# Patient Record
Sex: Male | Born: 1950 | Race: White | Hispanic: No | Marital: Single | Smoking: Former smoker
Health system: Southern US, Community
[De-identification: ages and names within clinical notes are randomized; demographics above are authoritative.]

## PROBLEM LIST (undated history)

## (undated) DIAGNOSIS — I739 Peripheral vascular disease, unspecified: Secondary | ICD-10-CM

## (undated) DIAGNOSIS — E1140 Type 2 diabetes mellitus with diabetic neuropathy, unspecified: Secondary | ICD-10-CM

## (undated) DIAGNOSIS — I10 Essential (primary) hypertension: Secondary | ICD-10-CM

---

## 2013-06-08 ENCOUNTER — Ambulatory Visit: Payer: Self-pay | Admitting: Internal Medicine

## 2013-06-11 ENCOUNTER — Emergency Department: Payer: Self-pay | Admitting: Emergency Medicine

## 2013-06-21 ENCOUNTER — Emergency Department: Payer: Self-pay | Admitting: Emergency Medicine

## 2013-06-26 ENCOUNTER — Encounter: Payer: Self-pay | Admitting: Surgery

## 2013-07-16 ENCOUNTER — Ambulatory Visit: Payer: Self-pay

## 2013-07-18 ENCOUNTER — Encounter: Payer: Self-pay | Admitting: Surgery

## 2013-08-02 ENCOUNTER — Ambulatory Visit: Payer: Self-pay | Admitting: Podiatry

## 2013-08-02 DIAGNOSIS — I10 Essential (primary) hypertension: Secondary | ICD-10-CM

## 2013-08-02 DIAGNOSIS — I2510 Atherosclerotic heart disease of native coronary artery without angina pectoris: Secondary | ICD-10-CM

## 2013-08-03 ENCOUNTER — Ambulatory Visit: Payer: Self-pay | Admitting: Podiatry

## 2013-12-25 ENCOUNTER — Ambulatory Visit: Payer: Self-pay | Admitting: Podiatry

## 2013-12-25 LAB — BASIC METABOLIC PANEL
Anion Gap: 3 — ABNORMAL LOW (ref 7–16)
BUN: 21 mg/dL — ABNORMAL HIGH (ref 7–18)
Calcium, Total: 8.4 mg/dL — ABNORMAL LOW (ref 8.5–10.1)
Chloride: 107 mmol/L (ref 98–107)
Co2: 30 mmol/L (ref 21–32)
Creatinine: 0.71 mg/dL (ref 0.60–1.30)
EGFR (African American): >60
EGFR (Non-African Amer.): >60
Glucose: 269 mg/dL — ABNORMAL HIGH (ref 65–99)
Osmolality: 292 (ref 275–301)
Potassium: 3.9 mmol/L (ref 3.5–5.1)
Sodium: 140 mmol/L (ref 136–145)

## 2013-12-25 LAB — CBC WITH DIFFERENTIAL/PLATELET
Basophil #: 0 10*3/uL (ref 0.0–0.1)
Basophil %: 0.4 %
Eosinophil #: 0.2 10*3/uL (ref 0.0–0.7)
Eosinophil %: 1.8 %
HCT: 43.7 % (ref 40.0–52.0)
HGB: 14.2 g/dL (ref 13.0–18.0)
Lymphocyte #: 1.3 10*3/uL (ref 1.0–3.6)
Lymphocyte %: 15.1 %
MCH: 31.7 pg (ref 26.0–34.0)
MCHC: 32.6 g/dL (ref 32.0–36.0)
MCV: 97 fL (ref 80–100)
Monocyte #: 0.7 x10 3/mm (ref 0.2–1.0)
Monocyte %: 8.3 %
Neutrophil #: 6.5 10*3/uL (ref 1.4–6.5)
Neutrophil %: 74.4 %
Platelet: 210 10*3/uL (ref 150–440)
RBC: 4.5 10*6/uL (ref 4.40–5.90)
RDW: 14.2 % (ref 11.5–14.5)
WBC: 8.8 10*3/uL (ref 3.8–10.6)

## 2013-12-28 ENCOUNTER — Ambulatory Visit: Payer: Self-pay | Admitting: Podiatry

## 2014-05-11 NOTE — Op Note (Signed)
PATIENT NAME:  Johnny Lane, Chrishaun MR#:  [id number] DATE OF BIRTH:  [birth year]  DATE OF PROCEDURE:  08/03/2013  PREOPERATIVE DIAGNOSIS: Right heel ulcer to bone.   POSTOPERATIVE DIAGNOSIS: Right heel ulcer to bone.   PROCEDURES: 1.  Excisional debridement, right heel ulcer to bone.  2.  Placement of wound VAC, right heel.   SURGEON: Deantae Shackleton A. Ether GriffinsFowler, DPM  ANESTHESIA: IV sedation with local.   HEMOSTASIS: Epinephrine 1:100,000 infiltrated along the wound site.   COMPLICATIONS: None.   SPECIMEN: None.   ESTIMATED BLOOD LOSS: Minimal.   OPERATIVE INDICATIONS: This is a 64 year old gentleman who has developed a right posterior lateral heel ulceration. He has undergone conservative treatment and presents today for surgical debridement. All risks, benefits, alternatives and complications associated with the surgery were discussed with the patient and full informed consent has been given.   OPERATIVE PROCEDURE: The patient was brought into the OR and placed on the operating table in the prone position. IV sedation was administered by the anesthesia team. The right lower extremity was then prepped and draped in the usual sterile fashion. Attention was directed to the posterior aspect of the right lateral heel plantarly where initially the skin was debrided with a 15 blade to remove all the hyperkeratotic tissue. Next, a VersaJet tool was used to full-thickness debride down to the heel. This was taken down to good healthy bleeding tissue. A slight amount of bone was palpable in the most central distal aspect of the debridement site. The wound measured 4 cm x 1.75 cm x 1.2 cm. Minimal bleeding was noted. Afterwards the wound bed was covered with a wound VAC at 125 mmHg. He will be discharged home with the wound VAC. Home health is set up to perform wound VAC changes. He understands to be partial weight-bearing to his toes only. I will see him back in the outpatient clinic in approximately 2 weeks.     ____________________________ Argentina DonovanJustin A. Ether GriffinsFowler, DPM jaf:sb D: 08/03/2013 08:13:47 ET T: 08/03/2013 09:01:45 ET JOB#: [id number]  cc: Jill AlexandersJustin A. Ether GriffinsFowler, DPM, <Dictator> Naeema Patlan DPM ELECTRONICALLY SIGNED 08/17/2013 7:43

## 2014-05-11 NOTE — Op Note (Signed)
PATIENT NAME:  Johnny Lane, Frantz MR#:  [id number] DATE OF BIRTH:  1950/08/04  DATE OF PROCEDURE:  12/28/2013  PREOPERATIVE DIAGNOSIS: Nonhealing diabetic foot ulcer, right lateral heel, to muscle and subcutaneous tissue.   POSTOPERATIVE DIAGNOSIS: Nonhealing diabetic foot ulcer, right lateral heel, to muscle and subcutaneous tissue.   PROCEDURE PERFORMED: 1.  Debridement of right heel to subcutaneous tissue and muscle with VersaJet.  2.  Placement of soft tissue expander for assistance with wound closure.   SURGEON: Kota Ciancio A. Ether GriffinsFowler, DPM  ANESTHESIA: IV sedation.   HEMOSTASIS: Epinephrine 1:100,000 infiltrated along the incisions and ulcer site.   COMPLICATIONS: None.   SPECIMEN: None.   ESTIMATED BLOOD LOSS: Minimal.   OPERATIVE INDICATIONS: This is a 64 year old gentleman who has had a chronic nonhealing right lateral ulcer to his heel for over 6 months now. He has undergone extensive conservative treatment and presents today for surgical debridement and placement of soft tissue expander for wound closure. The risks, benefits, alternatives, and complications associated with the procedure were discussed with the patient and full consent has been given.   OPERATIVE PROCEDURE: The patient was brought into the OR and placed on the operating table in the supine position. IV sedation was administered and the patient was placed in a lateral decubitus position. All areas were padded up appropriately. The right lower extremity was then prepped and draped in the usual sterile fashion. Attention was directed to the right posterior lateral heel where a full-thickness ulcer was noted. The ulcer measured 1.8 x 1.4 cm. It was full thickness, approximately 1.5 cm in depth, as well. At this time, the wound was sharply debrided down to muscle with the use of a VersaJet. This was taken down to good bleeding tissue. No signs of infection were noted. There was no exposure of bone at this point. After debridement  the soft tissue expander was then applied. This was a DermaClose RC soft tissue expander. Three prongs were used on both sides of the wound and the wound closure device was applied in the normal weaving pattern. This was tensioned appropriately to manufacture standards. The DermaClose device was then placed to the anterolateral aspect of the right heel. A padded dressing was applied around all areas. The DermaClose device was sutured to the lateral skin. A well compressive sterile bulky dressing was applied to the right lateral foot. He will be discharged with strict orders of nonweightbearing. We will see him back in approximately 1 week to remove the tissue expander and perform primary closure in the office.  ____________________________ Argentina DonovanJustin A. Ether GriffinsFowler, DPM jaf:sb D: 12/28/2013 08:22:41 ET T: 12/28/2013 10:39:22 ET JOB#: [id number]  cc: Jill AlexandersJustin A. Ether GriffinsFowler, DPM, <Dictator> Renate Danh DPM ELECTRONICALLY SIGNED 01/03/2014 13:07

## 2014-09-04 ENCOUNTER — Ambulatory Visit: Payer: Medicare Other | Admitting: Anesthesiology

## 2015-01-08 ENCOUNTER — Inpatient Hospital Stay: Payer: Medicare Other

## 2015-01-08 ENCOUNTER — Emergency Department: Payer: Medicare Other

## 2015-01-08 ENCOUNTER — Inpatient Hospital Stay
Admission: EM | Admit: 2015-01-08 | Discharge: 2015-01-14 | DRG: 253 | Disposition: A | Discharge status: Skilled Nursing Facility | Payer: Medicare Other | Attending: Internal Medicine | Admitting: Internal Medicine

## 2015-01-08 ENCOUNTER — Encounter: Payer: Self-pay | Admitting: *Deleted

## 2015-01-08 DIAGNOSIS — E11649 Type 2 diabetes mellitus with hypoglycemia without coma: Secondary | ICD-10-CM | POA: Diagnosis present

## 2015-01-08 DIAGNOSIS — E1140 Type 2 diabetes mellitus with diabetic neuropathy, unspecified: Secondary | ICD-10-CM | POA: Diagnosis not present

## 2015-01-08 DIAGNOSIS — E11621 Type 2 diabetes mellitus with foot ulcer: Secondary | ICD-10-CM | POA: Diagnosis not present

## 2015-01-08 DIAGNOSIS — I2510 Atherosclerotic heart disease of native coronary artery without angina pectoris: Secondary | ICD-10-CM | POA: Diagnosis not present

## 2015-01-08 DIAGNOSIS — R739 Hyperglycemia, unspecified: Secondary | ICD-10-CM | POA: Diagnosis present

## 2015-01-08 DIAGNOSIS — L8990 Pressure ulcer of unspecified site, unspecified stage: Secondary | ICD-10-CM | POA: Insufficient documentation

## 2015-01-08 DIAGNOSIS — L03116 Cellulitis of left lower limb: Secondary | ICD-10-CM | POA: Diagnosis not present

## 2015-01-08 DIAGNOSIS — M170 Bilateral primary osteoarthritis of knee: Secondary | ICD-10-CM | POA: Diagnosis present

## 2015-01-08 DIAGNOSIS — I701 Atherosclerosis of renal artery: Secondary | ICD-10-CM | POA: Diagnosis present

## 2015-01-08 DIAGNOSIS — Z23 Encounter for immunization: Secondary | ICD-10-CM | POA: Diagnosis not present

## 2015-01-08 DIAGNOSIS — N179 Acute kidney failure, unspecified: Secondary | ICD-10-CM | POA: Diagnosis not present

## 2015-01-08 DIAGNOSIS — M19041 Primary osteoarthritis, right hand: Secondary | ICD-10-CM | POA: Diagnosis present

## 2015-01-08 DIAGNOSIS — E1165 Type 2 diabetes mellitus with hyperglycemia: Secondary | ICD-10-CM | POA: Diagnosis not present

## 2015-01-08 DIAGNOSIS — Z791 Long term (current) use of non-steroidal anti-inflammatories (NSAID): Secondary | ICD-10-CM

## 2015-01-08 DIAGNOSIS — E875 Hyperkalemia: Secondary | ICD-10-CM | POA: Diagnosis not present

## 2015-01-08 DIAGNOSIS — R0602 Shortness of breath: Secondary | ICD-10-CM

## 2015-01-08 DIAGNOSIS — L97419 Non-pressure chronic ulcer of right heel and midfoot with unspecified severity: Secondary | ICD-10-CM | POA: Diagnosis not present

## 2015-01-08 DIAGNOSIS — E1122 Type 2 diabetes mellitus with diabetic chronic kidney disease: Secondary | ICD-10-CM | POA: Diagnosis present

## 2015-01-08 DIAGNOSIS — I255 Ischemic cardiomyopathy: Secondary | ICD-10-CM | POA: Diagnosis present

## 2015-01-08 DIAGNOSIS — I129 Hypertensive chronic kidney disease with stage 1 through stage 4 chronic kidney disease, or unspecified chronic kidney disease: Secondary | ICD-10-CM | POA: Diagnosis present

## 2015-01-08 DIAGNOSIS — N189 Chronic kidney disease, unspecified: Secondary | ICD-10-CM | POA: Diagnosis not present

## 2015-01-08 DIAGNOSIS — J189 Pneumonia, unspecified organism: Secondary | ICD-10-CM | POA: Diagnosis present

## 2015-01-08 DIAGNOSIS — E876 Hypokalemia: Secondary | ICD-10-CM | POA: Diagnosis not present

## 2015-01-08 DIAGNOSIS — E871 Hypo-osmolality and hyponatremia: Secondary | ICD-10-CM | POA: Diagnosis present

## 2015-01-08 DIAGNOSIS — E559 Vitamin D deficiency, unspecified: Secondary | ICD-10-CM | POA: Diagnosis not present

## 2015-01-08 DIAGNOSIS — L03115 Cellulitis of right lower limb: Secondary | ICD-10-CM | POA: Diagnosis not present

## 2015-01-08 DIAGNOSIS — R809 Proteinuria, unspecified: Secondary | ICD-10-CM | POA: Diagnosis present

## 2015-01-08 DIAGNOSIS — Z7982 Long term (current) use of aspirin: Secondary | ICD-10-CM | POA: Diagnosis not present

## 2015-01-08 DIAGNOSIS — M19042 Primary osteoarthritis, left hand: Secondary | ICD-10-CM | POA: Diagnosis not present

## 2015-01-08 DIAGNOSIS — Z7902 Long term (current) use of antithrombotics/antiplatelets: Secondary | ICD-10-CM | POA: Diagnosis not present

## 2015-01-08 DIAGNOSIS — R0902 Hypoxemia: Secondary | ICD-10-CM

## 2015-01-08 DIAGNOSIS — Z8249 Family history of ischemic heart disease and other diseases of the circulatory system: Secondary | ICD-10-CM

## 2015-01-08 DIAGNOSIS — E1152 Type 2 diabetes mellitus with diabetic peripheral angiopathy with gangrene: Principal | ICD-10-CM | POA: Diagnosis present

## 2015-01-08 DIAGNOSIS — Z794 Long term (current) use of insulin: Secondary | ICD-10-CM | POA: Diagnosis not present

## 2015-01-08 DIAGNOSIS — L0390 Cellulitis, unspecified: Secondary | ICD-10-CM | POA: Diagnosis present

## 2015-01-08 DIAGNOSIS — F17210 Nicotine dependence, cigarettes, uncomplicated: Secondary | ICD-10-CM | POA: Diagnosis not present

## 2015-01-08 HISTORY — DX: Essential (primary) hypertension: I10

## 2015-01-08 HISTORY — DX: Peripheral vascular disease, unspecified: I73.9

## 2015-01-08 HISTORY — DX: Type 2 diabetes mellitus with diabetic neuropathy, unspecified: E11.40

## 2015-01-08 LAB — GLUCOSE, CAPILLARY
GLUCOSE-CAPILLARY: 325 mg/dL — AB (ref 65–99)
GLUCOSE-CAPILLARY: 156 mg/dL — AB (ref 65–99)
GLUCOSE-CAPILLARY: 157 mg/dL — AB (ref 65–99)
GLUCOSE-CAPILLARY: 148 mg/dL — AB (ref 65–99)
Glucose-Capillary: 595 mg/dL (ref 65–99)
Glucose-Capillary: 527 mg/dL — ABNORMAL HIGH (ref 65–99)
Glucose-Capillary: 388 mg/dL — ABNORMAL HIGH (ref 65–99)
Glucose-Capillary: 233 mg/dL — ABNORMAL HIGH (ref 65–99)
Glucose-Capillary: 169 mg/dL — ABNORMAL HIGH (ref 65–99)
Glucose-Capillary: 205 mg/dL — ABNORMAL HIGH (ref 65–99)
Glucose-Capillary: 200 mg/dL — ABNORMAL HIGH (ref 65–99)

## 2015-01-08 LAB — CBC WITH DIFFERENTIAL/PLATELET
BASOS PCT: 0 %
Basophils Absolute: 0 10*3/uL (ref 0–0.1)
Eosinophils Absolute: 0 10*3/uL (ref 0–0.7)
Eosinophils Relative: 0 %
HEMATOCRIT: 42.9 % (ref 40.0–52.0)
HEMOGLOBIN: 13.5 g/dL (ref 13.0–18.0)
LYMPHS ABS: 0.4 10*3/uL — AB (ref 1.0–3.6)
LYMPHS PCT: 2 %
MCH: 29.9 pg (ref 26.0–34.0)
MCHC: 31.5 g/dL — AB (ref 32.0–36.0)
MCV: 95.1 fL (ref 80.0–100.0)
MONO ABS: 1.3 10*3/uL — AB (ref 0.2–1.0)
MONOS PCT: 6 %
NEUTROS ABS: 20.7 10*3/uL — AB (ref 1.4–6.5)
NEUTROS PCT: 92 %
Platelets: 296 10*3/uL (ref 150–440)
RBC: 4.51 MIL/uL (ref 4.40–5.90)
RDW: 14.3 % (ref 11.5–14.5)
WBC: 22.5 10*3/uL — ABNORMAL HIGH (ref 3.8–10.6)

## 2015-01-08 LAB — COMPREHENSIVE METABOLIC PANEL
ALK PHOS: 127 U/L — AB (ref 38–126)
ALT: 14 U/L — ABNORMAL LOW (ref 17–63)
ANION GAP: 11 (ref 5–15)
AST: 14 U/L — ABNORMAL LOW (ref 15–41)
Albumin: 2.2 g/dL — ABNORMAL LOW (ref 3.5–5.0)
BILIRUBIN TOTAL: 2.1 mg/dL — AB (ref 0.3–1.2)
BUN: 19 mg/dL (ref 6–20)
CO2: 30 mmol/L (ref 22–32)
Calcium: 7.2 mg/dL — ABNORMAL LOW (ref 8.9–10.3)
Chloride: 87 mmol/L — ABNORMAL LOW (ref 101–111)
Creatinine, Ser: 0.91 mg/dL (ref 0.61–1.24)
GFR calc Af Amer: >60 mL/min (ref >60)
GLUCOSE: 833 mg/dL — AB (ref 65–99)
Potassium: 5 mmol/L (ref 3.5–5.1)
Sodium: 128 mmol/L — ABNORMAL LOW (ref 135–145)
Total Protein: 6 g/dL — ABNORMAL LOW (ref 6.5–8.1)

## 2015-01-08 LAB — ETHANOL: Alcohol, Ethyl (B): <5 mg/dL (ref <5)

## 2015-01-08 LAB — MRSA PCR SCREENING: MRSA by PCR: NEGATIVE

## 2015-01-08 LAB — HEMOGLOBIN A1C: Hgb A1c MFr Bld: 13.8 % — ABNORMAL HIGH (ref 4.0–6.0)

## 2015-01-08 MED ORDER — SODIUM CHLORIDE 0.9 % IV SOLN
Freq: Once | INTRAVENOUS | Status: AC
  Administered 2015-01-08: 12:00:00 via INTRAVENOUS

## 2015-01-08 MED ORDER — INSULIN ASPART 100 UNIT/ML ~~LOC~~ SOLN: 0.0000 [IU] | Freq: Every day | SUBCUTANEOUS | Status: DC

## 2015-01-08 MED ORDER — SENNOSIDES-DOCUSATE SODIUM 8.6-50 MG PO TABS
1.0000 | ORAL_TABLET | Freq: Every day | ORAL | Status: DC
  Administered 2015-01-08 – 2015-01-14 (×7): 1 via ORAL
  Filled 2015-01-08 (×7): qty 1

## 2015-01-08 MED ORDER — ENOXAPARIN SODIUM 40 MG/0.4ML ~~LOC~~ SOLN
40.0000 mg | SUBCUTANEOUS | Status: DC
  Administered 2015-01-08: 40 mg via SUBCUTANEOUS
  Filled 2015-01-08: qty 0.4

## 2015-01-08 MED ORDER — INFLUENZA VAC SPLIT QUAD 0.5 ML IM SUSY
0.5000 mL | PREFILLED_SYRINGE | INTRAMUSCULAR | Status: AC
  Administered 2015-01-09: 0.5 mL via INTRAMUSCULAR
  Filled 2015-01-08: qty 0.5

## 2015-01-08 MED ORDER — ADULT MULTIVITAMIN W/MINERALS CH
1.0000 | ORAL_TABLET | Freq: Every day | ORAL | Status: DC
  Administered 2015-01-08 – 2015-01-14 (×7): 1 via ORAL
  Filled 2015-01-08 (×7): qty 1

## 2015-01-08 MED ORDER — VITAMIN B-1 100 MG PO TABS
100.0000 mg | ORAL_TABLET | Freq: Every day | ORAL | Status: DC
  Administered 2015-01-08 – 2015-01-14 (×7): 100 mg via ORAL
  Filled 2015-01-08 (×7): qty 1

## 2015-01-08 MED ORDER — ONDANSETRON HCL 4 MG/2ML IJ SOLN
4.0000 mg | Freq: Four times a day (QID) | INTRAMUSCULAR | Status: DC | PRN
Start: 2015-01-08 — End: 2015-01-14

## 2015-01-08 MED ORDER — PIPERACILLIN-TAZOBACTAM 3.375 G IVPB
3.3750 g | Freq: Once | INTRAVENOUS | Status: AC
  Administered 2015-01-08: 3.375 g via INTRAVENOUS
  Filled 2015-01-08: qty 50

## 2015-01-08 MED ORDER — SODIUM CHLORIDE 0.9 % IV BOLUS (SEPSIS): 500.0000 mL | Freq: Once | INTRAVENOUS | Status: DC

## 2015-01-08 MED ORDER — ASPIRIN EC 81 MG PO TBEC
81.0000 mg | DELAYED_RELEASE_TABLET | Freq: Every day | ORAL | Status: DC
  Administered 2015-01-08 – 2015-01-09 (×2): 81 mg via ORAL
  Filled 2015-01-08 (×2): qty 1

## 2015-01-08 MED ORDER — ACETAMINOPHEN 650 MG RE SUPP: 650.0000 mg | Freq: Four times a day (QID) | RECTAL | Status: DC | PRN

## 2015-01-08 MED ORDER — CETYLPYRIDINIUM CHLORIDE 0.05 % MT LIQD
7.0000 mL | Freq: Two times a day (BID) | OROMUCOSAL | Status: DC
  Administered 2015-01-08 – 2015-01-14 (×12): 7 mL via OROMUCOSAL

## 2015-01-08 MED ORDER — SODIUM CHLORIDE 0.9 % IV SOLN
INTRAVENOUS | Status: DC
  Administered 2015-01-08: 5.4 [IU]/h via INTRAVENOUS
  Filled 2015-01-08: qty 2.5

## 2015-01-08 MED ORDER — VANCOMYCIN HCL IN DEXTROSE 1-5 GM/200ML-% IV SOLN
1000.0000 mg | Freq: Once | INTRAVENOUS | Status: AC
  Administered 2015-01-08: 1000 mg via INTRAVENOUS
  Filled 2015-01-08: qty 200

## 2015-01-08 MED ORDER — FOLIC ACID 1 MG PO TABS
1.0000 mg | ORAL_TABLET | Freq: Every day | ORAL | Status: DC
  Administered 2015-01-08 – 2015-01-14 (×7): 1 mg via ORAL
  Filled 2015-01-08 (×7): qty 1

## 2015-01-08 MED ORDER — VANCOMYCIN HCL IN DEXTROSE 1-5 GM/200ML-% IV SOLN
1000.0000 mg | Freq: Two times a day (BID) | INTRAVENOUS | Status: DC
  Administered 2015-01-08 – 2015-01-09 (×2): 1000 mg via INTRAVENOUS
  Filled 2015-01-08 (×4): qty 200

## 2015-01-08 MED ORDER — ALUM & MAG HYDROXIDE-SIMETH 200-200-20 MG/5ML PO SUSP: 30.0000 mL | Freq: Four times a day (QID) | ORAL | Status: DC | PRN

## 2015-01-08 MED ORDER — THIAMINE HCL 100 MG/ML IJ SOLN: 100.0000 mg | Freq: Every day | INTRAMUSCULAR | Status: DC

## 2015-01-08 MED ORDER — SODIUM CHLORIDE 0.9 % IV BOLUS (SEPSIS)
1000.0000 mL | Freq: Once | INTRAVENOUS | Status: AC
  Administered 2015-01-08: 1000 mL via INTRAVENOUS

## 2015-01-08 MED ORDER — LORAZEPAM 1 MG PO TABS: 1.0000 mg | ORAL_TABLET | Freq: Four times a day (QID) | ORAL | Status: AC | PRN

## 2015-01-08 MED ORDER — LORAZEPAM 2 MG PO TABS: 0.0000 mg | ORAL_TABLET | Freq: Two times a day (BID) | ORAL | Status: DC

## 2015-01-08 MED ORDER — INSULIN ASPART 100 UNIT/ML ~~LOC~~ SOLN
10.0000 [IU] | Freq: Once | SUBCUTANEOUS | Status: AC
  Administered 2015-01-08: 10 [IU] via INTRAVENOUS
  Filled 2015-01-08: qty 10

## 2015-01-08 MED ORDER — SODIUM CHLORIDE 0.9 % IV SOLN
INTRAVENOUS | Status: DC
  Administered 2015-01-08 – 2015-01-12 (×7): via INTRAVENOUS

## 2015-01-08 MED ORDER — GABAPENTIN 300 MG PO CAPS
300.0000 mg | ORAL_CAPSULE | Freq: Three times a day (TID) | ORAL | Status: DC
  Administered 2015-01-08 – 2015-01-12 (×12): 300 mg via ORAL
  Filled 2015-01-08 (×13): qty 1

## 2015-01-08 MED ORDER — MENTHOL 3 MG MT LOZG
1.0000 | LOZENGE | OROMUCOSAL | Status: DC | PRN
  Filled 2015-01-08: qty 9

## 2015-01-08 MED ORDER — INSULIN ASPART 100 UNIT/ML ~~LOC~~ SOLN: 0.0000 [IU] | Freq: Three times a day (TID) | SUBCUTANEOUS | Status: DC

## 2015-01-08 MED ORDER — LORAZEPAM 2 MG PO TABS: 0.0000 mg | ORAL_TABLET | Freq: Four times a day (QID) | ORAL | Status: DC

## 2015-01-08 MED ORDER — MORPHINE SULFATE (PF) 2 MG/ML IV SOLN: 1.0000 mg | INTRAVENOUS | Status: DC | PRN

## 2015-01-08 MED ORDER — METFORMIN HCL 500 MG PO TABS
1000.0000 mg | ORAL_TABLET | Freq: Every day | ORAL | Status: DC
  Administered 2015-01-09: 1000 mg via ORAL
  Filled 2015-01-08: qty 2

## 2015-01-08 MED ORDER — GLIPIZIDE 5 MG PO TABS
10.0000 mg | ORAL_TABLET | Freq: Two times a day (BID) | ORAL | Status: DC
  Administered 2015-01-08 – 2015-01-09 (×2): 10 mg via ORAL
  Filled 2015-01-08 (×2): qty 2

## 2015-01-08 MED ORDER — INSULIN GLARGINE 100 UNIT/ML ~~LOC~~ SOLN
60.0000 [IU] | Freq: Every day | SUBCUTANEOUS | Status: DC
  Administered 2015-01-08 – 2015-01-11 (×4): 60 [IU] via SUBCUTANEOUS
  Filled 2015-01-08 (×7): qty 0.6

## 2015-01-08 MED ORDER — LORAZEPAM 2 MG/ML IJ SOLN: 1.0000 mg | Freq: Four times a day (QID) | INTRAMUSCULAR | Status: AC | PRN

## 2015-01-08 MED ORDER — LISINOPRIL 10 MG PO TABS
10.0000 mg | ORAL_TABLET | Freq: Every day | ORAL | Status: DC
  Administered 2015-01-08 – 2015-01-09 (×2): 10 mg via ORAL
  Filled 2015-01-08 (×2): qty 1

## 2015-01-08 MED ORDER — ATORVASTATIN CALCIUM 20 MG PO TABS
80.0000 mg | ORAL_TABLET | Freq: Every day | ORAL | Status: DC
Start: 2015-01-08 — End: 2015-01-14
  Administered 2015-01-08 – 2015-01-13 (×6): 80 mg via ORAL
  Filled 2015-01-08 (×6): qty 4

## 2015-01-08 MED ORDER — BISACODYL 5 MG PO TBEC: 5.0000 mg | DELAYED_RELEASE_TABLET | Freq: Every day | ORAL | Status: DC | PRN

## 2015-01-08 MED ORDER — ONDANSETRON HCL 4 MG PO TABS: 4.0000 mg | ORAL_TABLET | Freq: Four times a day (QID) | ORAL | Status: DC | PRN

## 2015-01-08 MED ORDER — SENNOSIDES-DOCUSATE SODIUM 8.6-50 MG PO TABS: 1.0000 | ORAL_TABLET | Freq: Every evening | ORAL | Status: DC | PRN

## 2015-01-08 MED ORDER — HYDROCODONE-ACETAMINOPHEN 5-325 MG PO TABS: 1.0000 | ORAL_TABLET | ORAL | Status: DC | PRN

## 2015-01-08 MED ORDER — PIPERACILLIN-TAZOBACTAM 3.375 G IVPB
3.3750 g | Freq: Three times a day (TID) | INTRAVENOUS | Status: DC
  Administered 2015-01-08 – 2015-01-12 (×13): 3.375 g via INTRAVENOUS
  Filled 2015-01-08 (×14): qty 50

## 2015-01-08 MED ORDER — CLOPIDOGREL BISULFATE 75 MG PO TABS
75.0000 mg | ORAL_TABLET | Freq: Every day | ORAL | Status: DC
  Administered 2015-01-08 – 2015-01-09 (×2): 75 mg via ORAL
  Filled 2015-01-08 (×3): qty 1

## 2015-01-08 MED ORDER — ACETAMINOPHEN 325 MG PO TABS
650.0000 mg | ORAL_TABLET | Freq: Four times a day (QID) | ORAL | Status: DC | PRN
Start: 2015-01-08 — End: 2015-01-14
  Administered 2015-01-08 – 2015-01-09 (×2): 650 mg via ORAL
  Filled 2015-01-08 (×2): qty 2

## 2015-01-08 NOTE — ED Notes (Signed)
Pt o2 sat 83-88% on RA, pt placed on 2L [place], MD ntoified

## 2015-01-08 NOTE — ED Notes (Signed)
Dr. Inocencio HomesGayle notified of critical glucose of 833

## 2015-01-08 NOTE — Clinical Social Work Note (Signed)
LCSW printed off and will provide patient with transportation information brochure for medicaid transport and for [employer] transportation resource and fees for non medical appointments in the  [place] area.  Hanna Ra LCSW

## 2015-01-08 NOTE — Consult Note (Addendum)
ORTHOPAEDIC CONSULTATION  REQUESTING PHYSICIAN: Adrian SaranSital Mody, MD  Chief Complaint: Left leg cellulits and weakness.  HPI: Johnny RastDavid Lane is a 64 y.o. male who complains of  Left leg cellulits and pain. Worsening pain and swelling over last week.  Home health nurse evaluating and asking pt to go to ER.  Very weak and transported to ED via ambulance.  Admitted to ICU.  Found to have areas of necrosis on left foot great toe and lesser toes.  Pt with known hx of PVD s/p revascularization, DM, Neuropathy and heavy smoker.  Past Medical History  Diagnosis Date  . Diabetes mellitus with neuropathy (HCC)   . Benign essential HTN   . PVD (peripheral vascular disease) (HCC)    History reviewed. No pertinent past surgical history. Social History   Social History  . Marital Status: Single    Spouse Name: N/A  . Number of Children: N/A  . Years of Education: N/A   Social History Main Topics  . Smoking status: Former Games developermoker  . Smokeless tobacco: None  . Alcohol Use: None  . Drug Use: None  . Sexual Activity: Not Asked   Other Topics Concern  . None   Social History Narrative  . None   History reviewed. No pertinent family history. No Known Allergies Prior to Admission medications   Medication Sig Start Date End Date Taking? Authorizing Provider  albuterol (ACCUNEB) 0.63 MG/3ML nebulizer solution Inhale 3 mLs into the lungs every 6 (six) hours as needed for wheezing or shortness of breath.    Yes Historical Provider, MD  aspirin EC 81 MG tablet Take 81 mg by mouth daily.   Yes Historical Provider, MD  atorvastatin (LIPITOR) 80 MG tablet Take 80 mg by mouth at bedtime.   Yes Historical Provider, MD  clopidogrel (PLAVIX) 75 MG tablet Take 75 mg by mouth daily.   Yes Historical Provider, MD  gabapentin (NEURONTIN) 300 MG capsule Take 300 mg by mouth 3 (three) times daily.   Yes Historical Provider, MD  glipiZIDE (GLUCOTROL) 10 MG tablet Take 10 mg by mouth 2 (two) times daily before a meal.    Yes Historical Provider, MD  insulin glargine (LANTUS) 100 UNIT/ML injection Inject 60 Units into the skin at bedtime.   Yes Historical Provider, MD  insulin lispro (HUMALOG) 100 UNIT/ML injection Inject 22 Units into the skin 3 (three) times daily before meals.   Yes Historical Provider, MD  lisinopril (PRINIVIL,ZESTRIL) 10 MG tablet Take 10 mg by mouth daily.   Yes Historical Provider, MD  metFORMIN (GLUCOPHAGE) 500 MG tablet Take 1,000 mg by mouth daily with breakfast.   Yes Historical Provider, MD  naproxen (NAPROSYN) 500 MG tablet Take 500 mg by mouth 2 (two) times daily with a meal.   Yes Historical Provider, MD  senna-docusate (SENOKOT-S) 8.6-50 MG tablet Take 1 tablet by mouth daily.   Yes Historical Provider, MD   Dg Chest 1 View  01/08/2015  CLINICAL DATA:  Weakness for the last few weeks. Hypertension, diabetes and smoking history. EXAM: CHEST 1 VIEW COMPARISON:  None. FINDINGS: The heart is at the upper limits of normal in size. The left lung is clear. On the right, there is a pleural effusion with abnormal density in the right lower lung that could be atelectasis and/or pneumonia. No significant bone finding. IMPRESSION: Right effusion. Abnormal density in the right lower lobe that could be atelectasis and/or pneumonia. Electronically Signed   By: Paulina FusiMark  Shogry M.D.   On: 01/08/2015 11:23  Dg Foot Complete Left  01/08/2015  CLINICAL DATA:  Spider bite 10 days ago on the fourth toe. Regional inflammation and drainage. EXAM: LEFT FOOT - COMPLETE 3+ VIEW COMPARISON:  None. FINDINGS: The bones are diffusely osteopenic. No evidence of fracture. No identifiable focal lesion. There is regional arterial calcification but no sign of radiopaque foreign object. IMPRESSION: Diffuse osteopenia.  No focal finding. Electronically Signed   By: Paulina Fusi M.D.   On: 01/08/2015 09:25    Positive ROS: All other systems have been reviewed and were otherwise negative with the exception of those  mentioned in the HPI and as above.  12 point ROS was performed.  Physical Exam: General: Alert and oriented.  No apparent distress.  Vascular:  Left foot:Dorsalis Pedis:  absent Posterior Tibial:  absent  Right foot: Dorsalis Pedis:  absent Posterior Tibial:  absent  Neuro:absent protective sensation   Derm:  Right heel with healing ulcer to lateral heel.  Right foot well perfused and stable.  Left entire lower extremity with cellulitis.  Areas of necrosis to left 3rd and 4th toes.  Necrotic tissue to medial left 1st ray.  Bullae medial and plantar arch.  Mild purulence and serrous fluid.  Heel with blister and pre-necrotic pressure ulceration as well.  Ortho/MS: Diffuse edema left lower leg.  Diffuse pain  Lab Results  Component Value Date   WBC 22.5* 01/08/2015   HGB 13.5 01/08/2015   HCT 42.9 01/08/2015   MCV 95.1 01/08/2015   PLT 296 01/08/2015      Assessment: Severe cellulitis left leg with PVD and necrosis to distal left foot.  Plan: Culture performed to bullous lesion medial. Will most likely need revascularization prior to any further podiatry intervention.  Vascular has been consulted. IV abx per medicine at this time.   Consider MRI if not improving with abx. Heel pressure reducing pads applied.  To be changed daily.  Dressing orders modified to daily changes. Will follow.    Irean Hong, DPM Cell [phone]   01/08/2015 4:17 PM

## 2015-01-08 NOTE — ED Notes (Signed)
Pt arrives via EMS from home with complaints of a spider bite 10 days ago on left 4th toe, pt being treated for wound on both legs, arrives with both feet wrapped, left leg red up shin, hot, drainage, MD at bedside

## 2015-01-08 NOTE — Consult Note (Signed)
WOC wound consult note Reason for Consult: Vascular/neuropathic mixed etiology ulcers to right heel (resolving), necrosis to left second and third metatarsal, blistering to left plantar and medial aspect of foot.  Great toe has intact blood filled blistering along the medial aspect.  [facility] who encouraged [facility] to report to hospital.  Cellulitis present to left and right lower extremities.  Wound type:Mixed neuropathic and vascular ulcers with resulting cellulitis Pressure Ulcer POA: Yes Measurement:Right heel 2 cm x 2 cm intact calloused lesion Left great toe 3.2 cm x 1.2 cm intact blood filled blister Blistering, peeling epithelium to left plantar and medial foot Third metatarsal 0.5 cm eschar to dorsal aspect Wound ZOX:WRUEAVWbed:Peeling epithelium Drainage (amount, consistency, odor) Minimal serosanguinous Periwound:Erythema and edema Dressing procedure/placement/frequency:Cleanse ulcer to right heel and left foot ulcerations with soap and water and pat gently dry.  Apply Mepitel silicone contact layer to wound bed.  Cover with 4x4 gauze and kerlix/tape.  Change Monday/Wednesday/Friday. Will not follow at this time.  Please re-consult if needed.  Maple HudsonKaren Jala Dundon RN BSN CWON Pager [phone]

## 2015-01-08 NOTE — ED Notes (Signed)
Alwyn RenLarry Stinson, [facility] to [facility], [phone] ext [phone]

## 2015-01-08 NOTE — Progress Notes (Signed)
ANTIBIOTIC CONSULT NOTE - INITIAL  Pharmacy Consult for Vancomycin and Zosyn Indication: Celluitis  No Known Allergies  Patient Measurements: Height: 5\' 8"  (172.7 cm) Weight: 180 lb (81.647 kg) IBW/kg (Calculated) : 68.4  Vital Signs: Temp: 98 F (36.7 C) (12/21 1150) Temp Source: Oral (12/21 1150) BP: 118/58 mmHg (12/21 1150) Pulse Rate: 89 (12/21 1150) Intake/Output from previous day:   Intake/Output from this shift:    Labs:  Recent Labs  01/08/15 0846  WBC 22.5*  HGB 13.5  PLT 296  CREATININE 0.91   Estimated Creatinine Clearance: 77.3 mL/min (by C-G formula based on Cr of 0.91). No results for input(s): VANCOTROUGH, VANCOPEAK, VANCORANDOM, GENTTROUGH, GENTPEAK, GENTRANDOM, TOBRATROUGH, TOBRAPEAK, TOBRARND, AMIKACINPEAK, AMIKACINTROU, AMIKACIN in the last 72 hours.   Microbiology: Recent Results (from the past 720 hour(s))  Blood culture (routine x 2)     Status: None (Preliminary result)   Collection Time: 01/08/15  8:46 AM  Result Value Ref Range Status   Specimen Description BLOOD LEFT ASSIST CONTROL  Final   Special Requests   Final    BOTTLES DRAWN AEROBIC AND ANAEROBIC  5CC AERO 2CC ANAERO   Culture NO GROWTH < 12 HOURS  Final   Report Status PENDING  Incomplete  Blood culture (routine x 2)     Status: None (Preliminary result)   Collection Time: 01/08/15  9:29 AM  Result Value Ref Range Status   Specimen Description BLOOD RIGHT ARM  Final   Special Requests BOTTLES DRAWN AEROBIC AND ANAEROBIC  1CC  Final   Culture NO GROWTH < 12 HOURS  Final   Report Status PENDING  Incomplete    Medical History: Past Medical History  Diagnosis Date  . Diabetes mellitus with neuropathy (HCC)   . Benign essential HTN   . PVD (peripheral vascular disease) (HCC)     Medications:  Scheduled:  . aspirin EC  81 mg Oral Daily  . atorvastatin  80 mg Oral QHS  . clopidogrel  75 mg Oral Daily  . enoxaparin (LOVENOX) injection  40 mg Subcutaneous Q24H  . folic acid   1 mg Oral Daily  . gabapentin  300 mg Oral TID  . glipiZIDE  10 mg Oral BID AC  . insulin glargine  60 Units Subcutaneous QHS  . lisinopril  10 mg Oral Daily  . [START ON 01/09/2015] metFORMIN  1,000 mg Oral Q breakfast  . multivitamin with minerals  1 tablet Oral Daily  . piperacillin-tazobactam (ZOSYN)  IV  3.375 g Intravenous Once  . piperacillin-tazobactam (ZOSYN)  IV  3.375 g Intravenous 3 times per day  . senna-docusate  1 tablet Oral Daily  . thiamine  100 mg Oral Daily   Or  . thiamine  100 mg Intravenous Daily  . vancomycin  1,000 mg Intravenous Q12H   Infusions:  . sodium chloride    . insulin (NOVOLIN-R) infusion 5.4 Units/hr (01/08/15 1144)   Assessment: 64 y/o M with LLE cellulitis without evidence of osteomyelitis ordered empiric abx.   Goal of Therapy:  Vancomycin trough level 10-15 mcg/ml  Plan:  Zosyn 3.375 g iv once then 3.375 g EI q 8 hours beginning 5 hours after initial dose.   Vancomycin 1000 mg iv q 12 hours with stacked dosing and a trough with the 5th total dose.   Will continue to follow renal function and culture results.   Luisa HartChristy, Raina Sole D 01/08/2015,1:01 PM

## 2015-01-08 NOTE — H&P (Signed)
[facility] [place] [facility]   PATIENT NAME: Johnny Lane Smigiel    MR#:  [id number]  DATE OF BIRTH:  01/13/1951  DATE OF ADMISSION:  01/08/2015  PRIMARY CARE PHYSICIAN: Leotis ShamesSingh,[name], MD   REQUESTING/REFERRING PHYSICIAN: [id number]  CHIEF COMPLAINT:  Infection of left foot  HISTORY OF PRESENT ILLNESS:  Johnny Lane Sherry  is a 64 y.o. male with a known history of peripheral vascular disease, diabetes with neuropathy and essential hypertension who presents with above complaint. Patient reports he has been seen by wound care. The wound care nurse apparently sent him over here for concerns of his left foot. The left foot appears that he has necrotic toes and a large area of cellulitis. Patient reports that the nurse thought that the patient may have had a spider bite.  PAST MEDICAL HISTORY:   Past Medical History  Diagnosis Date  . Diabetes mellitus with neuropathy (HCC)   . 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  EYES: No blurred or double vision.  EARS, NOSE, AND THROAT: No tinnitus or ear pain.  RESPIRATORY: No cough, shortness of breath, wheezing or hemoptysis.  CARDIOVASCULAR: No chest pain, orthopnea, edema.  GASTROINTESTINAL: No nausea, vomiting, diarrhea or abdominal pain.  GENITOURINARY: No dysuria, hematuria.  ENDOCRINE: No polyuria, nocturia,  HEMATOLOGY: No  anemia, easy bruising or bleeding SKIN: Positive bilateral erythema lower extremity with black toes left foot MUSCULOSKELETAL: No joint pain or arthritis.   NEUROLOGIC: No tingling, numbness, weakness.  PSYCHIATRY: No anxiety or depression.   MEDICATIONS AT HOME:   Prior to Admission medications   Medication Sig Start Date End Date Taking? Authorizing Provider  albuterol (ACCUNEB) 0.63 MG/3ML nebulizer solution Inhale 3 mLs into the lungs every 6 (six) hours as needed for wheezing or shortness of breath.    Yes Historical Provider, MD  aspirin EC 81 MG tablet Take 81 mg by mouth daily.   Yes Historical Provider, MD  atorvastatin (LIPITOR) 80 MG tablet Take 80 mg by mouth at bedtime.   Yes Historical Provider, MD  clopidogrel (PLAVIX) 75 MG tablet Take 75 mg by mouth daily.   Yes Historical Provider, MD  gabapentin (NEURONTIN) 300 MG capsule Take 300 mg by mouth 3 (three) times daily.   Yes Historical Provider, MD  glipiZIDE (GLUCOTROL) 10 MG tablet Take 10 mg by mouth 2 (two) times daily before a meal.   Yes Historical Provider, MD  insulin glargine (LANTUS) 100 UNIT/ML injection Inject 60 Units into the skin at bedtime.   Yes Historical Provider, MD  insulin lispro (HUMALOG) 100 UNIT/ML injection Inject 22 Units into the skin 3 (three) times daily before meals.   Yes Historical Provider, MD  lisinopril (PRINIVIL,ZESTRIL) 10 MG tablet Take 10 mg by mouth daily.   Yes Historical Provider, MD  metFORMIN (GLUCOPHAGE) 500 MG tablet Take 1,000 mg by mouth  daily with breakfast.   Yes Historical Provider, MD  naproxen (NAPROSYN) 500 MG tablet Take 500 mg by mouth 2 (two) times daily with a meal.   Yes Historical Provider, MD  senna-docusate (SENOKOT-S) 8.6-50 MG tablet Take 1 tablet by mouth daily.   Yes Historical Provider, MD      VITAL SIGNS:  Blood pressure 134/62, pulse 82, temperature 98.8 F (37.1 C), temperature source Oral, resp. rate 21, height  (1.727 m), weight 81.647 kg (180  lb), SpO2 96 %.  PHYSICAL EXAMINATION:  GENERAL:  64 y.o.-year-old patient lying in the bed with no acute distress.  EYES: Pupils equal, round, reactive to light and accommodation. No scleral icterus. Extraocular muscles intact.  HEENT: Head atraumatic, normocephalic. Oropharynx and nasopharynx clear.  NECK:  Supple, no jugular venous distention. No thyroid enlargement, no tenderness.  LUNGS: Normal breath sounds bilaterally, decreased  breath sounds bilaterally without  wheezing, rales,rhonchi or crepitation. No use of accessory muscles of respiration.  CARDIOVASCULAR: S1, S2 normal. No murmurs, rubs, or gallops.  ABDOMEN: Soft, nontender, nondistended. Bowel sounds present. No organomegaly or mass.  EXTREMITIES: No pedal edema, cyanosis, or clubbing.  NEUROLOGIC: Cranial nerves II through XII are grossly intact. No focal deficits. PSYCHIATRIC: The patient is alert and oriented x 3.  SKIN: Both lower extremities with erythema left greater than right. Both legs are very tender to touch especially the left leg. Left leg with blisters. Left foot with 2 necrotic toes. Blister and scaling of left foot. Right heel with chronic changes no purulent discharge   LABORATORY PANEL:   CBC  Recent Labs Lab 01/08/15 0846  WBC 22.5*  HGB 13.5  HCT 42.9  PLT 296   ------------------------------------------------------------------------------------------------------------------  Chemistries   Recent Labs Lab 01/08/15 0846  NA 128*  K 5.0  CL 87*  CO2 30  GLUCOSE 833*  BUN 19  CREATININE 0.91  CALCIUM 7.2*  AST 14*  ALT 14*  ALKPHOS 127*  BILITOT 2.1*   ------------------------------------------------------------------------------------------------------------------  Cardiac Enzymes No results for input(s): TROPONINI in the last 168 hours. ------------------------------------------------------------------------------------------------------------------  RADIOLOGY:  Dg Foot  Complete Left  01/08/2015  CLINICAL DATA:  Spider bite 10 days ago on the fourth toe. Regional inflammation and drainage. EXAM: LEFT FOOT - COMPLETE 3+ VIEW COMPARISON:  None. FINDINGS: The bones are diffusely osteopenic. No evidence of fracture. No identifiable focal lesion. There is regional arterial calcification but no sign of radiopaque foreign object. IMPRESSION: Diffuse osteopenia.  No focal finding. Electronically Signed   By: Paulina Fusi M.D.   On: 01/08/2015 09:25    EKG:   Sinus rhythm no ST elevation or depression   IMPRESSION AND PLAN:    64 year old male with a history of peripheral vascular disease, diabetes with neuropathy and essential hypertension who presents with bilateral erythema of lower extremities and necrotic toes of left foot.    1. Cellulitis with necrosis of toes on left foot: Patient was started on Zosyn and vancomycin in the emergency room which I will continue. Blood cultures have already been ordered. X-ray does not show evidence of osteomyelitis. Patient will need podiatry and vascular consultation. Patient may need to undergo MRI. I will also obtain wound care consultation.   2. Uncontrolled diabetes: Blood sugars are very elevated in part due to the cellulitis. Patient will need an insulin drip for better control of blood sugars. I will closely monitor blood sugars and restart his outpatient medications. Continue IV fluids. Diabetes coordinator has been consulted. Patient will be  placed on high-dose sliding scale insulin. Continue glipizide and metformin. 3. Essential hypertension: Continue lisinopril. He reports he does not take metoprolol as prescribed to him due to side effects. Monitor blood pressure and if elevated then I would discuss with him the need to restart metoprolol and Imdur 4. CAD: Continue aspirin, atorvastatin and Plavix. He does not take metoprolol and imdur due to side effects.   5. Ischemic cardiomyopathy EF 35%: Patient states he does  not take Lasix due to the side effect of urinating frequently. Watch his fluid status. I will restart Lasix during this hospitalization as he is on fluids for his hyperglycemia. Continue lisinopril. Encouraged patient to take metoprolol.  6. Tobacco dependence: Patient states he is trying to quit smoking. Quit back in 2015 he sometimes slips up. Patient is encouraged to continue to stop smoking. Patient counseled 3 minutes.  7. EtOH abuse: Patient reports he drinks 1-2 beers a day. He reports he has not had withdrawal. Monitor for signs of withdrawal. CIWA protocol has been ordered.   All the records are reviewed and case discussed with ED provider. Management plans discussed with the patient and he is in agreement.  CODE STATUS: DNR  TOTAL TIME TAKING CARE OF THIS PATIENT: 50 minutes.    Antjuan Rothe M.D on 01/08/2015 at 10:33 AM  Between 7am to 6pm - Pager - [phone] After 6pm go to [email or web address] - password [email or web address]  [facility]  Office  [phone]  CC: Primary care physician; Leotis Shames, MD

## 2015-01-08 NOTE — ED Provider Notes (Signed)
[facility] Emergency Department Provider Note  ____________________________________________  Time seen: Approximately 8:35 AM  I have reviewed the triage vital signs and the nursing notes.   HISTORY  Chief Complaint Insect Bite    HPI Johnny Lane is a 64 y.o. male with  diabetes, peripheral artery disease with chronic foot wounds of bilateral lower extremities followed by podiatry presents for evaluation of worsening left foot redness and swelling, gradual onset, ongoing for at least 10 days, constant since onset, currently severe. The patient believes that it "spider bit me" on the left fourth toe and that that has been the cause of his leg swelling. He is followed by home health nurse. They evaluated him yesterday, noted that he was having fevers and chills, high blood sugars, black discoloration of the third and fourth toe and advised him to come to the emergency department but he refused. He has chronically poor sensation in both feet due to diabetic neuropathy. He has had no vomiting or diarrhea, no chest pain or difficulty breathing.   Past Medical History  Diagnosis Date  . Diabetes mellitus without complication (HCC)     There are no active problems to display for this patient.   History reviewed. No pertinent past surgical history.  Current Outpatient Rx  Name  Route  Sig  Dispense  Refill  . albuterol (ACCUNEB) 0.63 MG/3ML nebulizer solution   Inhalation   Inhale 3 mLs into the lungs every 6 (six) hours as needed for wheezing or shortness of breath.          Marland Kitchen aspirin EC 81 MG tablet   Oral   Take 81 mg by mouth daily.         Marland Kitchen atorvastatin (LIPITOR) 80 MG tablet   Oral   Take 80 mg by mouth at bedtime.         . clopidogrel (PLAVIX) 75 MG tablet   Oral   Take 75 mg by mouth daily.         Marland Kitchen gabapentin (NEURONTIN) 300 MG capsule   Oral   Take 300 mg by mouth 3 (three) times daily.         Marland Kitchen glipiZIDE (GLUCOTROL) 10 MG  tablet   Oral   Take 10 mg by mouth 2 (two) times daily before a meal.         . insulin glargine (LANTUS) 100 UNIT/ML injection   Subcutaneous   Inject 60 Units into the skin at bedtime.         . insulin lispro (HUMALOG) 100 UNIT/ML injection   Subcutaneous   Inject 22 Units into the skin 3 (three) times daily before meals.         Marland Kitchen lisinopril (PRINIVIL,ZESTRIL) 10 MG tablet   Oral   Take 10 mg by mouth daily.         . metFORMIN (GLUCOPHAGE) 500 MG tablet   Oral   Take 1,000 mg by mouth daily with breakfast.         . naproxen (NAPROSYN) 500 MG tablet   Oral   Take 500 mg by mouth 2 (two) times daily with a meal.         . senna-docusate (SENOKOT-S) 8.6-50 MG tablet   Oral   Take 1 tablet by mouth daily.           Allergies Review of patient's allergies indicates no known allergies.  History reviewed. No pertinent family history.  Social History Social History  Substance Use  Topics  . Smoking status: Former Games developermoker  . Smokeless tobacco: None  . Alcohol Use: None    Review of Systems Constitutional: + subjective fever/chills Eyes: No visual changes. ENT: No sore throat. Cardiovascular: Denies chest pain. Respiratory: Denies shortness of breath. Gastrointestinal: No abdominal pain.  No nausea, no vomiting.  No diarrhea.  No constipation. Genitourinary: Negative for dysuria. Musculoskeletal: Negative for back pain. Skin: Negative for rash. Neurological: Negative for headaches, focal weakness or numbness.  10-point ROS otherwise negative.  ____________________________________________   PHYSICAL EXAM:  Filed Vitals:   01/08/15 0843 01/08/15 0919 01/08/15 0930 01/08/15 1000  BP: 131/69  153/94 134/62  Pulse: 84  92 82  Temp: 98.8 F (37.1 C)     TempSrc: Oral     Resp: 18  21 21   Height: 5\' 8"  (1.727 m)     Weight: 180 lb (81.647 kg)     SpO2:  96% 92% 96%     Constitutional: Alert and oriented. In no acute distress, boistrous and  loud.. Eyes: Conjunctivae are normal. PERRL. EOMI. Head: Atraumatic. Nose: No congestion/rhinnorhea. Mouth/Throat: Mucous membranes are dry.  Oropharynx non-erythematous. Neck: No stridor.   Cardiovascular: Normal rate, regular rhythm. Grossly normal heart sounds.  Good peripheral circulation. Respiratory: Normal respiratory effort.  No retractions. Lungs CTAB. Gastrointestinal: Soft and nontender. No distention. No CVA tenderness. Genitourinary: deferred Musculoskeletal: There is marked induration and erythema associated with the left lower leg up to the middle of the shin, the left foot is swollen and erythematous with no processes of the third and fourth toes, the patient has no movement of the toes of the left foot, no sensation to light touch.  There are oozing blisters on the medial aspect of the left foot. There is a chronic ulceration of the right heel. There are dopplered DP pulses bilaterally.  Neurologic:  Normal speech and language. No gross focal neurologic deficits are appreciated except as noted in the extremity exam. Skin:  Skin is warm, dry and intact. No rash noted. Psychiatric: Mood and affect are normal. Speech and behavior are normal.  ____________________________________________   LABS (all labs ordered are listed, but only abnormal results are displayed)  Labs Reviewed  CBC WITH DIFFERENTIAL/PLATELET - Abnormal; Notable for the following:    WBC 22.5 (*)    MCHC 31.5 (*)    Neutro Abs 20.7 (*)    Lymphs Abs 0.4 (*)    Monocytes Absolute 1.3 (*)    All other components within normal limits  COMPREHENSIVE METABOLIC PANEL - Abnormal; Notable for the following:    Sodium 128 (*)    Chloride 87 (*)    Glucose, Bld 833 (*)    Calcium 7.2 (*)    Total Protein 6.0 (*)    Albumin 2.2 (*)    AST 14 (*)    ALT 14 (*)    Alkaline Phosphatase 127 (*)    Total Bilirubin 2.1 (*)    All other components within normal limits  BLOOD GAS, VENOUS - Abnormal; Notable for the  following:    Bicarbonate 39.2 (*)    Acid-Base Excess 12.3 (*)    All other components within normal limits  CULTURE, BLOOD (ROUTINE X 2)  CULTURE, BLOOD (ROUTINE X 2)  ETHANOL   ____________________________________________  EKG  ED ECG REPORT I, Gayla DossGayle, Quatavious Rossa A, the attending physician, personally viewed and interpreted this ECG.   Date: 01/08/2015  EKG Time: 08:59  Rate: 81  Rhythm: normal sinus rhythm  Axis: normal  Intervals:none  ST&T Change: LVH. No STEMI.  ____________________________________________  RADIOLOGY  Xray left foot  IMPRESSION: Diffuse osteopenia. No focal finding.  ____________________________________________   PROCEDURES  Procedure(s) performed: None  Critical Care performed: Yes, see critical care note(s). Critical care time spent 30 minutes.  ____________________________________________   INITIAL IMPRESSION / ASSESSMENT AND PLAN / ED COURSE  Pertinent labs & imaging results that were available during my care of the patient were reviewed by me and considered in my medical decision making (see chart for details).  Johnny Lane is a 64 y.o. Male with diabetes, peripheral artery disease with chronic foot wounds of bilateral lower extremities followed by podiatry presents for evaluation of worsening left foot redness and swelling, chronic/ongoing for many days. Additionally, he is not having subjective fevers and chills and uncontrolled blood sugars. On exam he is nontoxic appearing and in no acute distress. Vital signs are stable, he is afebrile. Left foot with cellulitis as well as necrosis of the third and fourth toe however he does have dopplered DP pulse. Will give vancomycin and Zosyn, obtain basic labs, x-ray to evaluate for evidence of osteo. Anticipate admission.   ----------------------------------------- 10:25 AM on 01/08/2015 -----------------------------------------  labs reviewed. White blood cell count elevated at 22,000. CMP  notable for pseudohyponatremia likely secondary to severe hyperglycemia, glucose is elevated at 833. Potassium 5.0. We'll give IV insulin, continue liberal IV fluids. Plain films do not show evidence of osteo. Case discussed with Dr. Juliene Pina for admission at this time. ____________________________________________   FINAL CLINICAL IMPRESSION(S) / ED DIAGNOSES  Final diagnoses:  Cellulitis of left lower extremity  Hyperglycemia      Gayla Doss, MD 01/08/15 [phone]

## 2015-01-08 NOTE — Care Management Note (Signed)
Case Management Note  Patient Details  Name: Johnny Lane MRN: [id number] Date of Birth: 05/11/1950  Subjective/Objective:      Patient  [place] MD aware as well as RN for the Patient. We will obtain the waiver paperwork and fax it.              Action/Plan:   Expected Discharge Date:                  Expected Discharge Plan:     In-House Referral:     Discharge planning Services     Post Acute Care Choice:    Choice offered to:     DME Arranged:    DME Agency:     HH Arranged:    HH Agency:     Status of Service:     Medicare Important Message Given:    Date Medicare IM Given:    Medicare IM give by:    Date Additional Medicare IM Given:    Additional Medicare Important Message give by:     If discussed at Long Length of Stay Meetings, dates discussed:    Additional Comments:  Berna BueCheryl Solon Alban, RN 01/08/2015, 9:58 AM

## 2015-01-09 ENCOUNTER — Encounter
Admission: EM | Disposition: A | Discharge status: Skilled Nursing Facility | Payer: Self-pay | Source: Home / Self Care | Attending: Internal Medicine

## 2015-01-09 DIAGNOSIS — E1152 Type 2 diabetes mellitus with diabetic peripheral angiopathy with gangrene: Secondary | ICD-10-CM | POA: Diagnosis not present

## 2015-01-09 HISTORY — PX: PERIPHERAL VASCULAR CATHETERIZATION: SHX172C

## 2015-01-09 LAB — CBC
HEMATOCRIT: 36.9 % — AB (ref 40.0–52.0)
HEMOGLOBIN: 12.1 g/dL — AB (ref 13.0–18.0)
MCH: 30.5 pg (ref 26.0–34.0)
MCHC: 32.7 g/dL (ref 32.0–36.0)
MCV: 93.4 fL (ref 80.0–100.0)
Platelets: 265 10*3/uL (ref 150–440)
RBC: 3.96 MIL/uL — AB (ref 4.40–5.90)
RDW: 13.7 % (ref 11.5–14.5)
WBC: 21.9 10*3/uL — ABNORMAL HIGH (ref 3.8–10.6)

## 2015-01-09 LAB — BASIC METABOLIC PANEL
ANION GAP: 4 — AB (ref 5–15)
BUN: 20 mg/dL (ref 6–20)
CALCIUM: 6.5 mg/dL — AB (ref 8.9–10.3)
CO2: 32 mmol/L (ref 22–32)
Chloride: 93 mmol/L — ABNORMAL LOW (ref 101–111)
Creatinine, Ser: 1.62 mg/dL — ABNORMAL HIGH (ref 0.61–1.24)
GFR calc non Af Amer: 43 mL/min — ABNORMAL LOW (ref >60)
GFR, EST AFRICAN AMERICAN: 49 mL/min — AB (ref >60)
Glucose, Bld: 173 mg/dL — ABNORMAL HIGH (ref 65–99)
POTASSIUM: 3.2 mmol/L — AB (ref 3.5–5.1)
Sodium: 129 mmol/L — ABNORMAL LOW (ref 135–145)

## 2015-01-09 LAB — GLUCOSE, CAPILLARY
GLUCOSE-CAPILLARY: 105 mg/dL — AB (ref 65–99)
GLUCOSE-CAPILLARY: 188 mg/dL — AB (ref 65–99)
GLUCOSE-CAPILLARY: 217 mg/dL — AB (ref 65–99)
GLUCOSE-CAPILLARY: 223 mg/dL — AB (ref 65–99)
GLUCOSE-CAPILLARY: 303 mg/dL — AB (ref 65–99)
GLUCOSE-CAPILLARY: 90 mg/dL (ref 65–99)

## 2015-01-09 LAB — MAGNESIUM: Magnesium: 2.8 mg/dL — ABNORMAL HIGH (ref 1.7–2.4)

## 2015-01-09 SURGERY — LOWER EXTREMITY INTERVENTION
Wound class: Clean

## 2015-01-09 MED ORDER — FENTANYL CITRATE (PF) 100 MCG/2ML IJ SOLN
INTRAMUSCULAR | Status: DC | PRN
  Administered 2015-01-09: 50 ug via INTRAVENOUS

## 2015-01-09 MED ORDER — HEPARIN (PORCINE) IN NACL 2-0.9 UNIT/ML-% IJ SOLN
INTRAMUSCULAR | Status: AC
  Filled 2015-01-09: qty 1000

## 2015-01-09 MED ORDER — HEPARIN SODIUM (PORCINE) 1000 UNIT/ML IJ SOLN
INTRAMUSCULAR | Status: AC
  Filled 2015-01-09: qty 1

## 2015-01-09 MED ORDER — FENTANYL CITRATE (PF) 100 MCG/2ML IJ SOLN
INTRAMUSCULAR | Status: AC
  Filled 2015-01-09: qty 2

## 2015-01-09 MED ORDER — VANCOMYCIN HCL IN DEXTROSE 1-5 GM/200ML-% IV SOLN
1000.0000 mg | INTRAVENOUS | Status: DC
  Filled 2015-01-09 (×2): qty 200

## 2015-01-09 MED ORDER — POTASSIUM CHLORIDE CRYS ER 20 MEQ PO TBCR
40.0000 meq | EXTENDED_RELEASE_TABLET | Freq: Once | ORAL | Status: AC
  Administered 2015-01-09: 40 meq via ORAL
  Filled 2015-01-09: qty 2

## 2015-01-09 MED ORDER — IOHEXOL 300 MG/ML  SOLN
INTRAMUSCULAR | Status: DC | PRN
  Administered 2015-01-09: 65 mL via INTRA_ARTERIAL

## 2015-01-09 MED ORDER — LIDOCAINE-EPINEPHRINE (PF) 1 %-1:200000 IJ SOLN
INTRAMUSCULAR | Status: AC
  Filled 2015-01-09: qty 30

## 2015-01-09 MED ORDER — OXYCODONE-ACETAMINOPHEN 5-325 MG PO TABS
1.0000 | ORAL_TABLET | ORAL | Status: DC | PRN
  Administered 2015-01-10 – 2015-01-11 (×2): 1 via ORAL
  Administered 2015-01-11: 2 via ORAL
  Administered 2015-01-11: 1 via ORAL
  Administered 2015-01-12: 2 via ORAL
  Administered 2015-01-13: 1 via ORAL
  Filled 2015-01-09 (×2): qty 1
  Filled 2015-01-09: qty 2
  Filled 2015-01-09 (×2): qty 1
  Filled 2015-01-09 (×2): qty 2

## 2015-01-09 MED ORDER — CHLORHEXIDINE GLUCONATE CLOTH 2 % EX PADS
6.0000 | MEDICATED_PAD | Freq: Once | CUTANEOUS | Status: AC
  Administered 2015-01-09: 6 via TOPICAL

## 2015-01-09 MED ORDER — SODIUM BICARBONATE BOLUS VIA INFUSION
INTRAVENOUS | Status: AC
  Administered 2015-01-09: 15:00:00 via INTRAVENOUS
  Filled 2015-01-09: qty 1

## 2015-01-09 MED ORDER — MIDAZOLAM HCL 5 MG/5ML IJ SOLN
INTRAMUSCULAR | Status: AC
  Filled 2015-01-09: qty 5

## 2015-01-09 MED ORDER — MIDAZOLAM HCL 2 MG/2ML IJ SOLN
INTRAMUSCULAR | Status: DC | PRN
  Administered 2015-01-09: 1 mg via INTRAVENOUS

## 2015-01-09 MED ORDER — SODIUM CHLORIDE 0.9 % IV SOLN: INTRAVENOUS | Status: DC

## 2015-01-09 MED ORDER — INSULIN ASPART 100 UNIT/ML ~~LOC~~ SOLN
0.0000 [IU] | Freq: Three times a day (TID) | SUBCUTANEOUS | Status: DC
  Administered 2015-01-09: 5 [IU] via SUBCUTANEOUS
  Administered 2015-01-11 (×2): 2 [IU] via SUBCUTANEOUS
  Filled 2015-01-09: qty 5
  Filled 2015-01-09 (×2): qty 2

## 2015-01-09 MED ORDER — INSULIN ASPART 100 UNIT/ML ~~LOC~~ SOLN
0.0000 [IU] | Freq: Every day | SUBCUTANEOUS | Status: DC
  Administered 2015-01-09: 4 [IU] via SUBCUTANEOUS
  Filled 2015-01-09: qty 4
  Filled 2015-01-09: qty 2

## 2015-01-09 MED ORDER — INSULIN ASPART 100 UNIT/ML ~~LOC~~ SOLN: 0.0000 [IU] | Freq: Every day | SUBCUTANEOUS | Status: DC

## 2015-01-09 MED ORDER — SODIUM BICARBONATE 8.4 % IV SOLN
INTRAVENOUS | Status: AC
  Administered 2015-01-09: 15:00:00 via INTRAVENOUS
  Filled 2015-01-09: qty 500

## 2015-01-09 MED ORDER — INSULIN ASPART 100 UNIT/ML ~~LOC~~ SOLN
0.0000 [IU] | Freq: Three times a day (TID) | SUBCUTANEOUS | Status: DC
  Administered 2015-01-09: 4 [IU] via SUBCUTANEOUS
  Filled 2015-01-09: qty 4

## 2015-01-09 MED ORDER — MORPHINE SULFATE (PF) 4 MG/ML IV SOLN
4.0000 mg | INTRAVENOUS | Status: DC | PRN
  Administered 2015-01-09 – 2015-01-12 (×2): 4 mg via INTRAVENOUS
  Filled 2015-01-09 (×2): qty 1

## 2015-01-09 SURGICAL SUPPLY — 22 items
BALLN LUTONIX 5X150X130 (BALLOONS) ×10
BALLN LUTONIX DCB 4X80X130 (BALLOONS) ×5
BALLN LUTONIX DCB 6X80X130 (BALLOONS) ×5
BALLN ULTRVRSE 3X150X130 (BALLOONS) ×5
BALLOON LUTONIX 5X150X130 (BALLOONS) ×6 IMPLANT
BALLOON LUTONIX DCB 4X80X130 (BALLOONS) ×3 IMPLANT
BALLOON LUTONIX DCB 6X80X130 (BALLOONS) ×3 IMPLANT
BALLOON ULTRVRSE 3X150X130 (BALLOONS) ×3 IMPLANT
CATH PIG 70CM (CATHETERS) ×5 IMPLANT
CATH VERT 100CM (CATHETERS) ×5 IMPLANT
DEVICE PRESTO INFLATION (MISCELLANEOUS) ×5 IMPLANT
DEVICE STARCLOSE SE CLOSURE (Vascular Products) ×5 IMPLANT
GLIDEWIRE ADV .035X260CM (WIRE) ×5 IMPLANT
GUIDEWIRE SUPER STIFF .035X180 (WIRE) ×5 IMPLANT
LIFESTENT 6X150X130 (Permanent Stent) ×5 IMPLANT
PACK ANGIOGRAPHY (CUSTOM PROCEDURE TRAY) ×5 IMPLANT
SHEATH ANL2 6FRX45 HC (SHEATH) ×5 IMPLANT
SHEATH BRITE TIP 4FRX11 (SHEATH) ×5 IMPLANT
SHEATH BRITE TIP 5FRX11 (SHEATH) ×5 IMPLANT
SYR MEDRAD MARK V 150ML (SYRINGE) ×5 IMPLANT
TUBING CONTRAST HIGH PRESS 72 (TUBING) ×5 IMPLANT
WIRE J 3MM .035X145CM (WIRE) ×5 IMPLANT

## 2015-01-09 NOTE — Progress Notes (Signed)
[facility] [place] [facility]   PATIENT NAME: Johnny RastDavid Crocker    MR#:  [id number]  DATE OF BIRTH:  03/22/1950  SUBJECTIVE:  CHIEF COMPLAINT:   Chief Complaint  Patient presents with  . Insect Bite   left foot pain  REVIEW OF SYSTEMS:  CONSTITUTIONAL: No fever, fatigue or weakness.  EYES: No blurred or double vision.  EARS, NOSE, AND THROAT: No tinnitus or ear pain.  RESPIRATORY: No cough, shortness of breath, wheezing or hemoptysis.  CARDIOVASCULAR: No chest pain, orthopnea, edema.  GASTROINTESTINAL: No nausea, vomiting, diarrhea or abdominal pain.  GENITOURINARY: No dysuria, hematuria.  ENDOCRINE: No polyuria, nocturia,  HEMATOLOGY: No anemia, easy bruising or bleeding SKIN: No rash or lesion. MUSCULOSKELETAL:  left foot pain NEUROLOGIC: No tingling, numbness, weakness.  PSYCHIATRY: No anxiety or depression.   DRUG ALLERGIES:  No Known Allergies  VITALS:  Blood pressure 119/57, pulse 86, temperature 98.7 F (37.1 C), temperature source Oral, resp. rate 18, height 5\' 8"  (1.727 m), weight 81.647 kg (180 lb), SpO2 98 %.  PHYSICAL EXAMINATION:  GENERAL:  64 y.o.-year-old patient lying in the bed with no acute distress.  EYES: Pupils equal, round, reactive to light and accommodation. No scleral icterus. Extraocular muscles intact.  HEENT: Head atraumatic, normocephalic. Oropharynx and nasopharynx clear.  NECK:  Supple, no jugular venous distention. No thyroid enlargement, no tenderness.  LUNGS: Normal breath sounds bilaterally, no wheezing, rales,rhonchi or crepitation. No use of accessory muscles of respiration.  CARDIOVASCULAR: S1, S2 normal. No murmurs, rubs, or gallops.  ABDOMEN: Soft, nontender, nondistended. Bowel sounds present. No organomegaly or mass.  EXTREMITIES: No pedal edema, cyanosis, or clubbing.  left foot and leg tenderness and erythema under knee, foot in dressing. NEUROLOGIC: Cranial nerves II through XII are intact. Muscle  strength 4/5 in all extremities. Sensation intact. Gait not checked.  PSYCHIATRIC: The patient is alert and oriented x 3.  SKIN: No obvious rash, lesion, or ulcer.    LABORATORY PANEL:   CBC  Recent Labs Lab 01/09/15 0444  WBC 21.9*  HGB 12.1*  HCT 36.9*  PLT 265   ------------------------------------------------------------------------------------------------------------------  Chemistries   Recent Labs Lab 01/08/15 0846 01/09/15 0444  NA 128* 129*  K 5.0 3.2*  CL 87* 93*  CO2 30 32  GLUCOSE 833* 173*  BUN 19 20  CREATININE 0.91 1.62*  CALCIUM 7.2* 6.5*  MG  --  2.8*  AST 14*  --   ALT 14*  --   ALKPHOS 127*  --   BILITOT 2.1*  --    ------------------------------------------------------------------------------------------------------------------  Cardiac Enzymes No results for input(s): TROPONINI in the last 168 hours. ------------------------------------------------------------------------------------------------------------------  RADIOLOGY:  Dg Chest 1 View  01/08/2015  CLINICAL DATA:  Weakness for the last few weeks. Hypertension, diabetes and smoking history. EXAM: CHEST 1 VIEW COMPARISON:  None. FINDINGS: The heart is at the upper limits of normal in size. The left lung is clear. On the right, there is a pleural effusion with abnormal density in the right lower lung that could be atelectasis and/or pneumonia. No significant bone finding. IMPRESSION: Right effusion. Abnormal density in the right lower lobe that could be atelectasis and/or pneumonia. Electronically Signed   By: Paulina FusiMark  Shogry M.D.   On: 01/08/2015 11:23   Dg Foot Complete Left  01/08/2015  CLINICAL DATA:  Spider bite 10 days ago on the fourth toe. Regional inflammation and drainage. EXAM: LEFT FOOT - COMPLETE 3+ VIEW COMPARISON:  None. FINDINGS: The bones are diffusely osteopenic.  No evidence of fracture. No identifiable focal lesion. There is regional arterial calcification but no sign of  radiopaque foreign object. IMPRESSION: Diffuse osteopenia.  No focal finding. Electronically Signed   By: Paulina Fusi M.D.   On: 01/08/2015 09:25    EKG:   Orders placed or performed during the hospital encounter of 01/08/15  . EKG 12-Lead  . EKG 12-Lead    ASSESSMENT AND PLAN:   1. Cellulitis and Gangrene left foot with known PAD:  continue Zosyn and vancomycin, follow up Blood cultures. X-ray does not show evidence of osteomyelitis.  Pain control.  Follow up Dr. Ether Griffins for debridement and wound care consultation.  Follow-up with Dr. Wyn Quaker for angiograph today.  * Leukocytosis. Follow-up CBC.  2. Uncontrolled diabetes:  Off insulin drip for better control of blood sugars. On lantus and moderate-dose sliding scale insulin. discontinue glipizide and metformin.  3. Essential hypertension: controlled, Continue lisinopril. He reports he does not take metoprolol as prescribed to him due to side effects. Monitor blood pressure and if elevated then I would discuss with him the need to restart metoprolol and Imdur  4. CAD: disontinue aspirin and Plavix for possible surgery. Continue atorvastatin.  5. Ischemic cardiomyopathy EF 35%: Patient states he does not take Lasix due to the side effect of urinating frequently. Watch his fluid status. Continue lisinopril.  6. Tobacco dependence: Patient states he is trying to quit smoking. Quit back in 2015 he sometimes slips up. Patient is encouraged to continue to stop smoking. Patient counseled 3 minutes.  7. EtOH abuse: Patient reports he drinks 1-2 beers a day. He reports he has not had withdrawal. Monitor for signs of withdrawal. On CIWA protocol.   * ARF. Hold lasix. Continue NS iv, follow up BMP. * Hyponatremia. Hold lasix. Continue NS iv, follow up BMP. * Hypokalemia. K supplement and follow up BMP.   All the records are reviewed and case discussed with Care Management/Social Workerr. Management plans discussed with the patient, family  and they are in agreement.  CODE STATUS: DO NOT RESUSCITATE  TOTAL TIME TAKING CARE OF THIS PATIENT: 43 minutes.  Greater than 50% time was spent on coordination of care and face-to-face counseling.  POSSIBLE D/C IN 4-5 DAYS, DEPENDING ON CLINICAL CONDITION.   Shaune Pollack M.D on 01/09/2015 at 2:08 PM  Between 7am to 6pm - Pager - [phone]  After 6pm go to [email or web address] - password [email or web address] [facility]  [facility]  Office  [phone]  CC: Primary care physician; Leotis Shames, MD

## 2015-01-09 NOTE — Progress Notes (Signed)
Pt transported to vascular for procedure pt verbalizes understanding

## 2015-01-09 NOTE — Consult Note (Signed)
[facility] Vascular Consult Note  MRN : [id number]  Johnny Lane is a 64 y.o. (Mar 20, 1950) male who presents with chief complaint of  Chief Complaint  Patient presents with  . Insect Bite  .  History of Present Illness: We are asked to see the patient by Dr. Ether Griffins and podiatry as well as the primary internal medicine service for a vascular evaluation of the lower extremities. Patient presents with cyanosis and cellulitis of the left foot as well as ulceration. He also has a small ulcer of the right heel which has been very slow to heal. He reports having had an angiogram with intervention about 6 months ago at the [facility] in the room. This was for nonhealing ulcerations and pain of the left foot. He does have significant pain in this left foot. He sees Dr. Ether Griffins and podiatry who has followed him regularly. He saw him in the hospital and the patient has nonpalpable pedal pulses so we were asked to see the patient to assess his vascular status and try to improve his perfusion for limb salvage. The patient was adamant he will not have an amputation and he will die before he has an amputation. He has a long history of tobacco dependence and diabetes previously. He reports fever and elevated white blood cell count at admission.  Current Facility-Administered Medications  Medication Dose Route Frequency Provider Last Rate Last Dose  . 0.9 %  sodium chloride infusion   Intravenous Continuous Shaune Pollack, MD 100 mL/hr at 01/09/15 0913    . 0.9 %  sodium chloride infusion   Intravenous Continuous Annice Needy, MD      . acetaminophen (TYLENOL) tablet 650 mg  650 mg Oral Q6H PRN Adrian Saran, MD   650 mg at 01/09/15 0538   Or  . acetaminophen (TYLENOL) suppository 650 mg  650 mg Rectal Q6H PRN Adrian Saran, MD      . alum & mag hydroxide-simeth (MAALOX/MYLANTA) 200-200-20 MG/5ML suspension 30 mL  30 mL Oral Q6H PRN Adrian Saran, MD      . antiseptic oral rinse (CPC / CETYLPYRIDINIUM  CHLORIDE 0.05%) solution 7 mL  7 mL Mouth Rinse BID Adrian Saran, MD   7 mL at 01/09/15 0912  . aspirin EC tablet 81 mg  81 mg Oral Daily Adrian Saran, MD   81 mg at 01/09/15 0907  . atorvastatin (LIPITOR) tablet 80 mg  80 mg Oral QHS Adrian Saran, MD   80 mg at 01/08/15 2246  . bisacodyl (DULCOLAX) EC tablet 5 mg  5 mg Oral Daily PRN Adrian Saran, MD      . Chlorhexidine Gluconate Cloth 2 % PADS 6 each  6 each Topical Once Annice Needy, MD      . clopidogrel (PLAVIX) tablet 75 mg  75 mg Oral Daily Adrian Saran, MD   75 mg at 01/09/15 0907  . enoxaparin (LOVENOX) injection 40 mg  40 mg Subcutaneous Q24H Adrian Saran, MD   40 mg at 01/08/15 1935  . folic acid (FOLVITE) tablet 1 mg  1 mg Oral Daily Adrian Saran, MD   1 mg at 01/09/15 0907  . gabapentin (NEURONTIN) capsule 300 mg  300 mg Oral TID Adrian Saran, MD   300 mg at 01/09/15 0907  . glipiZIDE (GLUCOTROL) tablet 10 mg  10 mg Oral BID AC Adrian Saran, MD   10 mg at 01/09/15 0912  . HYDROcodone-acetaminophen (NORCO/VICODIN) 5-325 MG per tablet 1-2 tablet  1-2 tablet  Oral Q4H PRN Adrian SaranSital Mody, MD      . insulin aspart (novoLOG) injection 0-20 Units  0-20 Units Subcutaneous TID WC Shaune PollackQing Chen, MD   4 Units at 01/09/15 0908  . insulin aspart (novoLOG) injection 0-5 Units  0-5 Units Subcutaneous QHS Shaune PollackQing Chen, MD      . insulin glargine (LANTUS) injection 60 Units  60 Units Subcutaneous QHS Adrian SaranSital Mody, MD   60 Units at 01/08/15 2251  . lisinopril (PRINIVIL,ZESTRIL) tablet 10 mg  10 mg Oral Daily Adrian SaranSital Mody, MD   10 mg at 01/09/15 0907  . LORazepam (ATIVAN) tablet 1 mg  1 mg Oral Q6H PRN Adrian SaranSital Mody, MD       Or  . LORazepam (ATIVAN) injection 1 mg  1 mg Intravenous Q6H PRN Adrian SaranSital Mody, MD      . menthol-cetylpyridinium (CEPACOL) lozenge 3 mg  1 lozenge Oral PRN Oralia Manisavid Willis, MD      . metFORMIN (GLUCOPHAGE) tablet 1,000 mg  1,000 mg Oral Q breakfast Adrian SaranSital Mody, MD   1,000 mg at 01/09/15 0907  . morphine 2 MG/ML injection 1 mg  1 mg Intravenous Q4H PRN Adrian SaranSital Mody, MD       . multivitamin with minerals tablet 1 tablet  1 tablet Oral Daily Adrian SaranSital Mody, MD   1 tablet at 01/09/15 0907  . ondansetron (ZOFRAN) tablet 4 mg  4 mg Oral Q6H PRN Adrian SaranSital Mody, MD       Or  . ondansetron (ZOFRAN) injection 4 mg  4 mg Intravenous Q6H PRN Sital Mody, MD      . piperacillin-tazobactam (ZOSYN) IVPB 3.375 g  3.375 g Intravenous 3 times per day Bertram SavinMichael L [name], RPH   3.375 g at 01/09/15 0534  . senna-docusate (Senokot-S) tablet 1 tablet  1 tablet Oral Daily Adrian SaranSital Mody, MD   1 tablet at 01/09/15 0908  . senna-docusate (Senokot-S) tablet 1 tablet  1 tablet Oral QHS PRN Adrian SaranSital Mody, MD      . thiamine (VITAMIN B-1) tablet 100 mg  100 mg Oral Daily Sital Mody, MD   100 mg at 01/09/15 [id number]   Or  . thiamine (B-1) injection 100 mg  100 mg Intravenous Daily Sital Mody, MD      . vancomycin (VANCOCIN) IVPB 1000 mg/200 mL premix  1,000 mg Intravenous Q12H Bertram SavinMichael L [name], RPH   1,000 mg at 01/09/15 [id number]    Past Medical History  Diagnosis Date  . Diabetes mellitus with neuropathy (HCC)   . Benign essential HTN   . PVD (peripheral vascular disease) (HCC)     History reviewed. No pertinent past surgical history.  Social History Social History  Substance Use Topics  . Smoking status: Former Games developermoker  . Smokeless tobacco: None  . Alcohol Use: None  No IVDU  Family History No bleeding disorders, clotting disorders, autoimmune diseases, or aneurysms  No Known Allergies   REVIEW OF SYSTEMS (Negative unless checked)  Constitutional: [] Weight loss  [] Fever  [] Chills Cardiac: [] Chest pain   [] Chest pressure   [] Palpitations   [] Shortness of breath when laying flat   [] Shortness of breath at rest   [] Shortness of breath with exertion. Vascular:  [] Pain in legs with walking   [] Pain in legs at rest   [] Pain in legs when laying flat   [] Claudication   [] Pain in feet when walking  [x] Pain in feet at rest  [] Pain in feet when laying flat   [] History of DVT   [] Phlebitis   [] Swelling in  legs   [] Varicose veins   [x] Non-healing ulcers Pulmonary:   [] Uses home oxygen   [] Productive cough   [] Hemoptysis   [] Wheeze  [] COPD   [] Asthma Neurologic:  [] Dizziness  [] Blackouts   [] Seizures   [] History of stroke   [] History of TIA  [] Aphasia   [] Temporary blindness   [] Dysphagia   [] Weakness or numbness in arms   [x] Weakness or numbness in legs Musculoskeletal:  [] Arthritis   [] Joint swelling   [] Joint pain   [] Low back pain Hematologic:  [] Easy bruising  [] Easy bleeding   [] Hypercoagulable state   [] Anemic  [] Hepatitis Gastrointestinal:  [] Blood in stool   [] Vomiting blood  [] Gastroesophageal reflux/heartburn   [] Difficulty swallowing. Genitourinary:  [x] Chronic kidney disease   [] Difficult urination  [] Frequent urination  [] Burning with urination   [] Blood in urine Skin:  [] Rashes   [x] Ulcers   [x] Wounds Psychological:  [] History of anxiety   []  History of major depression.  Physical Examination  Filed Vitals:   01/09/15 0700 01/09/15 0800 01/09/15 0811 01/09/15 0900  BP: 101/47 116/61  107/57  Pulse: 76 78  80  Temp:   98.9 F (37.2 C)   TempSrc:   Oral   Resp: 18 23  21   Height:      Weight:      SpO2: 93% 96%  94%   Body mass index is 27.38 kg/(m^2). Gen:  WD/WN, NAD Head: [place]/AT, No temporalis wasting. Prominent temp pulse not noted. Ear/Nose/Throat: Hearing grossly intact, nares w/o erythema or drainage, oropharynx w/o Erythema/Exudate Eyes: PERRLA, EOMI.  Neck: Supple, no nuchal rigidity.  No JVD.  Pulmonary:  Good air movement, equal bilaterally Cardiac: RRR, normal S1, S2, no Murmurs, rubs or gallops. Vascular:  Vessel Right Left  Radial Palpable Palpable  Ulnar Palpable Palpable  Brachial Palpable Palpable  Carotid Palpable, without bruit Palpable, without bruit  Aorta Not palpable N/A  Femoral Palpable Palpable  Popliteal Not Palpable Not Palpable  PT Weakly Palpable Not Palpable  DP Not Palpable Not Palpable   Gastrointestinal: soft,  non-tender/non-distended. No guarding/reflex. No masses, surgical incisions, or scars. Musculoskeletal: M/S 5/5 throughout.  Ulceration and gangrenous changes as below. 1+ right lower extremity edema and 2+ left lower extremity edema. Neurologic: CN 2-12 intact. Pain and light touch intact in extremities.  Symmetrical.  Speech is fluent. Motor exam as listed above. Psychiatric: Judgment intact, Mood & affect appropriate for pt's clinical situation. Dermatologic: Small superficial right heel ulcer is present. Toes on the left foot are cyanotic. Ulceration present as well as cellulitis and swelling into the mid foot. Lymph : No Cervical, Axillary, or Inguinal lymphadenopathy.      CBC Lab Results  Component Value Date   WBC 21.9* 01/09/2015   HGB 12.1* 01/09/2015   HCT 36.9* 01/09/2015   MCV 93.4 01/09/2015   PLT 265 01/09/2015    BMET    Component Value Date/Time   NA 129* 01/09/2015 0444   NA 140 12/25/2013 1136   K 3.2* 01/09/2015 0444   K 3.9 12/25/2013 1136   CL 93* 01/09/2015 0444   CL 107 12/25/2013 1136   CO2 32 01/09/2015 0444   CO2 30 12/25/2013 1136   GLUCOSE 173* 01/09/2015 0444   GLUCOSE 269* 12/25/2013 1136   BUN 20 01/09/2015 0444   BUN 21* 12/25/2013 1136   CREATININE 1.62* 01/09/2015 0444   CREATININE 0.71 12/25/2013 1136   CALCIUM 6.5* 01/09/2015 0444   CALCIUM 8.4* 12/25/2013 1136   GFRNONAA 43* 01/09/2015 0444   GFRNONAA >  60 12/25/2013 1136   GFRAA 49* 01/09/2015 0444   GFRAA >60 12/25/2013 1136   Estimated Creatinine Clearance: 43.4 mL/min (by C-G formula based on Cr of 1.62).  COAG No results found for: INR, PROTIME  Radiology Dg Chest 1 View  01/08/2015  CLINICAL DATA:  Weakness for the last few weeks. Hypertension, diabetes and smoking history. EXAM: CHEST 1 VIEW COMPARISON:  None. FINDINGS: The heart is at the upper limits of normal in size. The left lung is clear. On the right, there is a pleural effusion with abnormal density in the right  lower lung that could be atelectasis and/or pneumonia. No significant bone finding. IMPRESSION: Right effusion. Abnormal density in the right lower lobe that could be atelectasis and/or pneumonia. Electronically Signed   By: Paulina Fusi M.D.   On: 01/08/2015 11:23   Dg Foot Complete Left  01/08/2015  CLINICAL DATA:  Spider bite 10 days ago on the fourth toe. Regional inflammation and drainage. EXAM: LEFT FOOT - COMPLETE 3+ VIEW COMPARISON:  None. FINDINGS: The bones are diffusely osteopenic. No evidence of fracture. No identifiable focal lesion. There is regional arterial calcification but no sign of radiopaque foreign object. IMPRESSION: Diffuse osteopenia.  No focal finding. Electronically Signed   By: Paulina Fusi M.D.   On: 01/08/2015 09:25      Assessment/Plan 1. Gangrene left foot with known PAD.  This is clearly a limb threatening problem. The patient has infection of the foot as well as tissue loss and will likely require surgical debridement and at least partial amputation of the foot. This is unlikely to heal without that her perfusion. His perfusion status is not currently known, but I suspect that it is markedly reduced with nonpalpable pedal pulses and the tissue loss. I would recommend proceeding with an angiogram as soon as possible. I have hoped to do this today, but he was not nothing by mouth this morning so we will try to get this done this afternoon. I have discussed the risks and benefits of the procedure. The patient voices his understanding and is agreeable to proceed. 2. Chronic kidney disease. Hydration to avoid nephrotoxic effects of dye. His renal function certainly will make [place] limit the amount of contrast were used, but the clearly limb threatening situation will necessitate angiogram as soon as possible. 3. Diabetes with neuropathy and peripheral vascular disease. Glucose control important in wound healing for limb salvage. This also represents a strong atherosclerotic risk  factor. 4. Hypertension. Stable.   DEW,JASON, MD  01/09/2015 9:44 AM

## 2015-01-09 NOTE — Progress Notes (Signed)
Pt returned from procedure pt awake alert oriented med orders not verified by pharmacy

## 2015-01-09 NOTE — Progress Notes (Signed)
Oriented to room 138 no needs at present

## 2015-01-09 NOTE — Progress Notes (Signed)
ANTIBIOTIC CONSULT NOTE - INITIAL  Pharmacy Consult for Vancomycin and Zosyn Indication: Celluitis  No Known Allergies  Patient Measurements: Height:  (172.7 cm) Weight: 180 lb (81.647 kg) IBW/kg (Calculated) : 68.4  Vital Signs: Temp: 98.9 F (37.2 C) (12/22 0811) Temp Source: Oral (12/22 0811) BP: 83/40 mmHg (12/22 1000) Pulse Rate: 75 (12/22 1000) Intake/Output from previous day: 12/21 0701 - 12/22 0700 In: 2122.5 [P.O.:236; I.V.:1336.5; IV Piggyback:550] Out: 850 [Urine:850] Intake/Output from this shift: Total I/O In: 336 [P.O.:236; I.V.:100] Out: -   Labs:  Recent Labs  01/08/15 0846 01/09/15 0444  WBC 22.5* 21.9*  HGB 13.5 12.1*  PLT 296 265  CREATININE 0.91 1.62*   Estimated Creatinine Clearance: 43.4 mL/min (by C-G formula based on Cr of 1.62). No results for input(s): VANCOTROUGH, VANCOPEAK, VANCORANDOM, GENTTROUGH, GENTPEAK, GENTRANDOM, TOBRATROUGH, TOBRAPEAK, TOBRARND, AMIKACINPEAK, AMIKACINTROU, AMIKACIN in the last 72 hours.   Microbiology: Recent Results (from the past 720 hour(s))  Blood culture (routine x 2)     Status: None (Preliminary result)   Collection Time: 01/08/15  8:46 AM  Result Value Ref Range Status   Specimen Description BLOOD LEFT ASSIST CONTROL  Final   Special Requests   Final    BOTTLES DRAWN AEROBIC AND ANAEROBIC  5CC AERO 2CC ANAERO   Culture NO GROWTH < 12 HOURS  Final   Report Status PENDING  Incomplete  Blood culture (routine x 2)     Status: None (Preliminary result)   Collection Time: 01/08/15  9:29 AM  Result Value Ref Range Status   Specimen Description BLOOD RIGHT ARM  Final   Special Requests BOTTLES DRAWN AEROBIC AND ANAEROBIC  1CC  Final   Culture NO GROWTH < 12 HOURS  Final   Report Status PENDING  Incomplete  MRSA PCR Screening     Status: None   Collection Time: 01/08/15 11:53 AM  Result Value Ref Range Status   MRSA by PCR NEGATIVE NEGATIVE Final    Comment:        The GeneXpert MRSA Assay  (FDA approved for NASAL specimens only), is one component of a comprehensive MRSA colonization surveillance program. It is not intended to diagnose MRSA infection nor to guide or monitor treatment for MRSA infections.   Wound culture     Status: None (Preliminary result)   Collection Time: 01/08/15  4:18 PM  Result Value Ref Range Status   Specimen Description WOUND  Final   Special Requests Immunocompromised  Final   Gram Stain   Final    FEW WBC SEEN MODERATE GRAM POSITIVE COCCI IN PAIRS IN CHAINS    Culture   Final    MODERATE GROWTH BETA HEMOLYTIC ORGANISM BEING IDENTIFIED There is no known Penicillin Resistant Beta Streptococcus in the [place] For patients that are Penicillin-allergic, Erythromycin is 85-94% susceptible, and Clindamycin is 80% susceptible.  Contact Microbiology within 7 days if sensitivity testing is  required.      Report Status PENDING  Incomplete    Medical History: Past Medical History  Diagnosis Date  . Diabetes mellitus with neuropathy (HCC)   . Benign essential HTN   . PVD (peripheral vascular disease) (HCC)     Medications:  Scheduled:  . antiseptic oral rinse  7 mL Mouth Rinse BID  . atorvastatin  80 mg Oral QHS  . folic acid  1 mg Oral Daily  . gabapentin  300 mg Oral TID  . insulin aspart  0-15 Units Subcutaneous TID WC  . insulin aspart  0-5 Units Subcutaneous QHS  . insulin glargine  60 Units Subcutaneous QHS  . multivitamin with minerals  1 tablet Oral Daily  . piperacillin-tazobactam (ZOSYN)  IV  3.375 g Intravenous 3 times per day  . senna-docusate  1 tablet Oral Daily  . thiamine  100 mg Oral Daily   Or  . thiamine  100 mg Intravenous Daily  . vancomycin  1,000 mg Intravenous Q12H   Infusions:  . sodium chloride 100 mL/hr at 01/09/15 0913  . sodium chloride     Assessment: 64 y/o M with LLE cellulitis without evidence of osteomyelitis ordered empiric abx.   Ke: 0.043 h-1  Vd: 57.1 L  Goal of Therapy:  Vancomycin trough  level 10-15 mcg/ml  Plan:  Will continue Zosyn 3.375 g EI q 8 hours.   SCr has increased so will change vancomycin dosing to 1000 mg iv q 24 hours and check a trough with the 4th dose of the new regimen.   Will continue to follow renal function and culture results.   Luisa Harthristy, Ibrahem Volkman D 01/09/2015,11:10 AM

## 2015-01-09 NOTE — Progress Notes (Signed)
Inpatient Diabetes Program Recommendations  AACE/ADA: New Consensus Statement on Inpatient Glycemic Control (2015)  Target Ranges:  Prepandial:   less than 140 mg/dL      Peak postprandial:   less than 180 mg/dL (1-2 hours)      Critically ill patients:  140 - 180 mg/dL   Review of Glycemic Control  Results for Johnny Lane, Johnny Lane (MRN [id number]) as of 01/09/2015 07:57  Ref. Range 01/08/2015 22:24 01/08/2015 23:14 01/09/2015 00:06 01/09/2015 01:54 01/09/2015 07:22  Glucose-Capillary Latest Ref Range: 65-99 mg/dL 782157 (H) 956148 (H) 213105 (H) 90 188 (H)    Diabetes history: Type 2 Outpatient Diabetes medications: Glipizide 10 mg qday, Lantus 60 units qhs, Humalog 22 units tid, Glucophage 1000mg  qday Current orders for Inpatient glycemic control: Glipizide 10 mg qday, Lantus 60 units qhs, Glucophage 1000mg  qday  Inpatient Diabetes Program Recommendations: Consider stopping Glipizide and start Novolog moderate correction scale (0-15 untis tid)- he has been taking Humalog 22 units tid with meals at home- may need to add meal time insulin too- will follow.  Susette RacerJulie Sylena Lotter, RN, BA, MHA, CDE Diabetes Coordinator Inpatient Diabetes Program  [phone] (Team Pager) [phone] [facility] Office) 01/09/2015 8:06 AM

## 2015-01-09 NOTE — Op Note (Signed)
[facility] Percutaneous Study/Intervention Procedural Note   Date of Surgery: 01/09/2015  Surgeon(s):Jerime Arif   Assistants:none  Pre-operative Diagnosis: PAD with ulceration BLE  Post-operative diagnosis: Same  Procedure(s) Performed: 1. Ultrasound guidance for vascular access right femoral artery 2. Catheter placement into left posterior tibial artery from right femoral approach 3. Aortogram and selective left lower extremity angiogram 4. Percutaneous transluminal angioplasty of left posterior tibial artery and tibioperoneal trunk with 3 mm diameter by 15 cm length angioplasty balloon 5. Percutaneous transluminal angioplasty of left SFA and popliteal artery with 4 mm diameter by 8 cm length Lutonix angioplasty balloon distally, and 3 inflations with two 5 mm diameter by 15 cm length Lutonix drug-coated angioplasty balloons throughout the entire SFA and into the above-knee popliteal artery  6.  Self-expanding stent placement to the distal SFA and proximal popliteal artery for greater than 50% residual stenosis and dissection after angioplasty using a 6 mm diameter by 15 cm length stent  7.  Percutaneous transluminal angioplasty of the right external iliac artery with 6 mm diameter by 8 cm length Lutonix drug-coated angioplasty balloon 8. StarClose closure device right femoral artery  EBL: 25 cc  Indications: Patient is a 64 year old male with extensive peripheral vascular disease status post procedures bilaterally with nonhealing ulcerations of both lower extremities worse on the left with gangrenous changes as well on the left. The patient has no palpable pedal pulses. The patient is brought in for angiography for further evaluation and potential treatment. Risks and benefits are discussed and informed consent is obtained  Procedure: The patient was identified and  appropriate procedural time out was performed. The patient was then placed supine on the table and prepped and draped in the usual sterile fashion. Ultrasound was used to evaluate the right common femoral artery. It was diseased but patent . A digital ultrasound image was acquired. A Seldinger needle was used to access the right common femoral artery under direct ultrasound guidance and a permanent image was performed. A 0.035 J wire would not advance so I had to exchange for a micropuncture needle and wire. With a micropuncture needle and wire I was able to advance without resistance and eventually upsized to a 5Fr sheath. Pigtail catheter was placed into the aorta and an AP aortogram was performed. This demonstrated normal renal artery on the left with what appeared to be an occluded right renal artery, and normal aorta and iliac segment on the left with patent stents in place and without significant stenosis. The right external iliac artery just above the access site and just below the previously placed stent had a moderate stenosis in the 50-60% range. I then crossed the aortic bifurcation and advanced to the left femoral head. Selective left lower extremity angiogram was then performed. This demonstrated an occlusion of the SFA about 5-6 cm beyond its origin with reconstitution distally in the above-knee popliteal artery. The popliteal artery had a greater than 50% stenosis in multiple segments. The tibioperoneal trunk had about a 70% stenosis in the proximal posterior tibial artery also had about a 60-70% stenosis. The posterior tibial artery was the largest vessel to the foot. The peroneal artery also contributed flow distally. The patient was systemically heparinized and a 6 [place] Ansell sheath was then placed over the [employer] wire. I then used a Kumpe catheter and the advantage wire to navigate through the SFA and popliteal disease and confirm intraluminal flow in the tibioperoneal trunk. I  then advanced through the tibioperoneal trunk  and posterior tibial artery stenoses with the advantage wire. At this point I elected to proceed with treatment. The posterior tibial artery proximally and tibioperoneal trunk were treated with a 3 mm diameter by 15 cm length angioplasty balloon inflated to 14 atm for 1 minute. Completion angiogram following interventions showed a mild residual stenosis of about 30%. The popliteal artery at and just below the knee was treated with a 4 mm diameter by 8 cm length Lutonix drug-coated angioplasty balloon inflated to 10 atm for 1 minute for high-grade stenosis with excellent angiographic completion result and no significant residual stenosis. The long SFA occlusion down to the above-knee popliteal artery was treated with 2 Lutonix drug-coated angioplasty balloons. 5 mm diameter by 15 cm length balloons were selected and inflated proximally and distally with the proximal balloon also used to treat the mid SFA. Each inflation was from 10-14 atm for 1 minute. The proximal and mid SFA looked good with mild stenosis of less than 30% residual and slight dissection proximally which was not flow limiting. The distal SFA and above-knee popliteal artery at Hunter's canal had a greater than 70% residual stenosis and dissection at the reentry point. I elected to treat this with a self-expanding stent. A 6 mm diameter by 15 cm length self-expanding stent was deployed in the distal SFA and above-knee popliteal artery and postdilated with a 5 mm balloon with excellent angiographic completion result and less than 20% residual stenosis. I then pulled the sheath back on the right to evaluate the distal right external iliac artery stenosis. I treated this lesion with a 6 mm diameter by 8 cm length Lutonix drug-coated angioplasty balloon. This was inflated to 10 atm for 1 minute. Completion angiogram showed about a 50% residual stenosis into the common femoral artery but not in the location we  could treat from this right sided access. There also appeared to be a proximal SFA occlusion on the right and he will likely require an intervention to the right leg with his ulceration as well. I elected to terminate the procedure. The sheath was removed and StarClose closure device was deployed in the left femoral artery with excellent hemostatic result. The patient was taken to the recovery room in stable condition having tolerated the procedure well.  Findings:  Aortogram: distal right EIA stenosis in the 50-60% range, left iliac patent, aorta patent, left renal artery large and patent, right renal artery appeared occluded Left Lower Extremity: long SFA occlusion, popliteal stenosis, stenosis in TP trunk and proximal PT which was the best runoff vessel. Peroneal also patent   Disposition: Patient was taken to the recovery room in stable condition having tolerated the procedure well.  Complications: None  Teniya Filter 01/09/2015 4:54 PM

## 2015-01-10 LAB — CBC
HEMATOCRIT: 36.8 % — AB (ref 40.0–52.0)
Hemoglobin: 12.2 g/dL — ABNORMAL LOW (ref 13.0–18.0)
MCH: 30.6 pg (ref 26.0–34.0)
MCHC: 33.1 g/dL (ref 32.0–36.0)
MCV: 92.4 fL (ref 80.0–100.0)
Platelets: 255 10*3/uL (ref 150–440)
RBC: 3.99 MIL/uL — ABNORMAL LOW (ref 4.40–5.90)
RDW: 13.4 % (ref 11.5–14.5)
WBC: 23.6 10*3/uL — AB (ref 3.8–10.6)

## 2015-01-10 LAB — BASIC METABOLIC PANEL
Anion gap: 5 (ref 5–15)
BUN: 30 mg/dL — AB (ref 6–20)
CO2: 31 mmol/L (ref 22–32)
Calcium: 6.5 mg/dL — ABNORMAL LOW (ref 8.9–10.3)
Chloride: 93 mmol/L — ABNORMAL LOW (ref 101–111)
Creatinine, Ser: 2.37 mg/dL — ABNORMAL HIGH (ref 0.61–1.24)
GFR calc Af Amer: 31 mL/min — ABNORMAL LOW (ref >60)
GFR calc non Af Amer: 27 mL/min — ABNORMAL LOW (ref >60)
GLUCOSE: 238 mg/dL — AB (ref 65–99)
Potassium: 4 mmol/L (ref 3.5–5.1)
Sodium: 129 mmol/L — ABNORMAL LOW (ref 135–145)

## 2015-01-10 LAB — GLUCOSE, CAPILLARY
GLUCOSE-CAPILLARY: 191 mg/dL — AB (ref 65–99)
Glucose-Capillary: 111 mg/dL — ABNORMAL HIGH (ref 65–99)
Glucose-Capillary: 92 mg/dL (ref 65–99)
Glucose-Capillary: 86 mg/dL (ref 65–99)

## 2015-01-10 LAB — MAGNESIUM: Magnesium: 2.8 mg/dL — ABNORMAL HIGH (ref 1.7–2.4)

## 2015-01-10 LAB — VANCOMYCIN, RANDOM: VANCOMYCIN RM: 17 ug/mL

## 2015-01-10 LAB — VANCOMYCIN, TROUGH: VANCOMYCIN TR: 20 ug/mL (ref 10–20)

## 2015-01-10 MED ORDER — VANCOMYCIN HCL IN DEXTROSE 1-5 GM/200ML-% IV SOLN
1000.0000 mg | INTRAVENOUS | Status: DC
  Filled 2015-01-10: qty 200

## 2015-01-10 MED ORDER — VANCOMYCIN HCL IN DEXTROSE 1-5 GM/200ML-% IV SOLN
1000.0000 mg | INTRAVENOUS | Status: DC
  Administered 2015-01-10: 1000 mg via INTRAVENOUS
  Filled 2015-01-10: qty 200

## 2015-01-10 MED ORDER — COLLAGENASE 250 UNIT/GM EX OINT
TOPICAL_OINTMENT | Freq: Every day | CUTANEOUS | Status: DC
  Administered 2015-01-11 – 2015-01-14 (×4): via TOPICAL
  Filled 2015-01-10: qty 30

## 2015-01-10 MED ORDER — CLOPIDOGREL BISULFATE 75 MG PO TABS
75.0000 mg | ORAL_TABLET | Freq: Every day | ORAL | Status: DC
  Administered 2015-01-10 – 2015-01-14 (×5): 75 mg via ORAL
  Filled 2015-01-10 (×5): qty 1

## 2015-01-10 MED ORDER — ASPIRIN 81 MG PO CHEW
81.0000 mg | CHEWABLE_TABLET | Freq: Every day | ORAL | Status: DC
  Administered 2015-01-10 – 2015-01-14 (×5): 81 mg via ORAL
  Filled 2015-01-10 (×5): qty 1

## 2015-01-10 NOTE — Progress Notes (Signed)
CSW presented SNF bed offers to patient.  He has selected [facility].  CSW will continue to follow patient and assist with ongoing and discharge needs when patient is medically stable .  Sammuel Hineseborah Moore. Theresia MajorsLCSWA, MSW Clinical Social Work Department [phone] 5:47 PM

## 2015-01-10 NOTE — Progress Notes (Signed)
Inpatient Diabetes Program Recommendations  AACE/ADA: New Consensus Statement on Inpatient Glycemic Control (2015)  Target Ranges:  Prepandial:   less than 140 mg/dL      Peak postprandial:   less than 180 mg/dL (1-2 hours)      Critically ill patients:  140 - 180 mg/dL   Review of Glycemic Control  Results for Johnny Lane, Johnny Lane (MRN [id number]) as of 01/10/2015 08:58  Ref. Range 01/09/2015 07:22 01/09/2015 11:24 01/09/2015 18:18 01/09/2015 20:59 01/10/2015 08:07  Glucose-Capillary Latest Ref Range: 65-99 mg/dL 409188 (H) 811223 (H) 914217 (H) 303 (H) 111 (H)    Diabetes history: Type 2 Outpatient Diabetes medications: Glipizide 10 mg qday, Lantus 60 units qhs, Humalog 22 units tid, Glucophage 1000mg  qday Current orders for Inpatient glycemic control: Lantus 60 units qday, Novolog 0-15 units tid, Novolog 0-5 units qhs  Inpatient Diabetes Program Recommendations: Fasting blood sugar ideal.  No recommendations at this time. Missed some doses of Novolog insulin yesterday because of procedure- anticipate improvement today.  Watch post prandial blood sugars- takes Novolog 22 units tid with meals at home - as intake  Improves,  may need mealtime insulin in addition to the correction currently ordered.   Susette RacerJulie Arihant Pennings, RN, BA, MHA, CDE Diabetes Coordinator Inpatient Diabetes Program  [phone] (Team Pager) [phone] [facility] Office) 01/10/2015 9:04 AM

## 2015-01-10 NOTE — NC FL2 (Signed)
[place] MEDICAID FL2 LEVEL OF CARE SCREENING TOOL     IDENTIFICATION  Patient Name: Johnny Lane Birthdate: 01-Mar-1950 Sex: male Admission Date (Current Location): 01/08/2015  [place] and [place] Number:  Chiropodist and Address:  [facility], [address] [place], [place] [address]      Provider Number: [id number]  Attending Physician Name and Address:  Shaune Pollack, MD  Relative Name and Phone Number:       Current Level of Care: Hospital Recommended Level of Care: Skilled Nursing Facility Prior Approval Number:    Date Approved/Denied:   PASRR Number: [id number]  Discharge Plan: SNF    Current Diagnoses: Patient Active Problem List   Diagnosis Date Noted  . Cellulitis 01/08/2015    Orientation RESPIRATION BLADDER Height & Weight    Self, Time, Situation, Place  O2 (2 liters) Incontinent   138 lbs.  BEHAVIORAL SYMPTOMS/MOOD NEUROLOGICAL BOWEL NUTRITION STATUS   (no issues)  (none) Incontinent Diet  AMBULATORY STATUS COMMUNICATION OF NEEDS Skin   Limited Assist Verbally PU Stage and Appropriate Care                       Personal Care Assistance Level of Assistance              Functional Limitations Info             SPECIAL CARE FACTORS FREQUENCY  PT (By licensed PT)                    Contractures Contractures Info: Not present    Additional Factors Info  Code Status, Allergies Code Status Info: DNR Allergies Info: NKA           Current Medications (01/10/2015):  This is the current hospital active medication list Current Facility-Administered Medications  Medication Dose Route Frequency Provider Last Rate Last Dose  . 0.9 %  sodium chloride infusion   Intravenous Continuous Shaune Pollack, MD 100 mL/hr at 01/10/15 1456    . acetaminophen (TYLENOL) tablet 650 mg  650 mg Oral Q6H PRN Adrian Saran, MD   650 mg at 01/09/15 0538   Or  . acetaminophen (TYLENOL) suppository 650 mg  650 mg Rectal  Q6H PRN Adrian Saran, MD      . alum & mag hydroxide-simeth (MAALOX/MYLANTA) 200-200-20 MG/5ML suspension 30 mL  30 mL Oral Q6H PRN Adrian Saran, MD      . antiseptic oral rinse (CPC / CETYLPYRIDINIUM CHLORIDE 0.05%) solution 7 mL  7 mL Mouth Rinse BID Sital Mody, MD   7 mL at 01/10/15 1000  . aspirin chewable tablet 81 mg  81 mg Oral Daily Shaune Pollack, MD   81 mg at 01/10/15 1457  . atorvastatin (LIPITOR) tablet 80 mg  80 mg Oral QHS Adrian Saran, MD   80 mg at 01/09/15 2154  . bisacodyl (DULCOLAX) EC tablet 5 mg  5 mg Oral Daily PRN Adrian Saran, MD      . clopidogrel (PLAVIX) tablet 75 mg  75 mg Oral Daily Shaune Pollack, MD   75 mg at 01/10/15 1457  . collagenase (SANTYL) ointment   Topical Daily Shaune Pollack, MD      . folic acid (FOLVITE) tablet 1 mg  1 mg Oral Daily Adrian Saran, MD   1 mg at 01/10/15 0826  . gabapentin (NEURONTIN) capsule 300 mg  300 mg Oral TID Adrian Saran, MD   300 mg at 01/10/15 1457  .  insulin aspart (novoLOG) injection 0-15 Units  0-15 Units Subcutaneous TID WC Shaune PollackQing Tyran Huser, MD   5 Units at 01/09/15 1140  . insulin aspart (novoLOG) injection 0-5 Units  0-5 Units Subcutaneous QHS Shaune PollackQing Addeline Calarco, MD   4 Units at 01/09/15 2155  . insulin glargine (LANTUS) injection 60 Units  60 Units Subcutaneous QHS Adrian SaranSital Mody, MD   60 Units at 01/09/15 2155  . LORazepam (ATIVAN) tablet 1 mg  1 mg Oral Q6H PRN Adrian SaranSital Mody, MD       Or  . LORazepam (ATIVAN) injection 1 mg  1 mg Intravenous Q6H PRN Adrian SaranSital Mody, MD      . menthol-cetylpyridinium (CEPACOL) lozenge 3 mg  1 lozenge Oral PRN Oralia Manisavid Willis, MD      . morphine 4 MG/ML injection 4 mg  4 mg Intravenous Q4H PRN Shaune PollackQing Carrina Schoenberger, MD   4 mg at 01/09/15 1140  . multivitamin with minerals tablet 1 tablet  1 tablet Oral Daily Adrian SaranSital Mody, MD   1 tablet at 01/10/15 0826  . ondansetron (ZOFRAN) tablet 4 mg  4 mg Oral Q6H PRN Adrian SaranSital Mody, MD       Or  . ondansetron (ZOFRAN) injection 4 mg  4 mg Intravenous Q6H PRN Adrian SaranSital Mody, MD      . oxyCODONE-acetaminophen  (PERCOCET/ROXICET) 5-325 MG per tablet 1-2 tablet  1-2 tablet Oral Q4H PRN Shaune PollackQing Heydy Montilla, MD   1 tablet at 01/10/15 1353  . piperacillin-tazobactam (ZOSYN) IVPB 3.375 g  3.375 g Intravenous 3 times per day Bertram SavinMichael L [name], RPH   3.375 g at 01/10/15 1248  . senna-docusate (Senokot-S) tablet 1 tablet  1 tablet Oral Daily Adrian SaranSital Mody, MD   1 tablet at 01/10/15 0826  . senna-docusate (Senokot-S) tablet 1 tablet  1 tablet Oral QHS PRN Adrian SaranSital Mody, MD      . thiamine (VITAMIN B-1) tablet 100 mg  100 mg Oral Daily Adrian SaranSital Mody, MD   100 mg at 01/10/15 0825  . vancomycin (VANCOCIN) IVPB 1000 mg/200 mL premix  1,000 mg Intravenous Q24H Shaune PollackQing Aasir Daigler, MD         Discharge Medications: Please see discharge summary for a list of discharge medications.  Relevant Imaging Results:  Relevant Lab Results:   Additional Information SS: [id number]  York SpanielMonica Marra, LCSW

## 2015-01-10 NOTE — Progress Notes (Signed)
[facility] [place] [facility]   PATIENT NAME: Johnny Lane    MR#:  [id number]  DATE OF BIRTH:  [birth year]  SUBJECTIVE:  CHIEF COMPLAINT:   Chief Complaint  Patient presents with  . Insect Bite   left foot pain  REVIEW OF SYSTEMS:  CONSTITUTIONAL: No fever, fatigue or weakness.  EYES: No blurred or double vision.  EARS, NOSE, AND THROAT: No tinnitus or ear pain.  RESPIRATORY: No cough, shortness of breath, wheezing or hemoptysis.  CARDIOVASCULAR: No chest pain, orthopnea, edema.  GASTROINTESTINAL: No nausea, vomiting, diarrhea or abdominal pain.  GENITOURINARY: No dysuria, hematuria.  ENDOCRINE: No polyuria, nocturia,  HEMATOLOGY: No anemia, easy bruising or bleeding SKIN: No rash or lesion. MUSCULOSKELETAL:  left foot pain NEUROLOGIC: No tingling, numbness, weakness.  PSYCHIATRY: No anxiety or depression.   DRUG ALLERGIES:  No Known Allergies  VITALS:  Blood pressure 92/47, pulse 86, temperature 98.4 F (36.9 C), temperature source Oral, resp. rate 18, height  (1.727 m), weight 81.647 kg (180 lb), SpO2 93 %.  PHYSICAL EXAMINATION:  GENERAL:  64 y.o.-year-old patient lying in the bed with no acute distress.  EYES: Pupils equal, round, reactive to light and accommodation. No scleral icterus. Extraocular muscles intact.  HEENT: Head atraumatic, normocephalic. Oropharynx and nasopharynx clear.  NECK:  Supple, no jugular venous distention. No thyroid enlargement, no tenderness.  LUNGS: Normal breath sounds bilaterally, no wheezing, rales,rhonchi or crepitation. No use of accessory muscles of respiration.  CARDIOVASCULAR: S1, S2 normal. No murmurs, rubs, or gallops.  ABDOMEN: Soft, nontender, nondistended. Bowel sounds present. No organomegaly or mass.  EXTREMITIES: No pedal edema, cyanosis, or clubbing.  left foot and leg tenderness and erythema under knee, foot in dressing. NEUROLOGIC: Cranial nerves II through XII are intact. Muscle  strength 4/5 in all extremities. Sensation intact. Gait not checked.  PSYCHIATRIC: The patient is alert and oriented x 3.  SKIN: No obvious rash, lesion, or ulcer.    LABORATORY PANEL:   CBC  Recent Labs Lab 01/10/15 0203  WBC 23.6*  HGB 12.2*  HCT 36.8*  PLT 255   ------------------------------------------------------------------------------------------------------------------  Chemistries   Recent Labs Lab 01/08/15 0846  01/10/15 0203 01/10/15 1121  NA 128*  < > 129*  --   K 5.0  < > 4.0  --   CL 87*  < > 93*  --   CO2 30  < > 31  --   GLUCOSE 833*  < > 238*  --   BUN 19  < > 30*  --   CREATININE 0.91  < > 2.37*  --   CALCIUM 7.2*  < > 6.5*  --   MG  --   < >  --  2.8*  AST 14*  --   --   --   ALT 14*  --   --   --   ALKPHOS 127*  --   --   --   BILITOT 2.1*  --   --   --   < > = values in this interval not displayed. ------------------------------------------------------------------------------------------------------------------  Cardiac Enzymes No results for input(s): TROPONINI in the last 168 hours. ------------------------------------------------------------------------------------------------------------------  RADIOLOGY:  No results found.  EKG:   Orders placed or performed during the hospital encounter of 01/08/15  . EKG 12-Lead  . EKG 12-Lead    ASSESSMENT AND PLAN:   1. Cellulitis and Gangrene left foot with known PAD:  continue Zosyn and vancomycin, follow up Blood cultures. X-ray does  not show evidence of osteomyelitis.  Pain control.  Per Dr. Ether GriffinsFowler, would not recommend surgical debridment of wounds and prefer to begin with enzymatic debridment and local wound care.  POD # 1 s/p LE angiogram with revascularization.  Per Dr. Wyn Quakerew,  Blood flow optimized on the left leg. Still with disease on the right, and if ulcer does not heal in near future would recommend RLE angiogram as well. Follow up in office in 3 weeks or so with ABIs   *  Leukocytosis. Worsening, Follow-up CBC.  2. DM2: Controlled. Off insulin drip. On lantus 60 units HS and moderate-dose sliding scale insulin. discontinued glipizide and metformin.  3. Essential hypertension: controlled, discontinue lisinopril due to low blood pressure and acute renal failure. He reports he does not take metoprolol as prescribed to him due to side effects.  4. CAD: resume aspirin and Plavix. Continue atorvastatin.  5. Ischemic cardiomyopathy EF 35%: Patient states he does not take Lasix due to the side effect of urinating frequently. Watch his fluid status. discontinue lisinopril.  6. Tobacco dependence: Patient states he is trying to quit smoking. Quit back in 2015 he sometimes slips up. Patient is encouraged to continue to stop smoking. Patient counseled 3 minutes.  7. EtOH abuse: Patient reports he drinks 1-2 beers a day. He reports he has not had withdrawal. Monitor for signs of withdrawal. On CIWA protocol.   * ARF.  possible due to contrast. worsening, hold lisinopril and lasix. Continue NS iv, follow up BMP. * Hyponatremia. Hold lasix. Continue NS iv, follow up BMP. * Hypokalemia.  improved withK supplement..   PT evaluation.  All the records are reviewed and case discussed with Care Management/Social Workerr. Management plans discussed with the patient, his wife  and they are in agreement.  CODE STATUS: DO NOT RESUSCITATE  TOTAL TIME TAKING CARE OF THIS PATIENT: 43 minutes.  Greater than 50% time was spent on coordination of care and face-to-face counseling.  POSSIBLE D/C IN 3 DAYS, DEPENDING ON CLINICAL CONDITION.   Shaune Pollackhen, Iyesha Such M.D on 01/10/2015 at 2:37 PM  Between 7am to 6pm - Pager - [phone]  After 6pm go to [email or web address] - password [email or web address] [facility]  [facility]  Office  [phone]  CC: Primary care physician; Leotis ShamesSingh,[name], MD

## 2015-01-10 NOTE — Care Management Important Message (Signed)
Important Message  Patient Details  Name: Johnny Lane MRN: [id number] Date of Birth: 12/12/1950   Medicare Important Message Given:  Yes    Olegario MessierKathy A Sadler Teschner 01/10/2015, 9:51 AM

## 2015-01-10 NOTE — Care Management Note (Signed)
Case Management Note  Patient Details  Name: Johnny Lane MRN: [id number] Date of Birth: 07-02-1950  Subjective/Objective:    Met with patient to discuss discharge planning. He states prior to admission he was living alone. He has no transportation. He was followed by [facility] which according to patient was not working out well. He was walking with a walker but most recently has not been able to due to his wounds. Patient states he is not able to go back home. He prefers SNF with n ofacility preference. CSW updated on PT request and need for SNF.                Action/Plan:   Expected Discharge Date:                  Expected Discharge Plan:  Skilled Nursing Facility  In-House Referral:  Clinical Social Work  Discharge planning Services  CM Consult  Post Acute Care Choice:    Choice offered to:  Patient  DME Arranged:    DME Agency:     HH Arranged:    [place] Agency:     Status of Service:  In process, will continue to follow  Medicare Important Message Given:  Yes Date Medicare IM Given:    Medicare IM give by:    Date Additional Medicare IM Given:    Additional Medicare Important Message give by:     If discussed at [place] of Stay Meetings, dates discussed:    Additional Comments:  Jolly Mango, RN 01/10/2015, 10:18 AM

## 2015-01-10 NOTE — Evaluation (Signed)
Physical Therapy Evaluation Patient Details Name: Johnny Lane MRN: [id number] DOB: March 10, 1950 Today's Date: 01/10/2015   History of Present Illness  presented to ER secondary to non-healing wounds to L LE; admitted with cellulitis, gangrenous L LE.  Status post angiogram/angioplasty for revascularization 12/22.  Clinical Impression  Upon evaluation, patient alert and oriented to basic information; follows all commands.  Very limited by pain in L foot, rated at 4/10 throughout session.  Tolerates very minimal ROM or WBing to R LE.  Requires min/mod assist for bed mobility; mod assist +1 with RW for sit/stand.  Unable to maintain >5 seconds with each attempt due to pain; very limited active use of L LE with movement transitions.  Unsafe/unable to attempt stepping or OOB transfers at this time (patient refused transfer to chair).  Unsafe/unable for return home alone at this time. Would benefit from skilled PT to address above deficits and promote optimal return to PLOF; recommend transition to STR upon discharge from acute hospitalization.     Follow Up Recommendations SNF    Equipment Recommendations       Recommendations for Other Services       Precautions / Restrictions Precautions Precautions: Fall Restrictions Weight Bearing Restrictions: No      Mobility  Bed Mobility Overal bed mobility: Needs Assistance Bed Mobility: Supine to Sit     Supine to sit: Min assist;Mod assist        Transfers Overall transfer level: Needs assistance Equipment used: Rolling walker (2 wheeled) Transfers: Sit to/from Stand Sit to Stand: Mod assist         General transfer comment: tolerating stance x5-6 seconds at a time prior to return to seated position due to pain.  very poor standing tolerance, standing balance and L LE Wbing abilities (due to pain)  Ambulation/Gait             General Gait Details: unsafe/unable to tolerate  Stairs            Wheelchair Mobility     Modified Rankin (Stroke Patients Only)       Balance Overall balance assessment: Needs assistance Sitting-balance support: No upper extremity supported;Feet supported Sitting balance-Leahy Scale: Good     Standing balance support: Bilateral upper extremity supported Standing balance-Leahy Scale: Poor                               Pertinent Vitals/Pain Pain Assessment: 0-10 Pain Score: 4  Pain Location: L foot Pain Descriptors / Indicators: Aching Pain Intervention(s): Limited activity within patient's tolerance;Monitored during session;Repositioned;Patient requesting pain meds-RN notified;RN gave pain meds during session    Home Living Family/patient expects to be discharged to:: Private residence Living Arrangements: Alone   Type of Home: House Home Access: Stairs to enter Entrance Stairs-Rails: None Entrance Stairs-Number of Steps: 1 Home Layout: One level Home Equipment: Environmental consultant - 2 wheels      Prior Function Level of Independence: Needs assistance         Comments: Has recently been using RW for mobility attempts, but very limited in ability due to pain and worsening  ulceration to L foot/toes.  Does endorse several "near falls"     Hand Dominance        Extremity/Trunk Assessment   Upper Extremity Assessment: Overall WFL for tasks assessed (bilat shoulder elevation limited to shoulder-height only due to arthritic changes)           Lower Extremity Assessment:  Generalized weakness (globally weak and deconditioned, at least 3-/5 except very minimal active DF bilat ankles.  Baseline neuropathy with numbness all toes, decreased sensory awareness up to thighs.  Intermittent myoclonic jerking noted throughout all extremities at times)         Communication   Communication: No difficulties  Cognition Arousal/Alertness: Awake/alert Behavior During Therapy: WFL for tasks assessed/performed Overall Cognitive Status: Within Functional Limits  for tasks assessed                      General Comments General comments (skin integrity, edema, etc.): L LE with gauze wrapping around foot, areas of necrosis visible to L 4-5 toes; mild sanguinous drainage noted from L lateral heel    Exercises Other Exercises Other Exercises: Additional sit/stand with RW x2, mod assist +2--very unsteady/unsafe; unable to maintain >5 seconds, unable to integrate additional activity into standing at this time due ot constant need for bilat UE support. Other Exercises: Refused attempts at OOB transfers.      Assessment/Plan    PT Assessment Patient needs continued PT services  PT Diagnosis Difficulty walking;Generalized weakness;Acute pain   PT Problem List Decreased strength;Decreased activity tolerance;Decreased range of motion;Decreased balance;Decreased mobility;Decreased coordination;Decreased knowledge of use of DME;Decreased safety awareness;Decreased knowledge of precautions;Impaired sensation;Pain;Decreased skin integrity  PT Treatment Interventions DME instruction;Gait training;Stair training;Functional mobility training;Therapeutic activities;Therapeutic exercise;Balance training;Patient/family education   PT Goals (Current goals can be found in the Care Plan section) Acute Rehab PT Goals Patient Stated Goal: "to go to a facility" PT Goal Formulation: With patient Time For Goal Achievement: 01/24/15 Potential to Achieve Goals: Fair    Frequency Min 2X/week   Barriers to discharge Decreased caregiver support;Inaccessible home environment      Co-evaluation               End of Session Equipment Utilized During Treatment: Gait belt Activity Tolerance: Patient limited by pain Patient left: in bed;with call bell/phone within reach;with bed alarm set (seated edge of bed to eat lunch; RN informed/aware and okay with patient position) Nurse Communication: Mobility status (need for pain medication)         Time:  [id number] PT Time Calculation (min) (ACUTE ONLY): 21 min   Charges:   PT Evaluation $Initial PT Evaluation Tier I: 1 Procedure PT Treatments $Therapeutic Activity: 8-22 mins   PT G Codes:       Lindzee Gouge H. Manson PasseyBrown, PT, DPT, NCS 01/10/2015, 3:48 PM [phone]

## 2015-01-10 NOTE — Care Management Note (Signed)
Case Management Note  Patient Details  Name: Johnny Lane MRN: [id number] Date of Birth: 07/08/1950  Subjective/Objective:   Requested PT eval from Dr. Imogene Burnhen                 Action/Plan:   Expected Discharge Date:                  Expected Discharge Plan:     In-House Referral:     Discharge planning Services     Post Acute Care Choice:    Choice offered to:     DME Arranged:    DME Agency:     HH Arranged:    HH Agency:     Status of Service:     Medicare Important Message Given:  Yes Date Medicare IM Given:    Medicare IM give by:    Date Additional Medicare IM Given:    Additional Medicare Important Message give by:     If discussed at Long Length of Stay Meetings, dates discussed:    Additional Comments:  Marily MemosLisa M Tanazia Achee, RN 01/10/2015, 10:10 AM

## 2015-01-10 NOTE — Progress Notes (Signed)
ANTIBIOTIC CONSULT NOTE - Follow-Up  Pharmacy Consult for Vancomycin and Zosyn Indication: Celluitis  No Known Allergies  Patient Measurements: Height: 5\' 8"  (172.7 cm) Weight: 180 lb (81.647 kg) IBW/kg (Calculated) : 68.4  Vital Signs: Temp: 98.4 F (36.9 C) (12/23 1521) Temp Source: Oral (12/23 1521) BP: 116/52 mmHg (12/23 1521) Pulse Rate: 86 (12/23 1521) Intake/Output from previous day: 12/22 0701 - 12/23 0700 In: 2532.7 [P.O.:716; I.V.:1766.7; IV Piggyback:50] Out: 0  Intake/Output from this shift: Total I/O In: 240 [P.O.:240] Out: 2 [Urine:1; Stool:1]  Labs:  Recent Labs  01/08/15 0846 01/09/15 0444 01/10/15 0203  WBC 22.5* 21.9* 23.6*  HGB 13.5 12.1* 12.2*  PLT 296 265 255  CREATININE 0.91 1.62* 2.37*   Estimated Creatinine Clearance: 29.7 mL/min (by C-G formula based on Cr of 2.37).  Recent Labs  01/10/15 0203 01/10/15 1121  VANCOTROUGH 20  --   VANCORANDOM  --  17     Microbiology: Recent Results (from the past 720 hour(s))  Blood culture (routine x 2)     Status: None (Preliminary result)   Collection Time: 01/08/15  8:46 AM  Result Value Ref Range Status   Specimen Description BLOOD LEFT ASSIST CONTROL  Final   Special Requests   Final    BOTTLES DRAWN AEROBIC AND ANAEROBIC  5CC AERO 2CC ANAERO   Culture NO GROWTH 2 DAYS  Final   Report Status PENDING  Incomplete  Blood culture (routine x 2)     Status: None (Preliminary result)   Collection Time: 01/08/15  9:29 AM  Result Value Ref Range Status   Specimen Description BLOOD RIGHT ARM  Final   Special Requests BOTTLES DRAWN AEROBIC AND ANAEROBIC  1CC  Final   Culture NO GROWTH 2 DAYS  Final   Report Status PENDING  Incomplete  MRSA PCR Screening     Status: None   Collection Time: 01/08/15 11:53 AM  Result Value Ref Range Status   MRSA by PCR NEGATIVE NEGATIVE Final    Comment:        The GeneXpert MRSA Assay (FDA approved for NASAL specimens only), is one component of  a comprehensive MRSA colonization surveillance program. It is not intended to diagnose MRSA infection nor to guide or monitor treatment for MRSA infections.   Wound culture     Status: None (Preliminary result)   Collection Time: 01/08/15  4:18 PM  Result Value Ref Range Status   Specimen Description WOUND  Final   Special Requests Immunocompromised  Final   Gram Stain   Final    FEW WBC SEEN MODERATE GRAM POSITIVE COCCI IN PAIRS IN CHAINS    Culture   Final    MODERATE GROWTH STREPTOCOCCUS GROUP G There is no known Penicillin Resistant Beta Streptococcus in the [place] For patients that are Penicillin-allergic, Erythromycin is 85-94% susceptible, and Clindamycin is 80% susceptible.  Contact Microbiology within 7 days if sensitivity testing is  required.      Report Status PENDING  Incomplete    Medical History: Past Medical History  Diagnosis Date  . Diabetes mellitus with neuropathy (HCC)   . Benign essential HTN   . PVD (peripheral vascular disease) (HCC)     Medications:  Scheduled:  . antiseptic oral rinse  7 mL Mouth Rinse BID  . aspirin  81 mg Oral Daily  . atorvastatin  80 mg Oral QHS  . clopidogrel  75 mg Oral Daily  . collagenase   Topical Daily  . folic acid  1  mg Oral Daily  . gabapentin  300 mg Oral TID  . insulin aspart  0-15 Units Subcutaneous TID WC  . insulin aspart  0-5 Units Subcutaneous QHS  . insulin glargine  60 Units Subcutaneous QHS  . multivitamin with minerals  1 tablet Oral Daily  . piperacillin-tazobactam (ZOSYN)  IV  3.375 g Intravenous 3 times per day  . senna-docusate  1 tablet Oral Daily  . thiamine  100 mg Oral Daily  . vancomycin  1,000 mg Intravenous Q24H   Infusions:  . sodium chloride 100 mL/hr at 01/10/15 1456   Assessment: 64 y/o M with LLE cellulitis without evidence of osteomyelitis ordered empiric antibiotics for cellulitis.    Goal of Therapy:  Vancomycin trough level 10-15 mcg/ml  Plan:  Will continue Zosyn 3.375 g  EI q 8 hours.   Will transition patient to vancomycin 1g IV q36hr and obtain follow-up trough with dose prior to dose on 12/26.    Pharmacy will continue to monitor and adjust per consult.    Johnny Lane 01/10/2015,3:28 PM

## 2015-01-10 NOTE — Care Management (Signed)
Received telephone call from [place] at [facility] updated information. Johnny NajjarLarry will [facility] Tuesday for [facility] summary [facility]. I spoke with patient and he wants to go for rehab at a [place] facility and transition to LTC under Medicaid which he states he has. He does not want to go out of this county due to family having to travel. He and daughter would like to see if [facility] has a bed. [facility]. Johnny NajjarLarry said forms were received on admission to this hospital and patient decline to transfer. Gavin PoundDeborah CSW updated.

## 2015-01-10 NOTE — Progress Notes (Signed)
Daily Progress Note   Subjective  - 1 Day Post-Op  F/u left lower leg cellulitis.  S/p percutaneous revascularization.  State feels much better today.  Objective Filed Vitals:   01/09/15 2029 01/09/15 2200 01/10/15 0448 01/10/15 0759  BP: 97/47 102/53 94/43 92/47   Pulse: 86 83 83 86  Temp: 98.6 F (37 C)  99.2 F (37.3 C) 98.4 F (36.9 C)  TempSrc: Oral  Oral Oral  Resp: 18  18 18   Height:      Weight:      SpO2: 100%  94% 93%    Physical Exam: Diffuse erythema to left lower leg is decreased in intensity today. Much less pain with ROM of ankle and leg.   Posterior heel decubitus blister open and remaining eschar of heel intact.  Mid arch necrotic skin island noted.  Necrosis of partial 3rd and 4th toes noted.   No open purulent ulcerations at this time.   Right lateral heel ulcer is small and stable <1cm and very superficial.  Laboratory CBC    Component Value Date/Time   WBC 23.6* 01/10/2015 0203   WBC 8.8 12/25/2013 1136   HGB 12.2* 01/10/2015 0203   HGB 14.2 12/25/2013 1136   HCT 36.8* 01/10/2015 0203   HCT 43.7 12/25/2013 1136   PLT 255 01/10/2015 0203   PLT 210 12/25/2013 1136    BMET    Component Value Date/Time   NA 129* 01/10/2015 0203   NA 140 12/25/2013 1136   K 4.0 01/10/2015 0203   K 3.9 12/25/2013 1136   CL 93* 01/10/2015 0203   CL 107 12/25/2013 1136   CO2 31 01/10/2015 0203   CO2 30 12/25/2013 1136   GLUCOSE 238* 01/10/2015 0203   GLUCOSE 269* 12/25/2013 1136   BUN 30* 01/10/2015 0203   BUN 21* 12/25/2013 1136   CREATININE 2.37* 01/10/2015 0203   CREATININE 0.71 12/25/2013 1136   CALCIUM 6.5* 01/10/2015 0203   CALCIUM 8.4* 12/25/2013 1136   GFRNONAA 27* 01/10/2015 0203   GFRNONAA >60 12/25/2013 1136   GFRAA 31* 01/10/2015 0203   GFRAA >60 12/25/2013 1136    Assessment/Planning: Pt with severe PVD with cellulitis and multiple necrotic eschars s/p revascularization. At this time I would not recommend surgical debridment of wounds and  prefer to begin with enzymatic debridment and local wound care.  If further demarcation occurs can consider future surgical debridment/amputation as appropriate.  New dressings applied today.  Recommend daily heel protector dressing changes (allevyn heel dressings) b/l.  Will start Santyl ointment to all necrotic wound sites daily  Will need close routine f/u outpt and recommend referral wound care center upon discharge.My benefit from hyperbaric O2  Orders for dressing changes written.  F/u with podiatry in 2 weeks in outpt clinic.  Gwyneth RevelsFowler, Janeshia Ciliberto A  01/10/2015, 12:21 PM

## 2015-01-10 NOTE — Progress Notes (Signed)
Per Pharmacy Nate do not give Vanc due to labs.

## 2015-01-10 NOTE — Progress Notes (Signed)
[facility]  Daily Progress Note   Subjective  - 1 Day Post-Op  Patient sleeping Out on the floor now No major events overnight  Objective Filed Vitals:   01/09/15 2029 01/09/15 2200 01/10/15 0448 01/10/15 0759  BP: 97/47 102/53 94/43 92/47   Pulse: 86 83 83 86  Temp: 98.6 F (37 C)  99.2 F (37.3 C) 98.4 F (36.9 C)  TempSrc: Oral  Oral Oral  Resp: 18  18 18   Height:      Weight:      SpO2: 100%  94% 93%    Intake/Output Summary (Last 24 hours) at 01/10/15 1257 Last data filed at 01/10/15 1015  Gross per 24 hour  Intake 2436.67 ml  Output      2 ml  Net 2434.67 ml    PULM  CTAB CV  RRR VASC  Access site C/D/I Feet wounds dressed today  Laboratory CBC    Component Value Date/Time   WBC 23.6* 01/10/2015 0203   WBC 8.8 12/25/2013 1136   HGB 12.2* 01/10/2015 0203   HGB 14.2 12/25/2013 1136   HCT 36.8* 01/10/2015 0203   HCT 43.7 12/25/2013 1136   PLT 255 01/10/2015 0203   PLT 210 12/25/2013 1136    BMET    Component Value Date/Time   NA 129* 01/10/2015 0203   NA 140 12/25/2013 1136   K 4.0 01/10/2015 0203   K 3.9 12/25/2013 1136   CL 93* 01/10/2015 0203   CL 107 12/25/2013 1136   CO2 31 01/10/2015 0203   CO2 30 12/25/2013 1136   GLUCOSE 238* 01/10/2015 0203   GLUCOSE 269* 12/25/2013 1136   BUN 30* 01/10/2015 0203   BUN 21* 12/25/2013 1136   CREATININE 2.37* 01/10/2015 0203   CREATININE 0.71 12/25/2013 1136   CALCIUM 6.5* 01/10/2015 0203   CALCIUM 8.4* 12/25/2013 1136   GFRNONAA 27* 01/10/2015 0203   GFRNONAA >60 12/25/2013 1136   GFRAA 31* 01/10/2015 0203   GFRAA >60 12/25/2013 1136    Assessment/Planning: POD # 1 s/p LE angiogram with revascularization   Blood flow optimized on the left leg  Still with disease on the right, and if ulcer does not heal in near future would recommend RLE angiogram as well.  Follow up in office in 3 weeks or so with ABIs   Podiatry following as well    Johnny Lane  01/10/2015,  12:57 PM

## 2015-01-10 NOTE — Clinical Social Work Note (Signed)
Clinical Social Work Assessment  Patient Details  Name: Johnny RastDavid Dominik MRN: [id number] Date of Birth: 03/15/1950  Date of referral:  01/10/15               Reason for consult:  Facility Placement                Permission sought to share information with:  Facility Medical sales representativeContact Representative, Family Supports Permission granted to share information::  Yes, Verbal Permission Granted  Name::      (Sister  cindy [phone])  Agency::     Relationship::     Contact Information:     Housing/Transportation Living arrangements for the past 2 months:  Apartment Source of Information:  Patient, Other (Comment Required) (sister) Patient Interpreter Needed:  None Criminal Activity/Legal Involvement Pertinent to Current Situation/Hospitalization:    Significant Relationships:  Siblings Lives with:  Self Do you feel safe going back to the place where you live?  No Need for family participation in patient care:     Care giving concerns:  Patient concerned unable to care for self.   Social Worker assessment / plan:  Visual merchandiserClinical Social Worker (CSW) consult for SNF placement, PT is recommending STR to assist with getting patient back to previous level of functioning.   Patient was alert, oriented and engaged in conversation with CSW.  (Additional Information provided by patient's sister  Arline AspCindy at bedside).  CSW introduced self and explained role of CSW department.  Patient currently lives alone.  He has no children and has never been married.    CSW explained that PT is recommending rehab.  Patient has been to SNF. CSW reviewed SNF process with patient, Patient is agreeable to SNF search and prefers [facility].  CSW explained that in order for Medicare  to pay for rehab patient will need a 3 night qualifying inpatient stay. Patient will meet the 3 night stay requirement, 01/10/15 is his third night   CSW will complete FL2, and fax out for available bed in anticipation of discharging to SNF for rehab.    Employment status:  Disabled (Comment on whether or not currently receiving Disability) Insurance information:  Medicare PT Recommendations:  Skilled Nursing Facility Information / Referral to community resources:  Skilled Nursing Facility  Patient/Family's Response to care:  Patient was appreciative of information provided by CSW.  Patient/Family's Understanding of and Emotional Response to Diagnosis, Current Treatment, and Prognosis:  Patient understands that he is under continued medical work up at this time.  Once medically stable he will discharge to SNF.  Emotional Assessment Appearance:  Appears stated age Attitude/Demeanor/Rapport:    Affect (typically observed):  Accepting, Adaptable Orientation:  Oriented to Self, Oriented to Place, Oriented to  Time, Oriented to Situation Alcohol / Substance use:    Psych involvement (Current and /or in the community):  No (Comment)  Discharge Needs  Concerns to be addressed:  Care Coordination, Discharge Planning Concerns Readmission within the last 30 days:  No Current discharge risk:  Chronically ill, Dependent with Mobility, Lives alone Barriers to Discharge:  Continued Medical Work up   Soundra PilonMoore, Levita Monical H, LCSW 01/10/2015, 3:38 PM

## 2015-01-11 DIAGNOSIS — L8990 Pressure ulcer of unspecified site, unspecified stage: Secondary | ICD-10-CM | POA: Insufficient documentation

## 2015-01-11 LAB — CBC
HCT: 39.9 % — ABNORMAL LOW (ref 40.0–52.0)
Hemoglobin: 13.1 g/dL (ref 13.0–18.0)
MCH: 31.1 pg (ref 26.0–34.0)
MCHC: 32.9 g/dL (ref 32.0–36.0)
MCV: 94.5 fL (ref 80.0–100.0)
PLATELETS: 281 10*3/uL (ref 150–440)
RBC: 4.22 MIL/uL — AB (ref 4.40–5.90)
RDW: 14 % (ref 11.5–14.5)
WBC: 25.2 10*3/uL — AB (ref 3.8–10.6)

## 2015-01-11 LAB — BLOOD GAS, VENOUS
Acid-Base Excess: 12.3 mmol/L — ABNORMAL HIGH (ref 0.0–3.0)
BICARBONATE: 39.2 meq/L — AB (ref 21.0–28.0)
PATIENT TEMPERATURE: 37
PH VEN: 7.43 (ref 7.320–7.430)
pCO2, Ven: 59 mmHg (ref 44.0–60.0)

## 2015-01-11 LAB — BASIC METABOLIC PANEL
Anion gap: 8 (ref 5–15)
BUN: 34 mg/dL — AB (ref 6–20)
CALCIUM: 6.9 mg/dL — AB (ref 8.9–10.3)
CO2: 33 mmol/L — ABNORMAL HIGH (ref 22–32)
CREATININE: 2.9 mg/dL — AB (ref 0.61–1.24)
Chloride: 94 mmol/L — ABNORMAL LOW (ref 101–111)
GFR calc non Af Amer: 21 mL/min — ABNORMAL LOW (ref >60)
GFR, EST AFRICAN AMERICAN: 24 mL/min — AB (ref >60)
Glucose, Bld: 132 mg/dL — ABNORMAL HIGH (ref 65–99)
Potassium: 4 mmol/L (ref 3.5–5.1)
SODIUM: 135 mmol/L (ref 135–145)

## 2015-01-11 LAB — GLUCOSE, CAPILLARY
GLUCOSE-CAPILLARY: 113 mg/dL — AB (ref 65–99)
GLUCOSE-CAPILLARY: 122 mg/dL — AB (ref 65–99)
GLUCOSE-CAPILLARY: 72 mg/dL (ref 65–99)
Glucose-Capillary: 95 mg/dL (ref 65–99)
Glucose-Capillary: 140 mg/dL — ABNORMAL HIGH (ref 65–99)

## 2015-01-11 MED ORDER — LORAZEPAM 1 MG PO TABS
1.0000 mg | ORAL_TABLET | Freq: Four times a day (QID) | ORAL | Status: AC | PRN
  Administered 2015-01-13: 1 mg via ORAL
  Filled 2015-01-11 (×3): qty 1

## 2015-01-11 MED ORDER — LORAZEPAM 2 MG/ML IJ SOLN
1.0000 mg | Freq: Four times a day (QID) | INTRAMUSCULAR | Status: AC | PRN
  Administered 2015-01-12 (×2): 1 mg via INTRAVENOUS
  Filled 2015-01-11 (×2): qty 1

## 2015-01-11 NOTE — Progress Notes (Signed)
[facility] [place] at [facility]   PATIENT NAME: Johnny RastDavid Tullier    MR#:  [id number]  DATE OF BIRTH:  03/08/1950  SUBJECTIVE:  CHIEF COMPLAINT:   Chief Complaint  Patient presents with  . Insect Bite   left foot pain  REVIEW OF SYSTEMS:  CONSTITUTIONAL: No fever, fatigue or weakness.  EYES: No blurred or double vision.  EARS, NOSE, AND THROAT: No tinnitus or ear pain.  RESPIRATORY: No cough, shortness of breath, wheezing or hemoptysis.  CARDIOVASCULAR: No chest pain, orthopnea, edema.  GASTROINTESTINAL: No nausea, vomiting, diarrhea or abdominal pain.  GENITOURINARY: No dysuria, hematuria.  ENDOCRINE: No polyuria, nocturia,  HEMATOLOGY: No anemia, easy bruising or bleeding SKIN: No rash or lesion. MUSCULOSKELETAL:  left foot pain NEUROLOGIC: No tingling, numbness, weakness.  PSYCHIATRY: No anxiety or depression.   DRUG ALLERGIES:  No Known Allergies  VITALS:  Blood pressure 126/60, pulse 91, temperature 99.6 F (37.6 C), temperature source Oral, resp. rate 18, height 5\' 8"  (1.727 m), weight 81.647 kg (180 lb), SpO2 95 %.  PHYSICAL EXAMINATION:  GENERAL:  64 y.o.-year-old patient lying in the bed with no acute distress.  EYES: Pupils equal, round, reactive to light and accommodation. No scleral icterus. Extraocular muscles intact.  HEENT: Head atraumatic, normocephalic. Oropharynx and nasopharynx clear.  NECK:  Supple, no jugular venous distention. No thyroid enlargement, no tenderness.  LUNGS: Normal breath sounds bilaterally, no wheezing, rales,rhonchi or crepitation. No use of accessory muscles of respiration.  CARDIOVASCULAR: S1, S2 normal. No murmurs, rubs, or gallops.  ABDOMEN: Soft, nontender, nondistended. Bowel sounds present. No organomegaly or mass.  EXTREMITIES: No pedal edema, cyanosis, or clubbing.  left foot and leg tenderness, warmness and erythema under knee, foot in dressing. NEUROLOGIC: Cranial nerves II through XII are intact.  Muscle strength 4/5 in all extremities. Sensation intact. Gait not checked.  PSYCHIATRIC: The patient is alert and oriented x 3.  SKIN: No obvious rash, lesion, or ulcer.    LABORATORY PANEL:   CBC  Recent Labs Lab 01/11/15 0339  WBC 25.2*  HGB 13.1  HCT 39.9*  PLT 281   ------------------------------------------------------------------------------------------------------------------  Chemistries   Recent Labs Lab 01/08/15 0846  01/10/15 1121 01/11/15 0339  NA 128*  < >  --  135  K 5.0  < >  --  4.0  CL 87*  < >  --  94*  CO2 30  < >  --  33*  GLUCOSE 833*  < >  --  132*  BUN 19  < >  --  34*  CREATININE 0.91  < >  --  2.90*  CALCIUM 7.2*  < >  --  6.9*  MG  --   < > 2.8*  --   AST 14*  --   --   --   ALT 14*  --   --   --   ALKPHOS 127*  --   --   --   BILITOT 2.1*  --   --   --   < > = values in this interval not displayed. ------------------------------------------------------------------------------------------------------------------  Cardiac Enzymes No results for input(s): TROPONINI in the last 168 hours. ------------------------------------------------------------------------------------------------------------------  RADIOLOGY:  No results found.  EKG:   Orders placed or performed during the hospital encounter of 01/08/15  . EKG 12-Lead  . EKG 12-Lead    ASSESSMENT AND PLAN:   1. Cellulitis and Gangrene left foot with known PAD:  Wound culture showed MODERATE GRAM POSITIVE COCCI IN PAIRS  IN CHAINS. continue Zosyn and discontinue vancomycin, follow up Blood cultures. X-ray does not show evidence of osteomyelitis.  Pain control.  Per Dr. Ether Griffins, would not recommend surgical debridment of wounds and prefer to begin with enzymatic debridment and local wound care.  POD # 2 s/p LE angiogram with revascularization.  Per Dr. Wyn Quaker,  Blood flow optimized on the left leg. Still with disease on the right, and if ulcer does not heal in near future would  recommend RLE angiogram as well. Follow up in office in 3 weeks or so with ABIs   * Leukocytosis. Worsening, Follow-up CBC.  2. DM2: Controlled. Off insulin drip. On lantus 60 units HS and moderate-dose sliding scale insulin. discontinued glipizide and metformin.  3. Essential hypertension: controlled, discontinue lisinopril due to low blood pressure and acute renal failure. He reports he does not take metoprolol as prescribed to him due to side effects.  4. CAD: resumed aspirin and Plavix after procedure. Continue atorvastatin.  5. Ischemic cardiomyopathy EF 35%: Patient states he does not take Lasix due to the side effect of urinating frequently. Watch his fluid status. discontinue lisinopril.  6. Tobacco dependence: Patient states he is trying to quit smoking. Quit back in 2015 he sometimes slips up. Patient is encouraged to continue to stop smoking. Patient counseled 3 minutes.  7. EtOH abuse: Patient reports he drinks 1-2 beers a day. He reports he has not had withdrawal. Monitor for signs of withdrawal. On CIWA protocol.   * ARF.  possible due to contrast. worsening, hold lisinopril and lasix. Discontinue vancomycin. Continue NS iv, follow up BMP. Nephrology consult. * Hyponatremia. Hold lasix. Improved with NS iv, follow up BMP. * Hypokalemia.  improved with K supplement..   PT evaluation suggested skilled nursing facility placement. All the records are reviewed and case discussed with Care Management/Social Workerr. Management plans discussed with the patient, his wife  and they are in agreement.  CODE STATUS: DO NOT RESUSCITATE  TOTAL TIME TAKING CARE OF THIS PATIENT: 43 minutes.  Greater than 50% time was spent on coordination of care and face-to-face counseling.  POSSIBLE D/C TO SKILLED NURSING FACILITY IN 3 DAYS, DEPENDING ON CLINICAL CONDITION.   Shaune Pollack M.D on 01/11/2015 at 2:46 PM  Between 7am to 6pm - Pager - [phone]  After 6pm go to [email or web address] -  password [email or web address] [facility]  [facility]  Office  [phone]  CC: Primary care physician; Leotis Shames, MD

## 2015-01-11 NOTE — Clinical Social Work Note (Signed)
LCSW reveiwed Dr Nicky Pughhen's notes and on Dec 23rd he reported patient to be discharged in 3 days. No further review required.  Ulices Maack Johnella MoloneyBandi LCSW Clinical Social Worker [phone]

## 2015-01-12 ENCOUNTER — Inpatient Hospital Stay: Payer: Medicare Other

## 2015-01-12 LAB — BASIC METABOLIC PANEL
Anion gap: 6 (ref 5–15)
BUN: 42 mg/dL — AB (ref 6–20)
CHLORIDE: 97 mmol/L — AB (ref 101–111)
CO2: 29 mmol/L (ref 22–32)
Calcium: 6.2 mg/dL — CL (ref 8.9–10.3)
Creatinine, Ser: 3.27 mg/dL — ABNORMAL HIGH (ref 0.61–1.24)
GFR calc Af Amer: 21 mL/min — ABNORMAL LOW (ref >60)
GFR calc non Af Amer: 18 mL/min — ABNORMAL LOW (ref >60)
GLUCOSE: 144 mg/dL — AB (ref 65–99)
POTASSIUM: 4.2 mmol/L (ref 3.5–5.1)
Sodium: 132 mmol/L — ABNORMAL LOW (ref 135–145)

## 2015-01-12 LAB — PROTEIN / CREATININE RATIO, URINE
CREATININE, URINE: 58 mg/dL
Protein Creatinine Ratio: 1.28 mg/mg{Cre} — ABNORMAL HIGH (ref 0.00–0.15)
TOTAL PROTEIN, URINE: 74 mg/dL

## 2015-01-12 LAB — CBC
HEMATOCRIT: 31.9 % — AB (ref 40.0–52.0)
Hemoglobin: 10.8 g/dL — ABNORMAL LOW (ref 13.0–18.0)
MCH: 31.3 pg (ref 26.0–34.0)
MCHC: 34 g/dL (ref 32.0–36.0)
MCV: 92 fL (ref 80.0–100.0)
PLATELETS: 243 10*3/uL (ref 150–440)
RBC: 3.46 MIL/uL — AB (ref 4.40–5.90)
RDW: 14 % (ref 11.5–14.5)
WBC: 19.8 10*3/uL — AB (ref 3.8–10.6)

## 2015-01-12 LAB — GLUCOSE, CAPILLARY
GLUCOSE-CAPILLARY: 54 mg/dL — AB (ref 65–99)
GLUCOSE-CAPILLARY: 79 mg/dL (ref 65–99)
Glucose-Capillary: 116 mg/dL — ABNORMAL HIGH (ref 65–99)
Glucose-Capillary: 42 mg/dL — CL (ref 65–99)
Glucose-Capillary: 55 mg/dL — ABNORMAL LOW (ref 65–99)
Glucose-Capillary: 57 mg/dL — ABNORMAL LOW (ref 65–99)
Glucose-Capillary: 96 mg/dL (ref 65–99)
Glucose-Capillary: 93 mg/dL (ref 65–99)

## 2015-01-12 LAB — WOUND CULTURE

## 2015-01-12 MED ORDER — INSULIN ASPART 100 UNIT/ML ~~LOC~~ SOLN: 0.0000 [IU] | Freq: Every day | SUBCUTANEOUS | Status: DC

## 2015-01-12 MED ORDER — INSULIN GLARGINE 100 UNIT/ML ~~LOC~~ SOLN
50.0000 [IU] | Freq: Every day | SUBCUTANEOUS | Status: DC
  Administered 2015-01-12: 50 [IU] via SUBCUTANEOUS
  Filled 2015-01-12 (×2): qty 0.5

## 2015-01-12 MED ORDER — SODIUM CHLORIDE 0.9 % IV SOLN
1.0000 g | Freq: Once | INTRAVENOUS | Status: AC
  Administered 2015-01-12: 1 g via INTRAVENOUS
  Filled 2015-01-12 (×2): qty 10

## 2015-01-12 MED ORDER — INSULIN ASPART 100 UNIT/ML ~~LOC~~ SOLN
0.0000 [IU] | Freq: Three times a day (TID) | SUBCUTANEOUS | Status: DC
  Administered 2015-01-13: 5 [IU] via SUBCUTANEOUS
  Filled 2015-01-12: qty 5

## 2015-01-12 MED ORDER — SODIUM CHLORIDE 0.9 % IV SOLN
2.0000 g | Freq: Three times a day (TID) | INTRAVENOUS | Status: DC
  Administered 2015-01-12: 2 g via INTRAVENOUS
  Filled 2015-01-12 (×3): qty 2000

## 2015-01-12 NOTE — Progress Notes (Signed)
Patient had low blood sugar, Dr. Imogene Burnhen alerted on floor patient did not eat and no am insulin was given. Food and juice given after checking.  Will recheck

## 2015-01-12 NOTE — Progress Notes (Signed)
Called Dr. Imogene Burnhen about patient fingerstick, orders given, patient asymptomatic, talking laughing and eating lunch.

## 2015-01-12 NOTE — Consult Note (Signed)
Date: 01/12/2015                  Patient Name:  Johnny Lane  MRN: [id number]  DOB: 07-Jul-1950  Age / Sex: 64 y.o., male         PCP: Leotis Shames, MD                 Service Requesting Consult:  internal medicine                  Reason for Consult:  acute renal failure             History of Present Illness: Patient is a 64 y.o. male with medical problems of peripheral vascular disease,poorly controlled diabetes with neuropathy, hypertension, ischemic cardiomyopathy with EF of 35%, peripheral artery disease with bilateral lower extremity ulceration who was admitted to [facility] on 01/08/2015 for evaluation of cellulitis with necrosis of left foot.  Patient is being treated with IV antibiotics. Please note that patient is very somnolent as present. He did not wake up to provide any information. All history and information is from the chart. Patient has poorly controlled diabetes with most recent hemoglobin A1c of 13.8% at the time of admission  Baseline creatinine is normal at 0.91 at the time of admission on December 21 Since then it has progressively increased and today's creatinine is 3.27 During the hospital course, patient underwent aortogram and left lower extremity revascularization requiring IV contrast exposure. Right renal artery was noted to be excluded.  Nephrology consult has been requested for evaluation No renal imaging is available     Medications: Outpatient medications: Prescriptions prior to admission  Medication Sig Dispense Refill Last Dose  . albuterol (ACCUNEB) 0.63 MG/3ML nebulizer solution Inhale 3 mLs into the lungs every 6 (six) hours as needed for wheezing or shortness of breath.    PRN  . aspirin EC 81 MG tablet Take 81 mg by mouth daily.   01/07/2015 at am  . atorvastatin (LIPITOR) 80 MG tablet Take 80 mg by mouth at bedtime.   01/06/2015 at pm  . clopidogrel (PLAVIX) 75 MG tablet Take 75 mg by mouth daily.   01/07/2015 at am  . gabapentin (NEURONTIN)  300 MG capsule Take 300 mg by mouth 3 (three) times daily.   01/07/2015 at pm  . glipiZIDE (GLUCOTROL) 10 MG tablet Take 10 mg by mouth 2 (two) times daily before a meal.   01/07/2015 at pm  . insulin glargine (LANTUS) 100 UNIT/ML injection Inject 60 Units into the skin at bedtime.   01/07/2015 at pm  . insulin lispro (HUMALOG) 100 UNIT/ML injection Inject 22 Units into the skin 3 (three) times daily before meals.   01/07/2015 at pm  . lisinopril (PRINIVIL,ZESTRIL) 10 MG tablet Take 10 mg by mouth daily.   01/07/2015 at am  . metFORMIN (GLUCOPHAGE) 500 MG tablet Take 1,000 mg by mouth daily with breakfast.   01/07/2015 at am  . naproxen (NAPROSYN) 500 MG tablet Take 500 mg by mouth 2 (two) times daily with a meal.   01/06/2015 at pm  . senna-docusate (SENOKOT-S) 8.6-50 MG tablet Take 1 tablet by mouth daily.   01/07/2015 at pm    Current medications: Current Facility-Administered Medications  Medication Dose Route Frequency Provider Last Rate Last Dose  . 0.9 %  sodium chloride infusion   Intravenous Continuous Shaune Pollack, MD 100 mL/hr at 01/11/15 2159    . acetaminophen (TYLENOL) tablet 650 mg  650 mg Oral Q6H  PRN Adrian Saran, MD   650 mg at 01/09/15 0538   Or  . acetaminophen (TYLENOL) suppository 650 mg  650 mg Rectal Q6H PRN Adrian Saran, MD      . alum & mag hydroxide-simeth (MAALOX/MYLANTA) 200-200-20 MG/5ML suspension 30 mL  30 mL Oral Q6H PRN Adrian Saran, MD      . antiseptic oral rinse (CPC / CETYLPYRIDINIUM CHLORIDE 0.05%) solution 7 mL  7 mL Mouth Rinse BID Adrian Saran, MD   7 mL at 01/12/15 0905  . aspirin chewable tablet 81 mg  81 mg Oral Daily Shaune Pollack, MD   81 mg at 01/12/15 6962  . atorvastatin (LIPITOR) tablet 80 mg  80 mg Oral QHS Adrian Saran, MD   80 mg at 01/11/15 2156  . bisacodyl (DULCOLAX) EC tablet 5 mg  5 mg Oral Daily PRN Adrian Saran, MD      . clopidogrel (PLAVIX) tablet 75 mg  75 mg Oral Daily Shaune Pollack, MD   75 mg at 01/12/15 0905  . collagenase (SANTYL) ointment    Topical Daily Shaune Pollack, MD      . folic acid (FOLVITE) tablet 1 mg  1 mg Oral Daily Adrian Saran, MD   1 mg at 01/12/15 0905  . gabapentin (NEURONTIN) capsule 300 mg  300 mg Oral TID Adrian Saran, MD   300 mg at 01/12/15 0904  . insulin aspart (novoLOG) injection 0-15 Units  0-15 Units Subcutaneous TID WC Shaune Pollack, MD   2 Units at 01/11/15 1707  . insulin aspart (novoLOG) injection 0-5 Units  0-5 Units Subcutaneous QHS Shaune Pollack, MD   4 Units at 01/09/15 2155  . insulin glargine (LANTUS) injection 60 Units  60 Units Subcutaneous QHS Adrian Saran, MD   60 Units at 01/11/15 2156  . LORazepam (ATIVAN) tablet 1 mg  1 mg Oral Q6H PRN Shaune Pollack, MD       Or  . LORazepam (ATIVAN) injection 1 mg  1 mg Intravenous Q6H PRN Shaune Pollack, MD   1 mg at 01/12/15 0640  . menthol-cetylpyridinium (CEPACOL) lozenge 3 mg  1 lozenge Oral PRN Oralia Manis, MD      . morphine 4 MG/ML injection 4 mg  4 mg Intravenous Q4H PRN Shaune Pollack, MD   4 mg at 01/09/15 1140  . multivitamin with minerals tablet 1 tablet  1 tablet Oral Daily Adrian Saran, MD   1 tablet at 01/12/15 0905  . ondansetron (ZOFRAN) tablet 4 mg  4 mg Oral Q6H PRN Adrian Saran, MD       Or  . ondansetron (ZOFRAN) injection 4 mg  4 mg Intravenous Q6H PRN Adrian Saran, MD      . oxyCODONE-acetaminophen (PERCOCET/ROXICET) 5-325 MG per tablet 1-2 tablet  1-2 tablet Oral Q4H PRN Shaune Pollack, MD   2 tablet at 01/12/15 0547  . piperacillin-tazobactam (ZOSYN) IVPB 3.375 g  3.375 g Intravenous 3 times per day Bertram Savin, RPH   3.375 g at 01/12/15 0547  . senna-docusate (Senokot-S) tablet 1 tablet  1 tablet Oral Daily Adrian Saran, MD   1 tablet at 01/12/15 0904  . senna-docusate (Senokot-S) tablet 1 tablet  1 tablet Oral QHS PRN Adrian Saran, MD      . thiamine (VITAMIN B-1) tablet 100 mg  100 mg Oral Daily Adrian Saran, MD   100 mg at 01/12/15 9528      Allergies: No Known Allergies    Past Medical History: Past Medical History  Diagnosis Date  .  Diabetes mellitus with  neuropathy (HCC)   . Benign essential HTN   . PVD (peripheral vascular disease) (HCC)      Past Surgical History: History reviewed. No pertinent past surgical history.   Family History: History reviewed. No pertinent family history.   Social History: Social History   Social History  . Marital Status: Single    Spouse Name: N/A  . Number of Children: N/A  . Years of Education: N/A   Occupational History  . Not on file.   Social History Main Topics  . Smoking status: Former Games developer  . Smokeless tobacco: Not on file  . Alcohol Use: Not on file  . Drug Use: Not on file  . Sexual Activity: Not on file   Other Topics Concern  . Not on file   Social History Narrative  . No narrative on file     Review of Systems: Not available due to patient's mental status Gen:  HEENT:  CV:  Resp:  GI: GU :  MS:  Derm:   Psych: Heme:  Neuro:  Endocrine  Vital Signs: Blood pressure 103/46, pulse 100, temperature 99.9 F (37.7 C), temperature source Oral, resp. rate 18, height  (1.727 m), weight 81.647 kg (180 lb), SpO2 98 %.   Intake/Output Summary (Last 24 hours) at 01/12/15 1053 Last data filed at 01/12/15 0854  Gross per 24 hour  Intake 4201.66 ml  Output   1220 ml  Net 2981.66 ml    Weight trends: [employer]   01/08/15 0843  Weight: 81.647 kg (180 lb)    Physical Exam: General:  no acute distress, laying in the bed   HEENT  moist oral mucous membranes   Neck:  supple   Lungs:  apneic pattern of breathing, coarse rhonchi bilaterally, mild basilar crackles   Heart::  no rub or gallop   Abdomen:  soft, nontender, nondistended   Extremities:  no peripheral edema   Neurologic:  very lethargic, did not wake up for interview   Skin:  normal turgor   Access:   Foley:        Lab results: Basic Metabolic Panel:  Recent Labs Lab 01/09/15 0444 01/10/15 0203 01/10/15 1121 01/11/15 0339 01/12/15 0411  NA 129* 129*  --  135 132*  K 3.2* 4.0  --   4.0 4.2  CL 93* 93*  --  94* 97*  CO2 32 31  --  33* 29  GLUCOSE 173* 238*  --  132* 144*  BUN 20 30*  --  34* 42*  CREATININE 1.62* 2.37*  --  2.90* 3.27*  CALCIUM 6.5* 6.5*  --  6.9* 6.2*  MG 2.8*  --  2.8*  --   --     Liver Function Tests:  Recent Labs Lab 01/08/15 0846  AST 14*  ALT 14*  ALKPHOS 127*  BILITOT 2.1*  PROT 6.0*  ALBUMIN 2.2*   No results for input(s): LIPASE, AMYLASE in the last 168 hours. No results for input(s): AMMONIA in the last 168 hours.  CBC:  Recent Labs Lab 01/08/15 0846  01/11/15 0339 01/12/15 0411  WBC 22.5*  < > 25.2* 19.8*  NEUTROABS 20.7*  --   --   --   HGB 13.5  < > 13.1 10.8*  HCT 42.9  < > 39.9* 31.9*  MCV 95.1  < > 94.5 92.0  PLT 296  < > 281 243  < > = values in this interval not displayed.  Cardiac Enzymes: No  results for input(s): CKTOTAL, TROPONINI in the last 168 hours.  BNP: Invalid input(s): POCBNP  CBG:  Recent Labs Lab 01/11/15 1115 01/11/15 2042 01/11/15 2124 01/11/15 2220 01/12/15 0719  GLUCAP 122* 72 95 140* 116*    Microbiology: Recent Results (from the past 720 hour(s))  Blood culture (routine x 2)     Status: None (Preliminary result)   Collection Time: 01/08/15  8:46 AM  Result Value Ref Range Status   Specimen Description BLOOD LEFT ASSIST CONTROL  Final   Special Requests   Final    BOTTLES DRAWN AEROBIC AND ANAEROBIC  5CC AERO 2CC ANAERO   Culture NO GROWTH 4 DAYS  Final   Report Status PENDING  Incomplete  Blood culture (routine x 2)     Status: None (Preliminary result)   Collection Time: 01/08/15  9:29 AM  Result Value Ref Range Status   Specimen Description BLOOD RIGHT ARM  Final   Special Requests BOTTLES DRAWN AEROBIC AND ANAEROBIC  1CC  Final   Culture NO GROWTH 4 DAYS  Final   Report Status PENDING  Incomplete  MRSA PCR Screening     Status: None   Collection Time: 01/08/15 11:53 AM  Result Value Ref Range Status   MRSA by PCR NEGATIVE NEGATIVE Final    Comment:         The GeneXpert MRSA Assay (FDA approved for NASAL specimens only), is one component of a comprehensive MRSA colonization surveillance program. It is not intended to diagnose MRSA infection nor to guide or monitor treatment for MRSA infections.   Wound culture     Status: None   Collection Time: 01/08/15  4:18 PM  Result Value Ref Range Status   Specimen Description WOUND  Final   Special Requests Immunocompromised  Final   Gram Stain   Final    FEW WBC SEEN MODERATE GRAM POSITIVE COCCI IN PAIRS IN CHAINS    Culture   Final    MODERATE GROWTH STREPTOCOCCUS GROUP G There is no known Penicillin Resistant Beta Streptococcus in the [place] For patients that are Penicillin-allergic, Erythromycin is 85-94% susceptible, and Clindamycin is 80% susceptible.  Contact Microbiology within 7 days if sensitivity testing is  required.      Report Status 01/12/2015 FINAL  Final     Coagulation Studies: No results for input(s): LABPROT, INR in the last 72 hours.  Urinalysis: No results for input(s): COLORURINE, LABSPEC, PHURINE, GLUCOSEU, HGBUR, BILIRUBINUR, KETONESUR, PROTEINUR, UROBILINOGEN, NITRITE, LEUKOCYTESUR in the last 72 hours.  Invalid input(s): APPERANCEUR      Imaging:  No results found.   Assessment & Plan: Pt is a 64 y.o. yo male with a PMHX of peripheral vascular disease,poorly controlled diabetes with neuropathy, hypertension, ischemic cardiomyopathy with EF of 35%, peripheral artery disease with bilateral lower extremity ulceration , was admitted on 01/08/2015 with worsening left foot redness and swelling, subjective fevers, necrosis of the left  third and fourth toe.   1. Acute renal failure Likely multifactorial including sepsis, concurrent infection, IV contrast exposure, possible contribution from vancomycin  Baseline creatinine appears to be normal at 0.91 at the time of admission on December 21 Since then, creatinine has progressively worsened. Today's creatinine  is up to 3.27 - Electrolytes and volume status are acceptable No acute indication for dialysis at present - Does not appear to be prerenal, therefore would decrease the fluid to minimal maintenance required - Obtain renal ultrasound, urine protein/creatinine ratio - Follow electrolytes closely on a daily basis  2. Renal artery stenosis Incidental finding of occluded right renal artery during angiogram  3. Diabetes, poorly controlled Most recent hemoglobin A1c 13.8%

## 2015-01-12 NOTE — Progress Notes (Signed)
Patient resting in room, found to have tremors in both arms.  He states that he has had them for a while prior to admission.  Patient talks in his sleep and drifts to sleep during conversation.  Wound care completed by previous nurse on day shift.

## 2015-01-12 NOTE — Progress Notes (Addendum)
[facility] - [place] at [facility]   PATIENT NAME: Johnny RastDavid Lane    MR#:  [id number]  DATE OF BIRTH:  07/27/1950  SUBJECTIVE:  CHIEF COMPLAINT:   Chief Complaint  Patient presents with  . Insect Bite   the patient is AAO 3 but looks confused. Blood sugar was 57 before lunch. He is symptomatic and eating lunch.  REVIEW OF SYSTEMS:  CONSTITUTIONAL: No fever, fatigue or weakness.  EYES: No blurred or double vision.  EARS, NOSE, AND THROAT: No tinnitus or ear pain.  RESPIRATORY: No cough, shortness of breath, wheezing or hemoptysis.  CARDIOVASCULAR: No chest pain, orthopnea, edema.  GASTROINTESTINAL: No nausea, vomiting, diarrhea or abdominal pain.  GENITOURINARY: No dysuria, hematuria.  ENDOCRINE: No polyuria, nocturia,  HEMATOLOGY: No anemia, easy bruising or bleeding SKIN: No rash or lesion. MUSCULOSKELETAL:  left foot pain NEUROLOGIC: No tingling, numbness, weakness.  PSYCHIATRY: No anxiety or depression.   DRUG ALLERGIES:  No Known Allergies  VITALS:  Blood pressure 103/46, pulse 100, temperature 99.9 F (37.7 C), temperature source Oral, resp. rate 18, height 5\' 8"  (1.727 m), weight 81.647 kg (180 lb), SpO2 98 %.  PHYSICAL EXAMINATION:  GENERAL:  64 y.o.-year-old patient lying in the bed with no acute distress.  EYES: Pupils equal, round, reactive to light and accommodation. No scleral icterus. Extraocular muscles intact.  HEENT: Head atraumatic, normocephalic. Oropharynx and nasopharynx clear.  NECK:  Supple, no jugular venous distention. No thyroid enlargement, no tenderness.  LUNGS: Normal breath sounds bilaterally, no wheezing, rales,rhonchi or crepitation. No use of accessory muscles of respiration.  CARDIOVASCULAR: S1, S2 normal. No murmurs, rubs, or gallops.  ABDOMEN: Soft, nontender, nondistended. Bowel sounds present. No organomegaly or mass.  EXTREMITIES: No pedal edema, cyanosis, or clubbing.  left foot and leg tenderness, warmness and  erythema under knee, foot in dressing. NEUROLOGIC: Cranial nerves II through XII are intact. Muscle strength 4/5 in all extremities. Sensation intact. Gait not checked. No tremor. PSYCHIATRIC: The patient is alert and oriented x 3.  But looks confused. SKIN: No obvious rash, lesion, or ulcer.    LABORATORY PANEL:   CBC  Recent Labs Lab 01/12/15 0411  WBC 19.8*  HGB 10.8*  HCT 31.9*  PLT 243   ------------------------------------------------------------------------------------------------------------------  Chemistries   Recent Labs Lab 01/08/15 0846  01/10/15 1121  01/12/15 0411  NA 128*  < >  --   < > 132*  K 5.0  < >  --   < > 4.2  CL 87*  < >  --   < > 97*  CO2 30  < >  --   < > 29  GLUCOSE 833*  < >  --   < > 144*  BUN 19  < >  --   < > 42*  CREATININE 0.91  < >  --   < > 3.27*  CALCIUM 7.2*  < >  --   < > 6.2*  MG  --   < > 2.8*  --   --   AST 14*  --   --   --   --   ALT 14*  --   --   --   --   ALKPHOS 127*  --   --   --   --   BILITOT 2.1*  --   --   --   --   < > = values in this interval not displayed. ------------------------------------------------------------------------------------------------------------------  Cardiac Enzymes No results for input(s): TROPONINI  in the last 168 hours. ------------------------------------------------------------------------------------------------------------------  RADIOLOGY:  No results found.  EKG:   Orders placed or performed during the hospital encounter of 01/08/15  . EKG 12-Lead  . EKG 12-Lead    ASSESSMENT AND PLAN:   1. Cellulitis and Gangrene left foot with known PAD:  Wound culture showed MODERATE GRAM POSITIVE COCCI IN PAIRS IN CHAINS. continue Zosyn and discontinued vancomycin. Blood cultures are negative so far. X-ray does not show evidence of osteomyelitis.  Pain control.  Per Dr. Ether Griffins, would not recommend surgical debridment of wounds and prefer to begin with enzymatic debridment and local  wound care.  POD # 2 s/p LE angiogram with revascularization.  Per Dr. Wyn Quaker,  Blood flow optimized on the left leg. Still with disease on the right, and if ulcer does not heal in near future would recommend RLE angiogram as well. Follow up in office in 3 weeks or so with ABIs   * Leukocytosis. Improving, Follow-up CBC.  2. DM2: Controlled. But had one episode of hypoglycemia today. He was treated with insulin drip, but off insulin drip.  Decrease lantus 50 units HS and change moderate-dose to sensitive sliding scale insulin. discontinued glipizide and metformin.  3. Essential hypertension: controlled, discontinued lisinopril due to low blood pressure and acute renal failure. He reports he does not take metoprolol as prescribed to him due to side effects.  4. CAD: resumed aspirin and Plavix after procedure. Continue atorvastatin.  5. Ischemic cardiomyopathy EF 35%: Patient states he does not take Lasix due to the side effect of urinating frequently. Watch his fluid status. discontinued lisinopril.  6. Tobacco dependence: Patient states he is trying to quit smoking. Quit back in 2015 he sometimes slips up. Patient is encouraged to continue to stop smoking. Patient counseled 3 minutes.  7. EtOH abuse: Patient reports he drinks 1-2 beers a day. The patient is confused today, possible withdrawal. Continue CIWA protocol.     * ARF.  possible due to contrast. worsening, hold lisinopril and lasix. Discontinued vancomycin. Decrease NS iv to 50 cc per hour per Dr. Thedore Mins, follow up BMP. * Hyponatremia. Hold lasix. continue NS iv, follow up BMP. * Hypokalemia.  improved with K supplement..  * Renal artery stenosis Incidental finding of occluded right renal artery during angiogram.   PT evaluation suggested skilled nursing facility placement.  I discussed with Dr. Thedore Mins. All the records are reviewed and case discussed with Care Management/Social Workerr. Management plans discussed with the  patient, his wife  and they are in agreement.  CODE STATUS: DO NOT RESUSCITATE  TOTAL TIME TAKING CARE OF THIS PATIENT: 45 minutes.  Greater than 50% time was spent on coordination of care and face-to-face counseling.  POSSIBLE D/C TO SKILLED NURSING FACILITY IN 3 DAYS, DEPENDING ON CLINICAL CONDITION.   Shaune Pollack M.D on 01/12/2015 at 2:06 PM  Between 7am to 6pm - Pager - [phone]  After 6pm go to [email or web address] - password [email or web address] [facility]  [place] [facility]  Office  [phone]  CC: Primary care physician; Leotis Shames, MD

## 2015-01-12 NOTE — Progress Notes (Signed)
Gabapentin can cause excessive somnolence in the setting of acute renal failure therefore I have discontinued it. It can be restarted as necessary once renal function improves.

## 2015-01-12 NOTE — Progress Notes (Signed)
Pharmacy Antibiotic Follow-up Note  Johnny Lane is a 64 y.o. year-old male admitted on 01/08/2015.  The patient is currently on zosyn for cellulitis.  Assessment/Plan: After discussion with Dr. Imogene Burnhen, the current abx Zosyn will be narrowed to ampicillin as WBC still high and progress (likely PO at d/c).  Temp (24hrs), Avg:99.2 F (37.3 C), Min:98.5 F (36.9 C), Max:99.9 F (37.7 C)   Recent Labs Lab 01/08/15 0846 01/09/15 0444 01/10/15 0203 01/11/15 0339 01/12/15 0411  WBC 22.5* 21.9* 23.6* 25.2* 19.8*    Recent Labs Lab 01/08/15 0846 01/09/15 0444 01/10/15 0203 01/11/15 0339 01/12/15 0411  CREATININE 0.91 1.62* 2.37* 2.90* 3.27*   Estimated Creatinine Clearance: 21.5 mL/min (by C-G formula based on Cr of 3.27).    No Known Allergies    Microbiology results: 12/21 BCx: NGTD 12/21 MRSA PCR: negative 12/21 WCx: group G strep  Thank you for allowing pharmacy to be a part of this patient's care.  Marty HeckWang, Alanie Syler L PharmD 01/12/2015 3:56 PM

## 2015-01-12 NOTE — Progress Notes (Signed)
Daily Progress Note   Subjective  - 3 Days Post-Op  Follow-up left foot gangrene. Patient seems somewhat confused this evening. Has apparently been somewhat confused during the day today.  Objective Filed Vitals:   01/12/15 0720 01/12/15 0900 01/12/15 1507 01/12/15 2035  BP: 103/46 103/46 128/95 133/62  Pulse: 100  101 105  Temp: 99.9 F (37.7 C)  98.5 F (36.9 C) 99.2 F (37.3 C)  TempSrc: Oral  Oral Oral  Resp: 18  18 20   Height:      Weight:      SpO2: 98%  94% 90%    Physical Exam: Left lower leg erythema is improved from the ankle proximal. She'll residual diffuse erythema from the ankle distally. Area of necrosis on the medial aspect of the left great toe joint is noted and stable. Urine was noted. He necrosis of the third and fourth toes without distal gangrene. Necrotic skin island in the central mid-arch is noted table. There is no active infection at this time.  Laboratory CBC    Component Value Date/Time   WBC 19.8* 01/12/2015 0411   WBC 8.8 12/25/2013 1136   HGB 10.8* 01/12/2015 0411   HGB 14.2 12/25/2013 1136   HCT 31.9* 01/12/2015 0411   HCT 43.7 12/25/2013 1136   PLT 243 01/12/2015 0411   PLT 210 12/25/2013 1136    BMET    Component Value Date/Time   NA 132* 01/12/2015 0411   NA 140 12/25/2013 1136   K 4.2 01/12/2015 0411   K 3.9 12/25/2013 1136   CL 97* 01/12/2015 0411   CL 107 12/25/2013 1136   CO2 29 01/12/2015 0411   CO2 30 12/25/2013 1136   GLUCOSE 144* 01/12/2015 0411   GLUCOSE 269* 12/25/2013 1136   BUN 42* 01/12/2015 0411   BUN 21* 12/25/2013 1136   CREATININE 3.27* 01/12/2015 0411   CREATININE 0.71 12/25/2013 1136   CALCIUM 6.2* 01/12/2015 0411   CALCIUM 8.4* 12/25/2013 1136   GFRNONAA 18* 01/12/2015 0411   GFRNONAA >60 12/25/2013 1136   GFRAA 21* 01/12/2015 0411   GFRAA >60 12/25/2013 1136    Assessment/Planning: Gangrene with cellulitis left lower leg.   He still having elevated white blood cell counts. He still has  residual erythema of the left foot. At this time I'll order an MRI to rule out a possible abscess in the left foot.  We'll continue with local wound care at this time. If abscess noted will consider surgical I&D.    Gwyneth RevelsFowler, Aliviyah Malanga A  01/12/2015, 8:53 PM

## 2015-01-13 ENCOUNTER — Inpatient Hospital Stay: Payer: Medicare Other

## 2015-01-13 LAB — BASIC METABOLIC PANEL
Anion gap: 5 (ref 5–15)
BUN: 42 mg/dL — AB (ref 6–20)
CHLORIDE: 101 mmol/L (ref 101–111)
CO2: 29 mmol/L (ref 22–32)
Calcium: 6.6 mg/dL — ABNORMAL LOW (ref 8.9–10.3)
Creatinine, Ser: 3.15 mg/dL — ABNORMAL HIGH (ref 0.61–1.24)
GFR calc non Af Amer: 19 mL/min — ABNORMAL LOW (ref >60)
GFR, EST AFRICAN AMERICAN: 22 mL/min — AB (ref >60)
Glucose, Bld: 45 mg/dL — ABNORMAL LOW (ref 65–99)
POTASSIUM: 4.4 mmol/L (ref 3.5–5.1)
SODIUM: 135 mmol/L (ref 135–145)

## 2015-01-13 LAB — GLUCOSE, CAPILLARY
GLUCOSE-CAPILLARY: 51 mg/dL — AB (ref 65–99)
GLUCOSE-CAPILLARY: 126 mg/dL — AB (ref 65–99)
GLUCOSE-CAPILLARY: 46 mg/dL — AB (ref 65–99)
GLUCOSE-CAPILLARY: 297 mg/dL — AB (ref 65–99)
Glucose-Capillary: 335 mg/dL — ABNORMAL HIGH (ref 65–99)
Glucose-Capillary: 374 mg/dL — ABNORMAL HIGH (ref 65–99)

## 2015-01-13 LAB — CBC
HEMATOCRIT: 35.6 % — AB (ref 40.0–52.0)
Hemoglobin: 11.8 g/dL — ABNORMAL LOW (ref 13.0–18.0)
MCH: 31 pg (ref 26.0–34.0)
MCHC: 33.2 g/dL (ref 32.0–36.0)
MCV: 93.5 fL (ref 80.0–100.0)
Platelets: 281 10*3/uL (ref 150–440)
RBC: 3.81 MIL/uL — AB (ref 4.40–5.90)
RDW: 13.7 % (ref 11.5–14.5)
WBC: 24.5 10*3/uL — AB (ref 3.8–10.6)

## 2015-01-13 LAB — CULTURE, BLOOD (ROUTINE X 2)
CULTURE: NO GROWTH
Culture: NO GROWTH

## 2015-01-13 MED ORDER — DEXTROSE 50 % IV SOLN
INTRAVENOUS | Status: AC
  Filled 2015-01-13: qty 50

## 2015-01-13 MED ORDER — PIPERACILLIN-TAZOBACTAM 3.375 G IVPB
3.3750 g | Freq: Three times a day (TID) | INTRAVENOUS | Status: DC
  Administered 2015-01-13 – 2015-01-14 (×5): 3.375 g via INTRAVENOUS
  Filled 2015-01-13 (×8): qty 50

## 2015-01-13 MED ORDER — INSULIN ASPART 100 UNIT/ML ~~LOC~~ SOLN
0.0000 [IU] | Freq: Every day | SUBCUTANEOUS | Status: DC
  Administered 2015-01-13: 5 [IU] via SUBCUTANEOUS
  Filled 2015-01-13: qty 5

## 2015-01-13 MED ORDER — INSULIN GLARGINE 100 UNIT/ML ~~LOC~~ SOLN
40.0000 [IU] | Freq: Every day | SUBCUTANEOUS | Status: DC
  Administered 2015-01-13: 40 [IU] via SUBCUTANEOUS
  Filled 2015-01-13 (×2): qty 0.4

## 2015-01-13 MED ORDER — INSULIN ASPART 100 UNIT/ML ~~LOC~~ SOLN
0.0000 [IU] | Freq: Three times a day (TID) | SUBCUTANEOUS | Status: DC
  Administered 2015-01-13: 11 [IU] via SUBCUTANEOUS
  Administered 2015-01-14: 3 [IU] via SUBCUTANEOUS
  Administered 2015-01-14: 2 [IU] via SUBCUTANEOUS
  Filled 2015-01-13: qty 3
  Filled 2015-01-13: qty 4
  Filled 2015-01-13: qty 7
  Filled 2015-01-13: qty 2

## 2015-01-13 MED ORDER — DEXTROSE 50 % IV SOLN
25.0000 mL | Freq: Once | INTRAVENOUS | Status: AC
  Administered 2015-01-13: 25 mL via INTRAVENOUS

## 2015-01-13 MED ORDER — FUROSEMIDE 10 MG/ML IJ SOLN
20.0000 mg | Freq: Once | INTRAMUSCULAR | Status: AC
  Administered 2015-01-13: 20 mg via INTRAVENOUS
  Filled 2015-01-13: qty 2

## 2015-01-13 NOTE — Progress Notes (Signed)
[facility] - [place] at [facility]   PATIENT NAME: Johnny Lane    MR#:  [id number]  DATE OF BIRTH:  08/10/1950  SUBJECTIVE:  CHIEF COMPLAINT:   Chief Complaint  Patient presents with  . Insect Bite   the patient is AAO 3. Has mild tremor.  REVIEW OF SYSTEMS:  CONSTITUTIONAL: No fever, has generalized weakness.  EYES: No blurred or double vision.  EARS, NOSE, AND THROAT: No tinnitus or ear pain.  RESPIRATORY: No cough, shortness of breath, wheezing or hemoptysis.  CARDIOVASCULAR: No chest pain, orthopnea, edema.  GASTROINTESTINAL: No nausea, vomiting, diarrhea or abdominal pain.  GENITOURINARY: No dysuria, hematuria.  ENDOCRINE: No polyuria, nocturia,  HEMATOLOGY: No anemia, easy bruising or bleeding SKIN: No rash or lesion. MUSCULOSKELETAL:  left foot pain NEUROLOGIC: No tingling, numbness, weakness. Has tremor. PSYCHIATRY: No anxiety or depression.   DRUG ALLERGIES:  No Known Allergies  VITALS:  Blood pressure 115/63, pulse 101, temperature 97.8 F (36.6 C), temperature source Oral, resp. rate 18, height  (1.727 m), weight 81.647 kg (180 lb), SpO2 94 %.  PHYSICAL EXAMINATION:  GENERAL:  64 y.o.-year-old patient lying in the bed with no acute distress.  EYES: Pupils equal, round, reactive to light and accommodation. No scleral icterus. Extraocular muscles intact.  HEENT: Head atraumatic, normocephalic. Oropharynx and nasopharynx clear.  NECK:  Supple, no jugular venous distention. No thyroid enlargement, no tenderness.  LUNGS: Normal breath sounds bilaterally, no wheezing, rales,rhonchi or crepitation. No use of accessory muscles of respiration.  CARDIOVASCULAR: S1, S2 normal. No murmurs, rubs, or gallops.  ABDOMEN: Soft, nontender, nondistended. Bowel sounds present. No organomegaly or mass.  EXTREMITIES: No pedal edema, cyanosis, or clubbing. Better left foot and leg tenderness, warmness and erythema under knee, foot in  dressing. NEUROLOGIC: Cranial nerves II through XII are intact. Muscle strength 4/5 in all extremities. Sensation intact. Gait not checked. No tremor. PSYCHIATRIC: The patient is alert and oriented x 3.  But looks confused. SKIN: No obvious rash, lesion, or ulcer.    LABORATORY PANEL:   CBC  Recent Labs Lab 01/13/15 0304  WBC 24.5*  HGB 11.8*  HCT 35.6*  PLT 281   ------------------------------------------------------------------------------------------------------------------  Chemistries   Recent Labs Lab 01/08/15 0846  01/10/15 1121  01/13/15 0304  NA 128*  < >  --   < > 135  K 5.0  < >  --   < > 4.4  CL 87*  < >  --   < > 101  CO2 30  < >  --   < > 29  GLUCOSE 833*  < >  --   < > 45*  BUN 19  < >  --   < > 42*  CREATININE 0.91  < >  --   < > 3.15*  CALCIUM 7.2*  < >  --   < > 6.6*  MG  --   < > 2.8*  --   --   AST 14*  --   --   --   --   ALT 14*  --   --   --   --   ALKPHOS 127*  --   --   --   --   BILITOT 2.1*  --   --   --   --   < > = values in this interval not displayed. ------------------------------------------------------------------------------------------------------------------  Cardiac Enzymes No results for input(s): TROPONINI in the last 168 hours. ------------------------------------------------------------------------------------------------------------------  RADIOLOGY:  Dg Chest  1 View  01/13/2015  CLINICAL DATA:  Acute onset of shortness of breath. Initial encounter. EXAM: CHEST 1 VIEW COMPARISON:  Chest radiograph performed 01/08/2015 FINDINGS: Right-sided airspace opacification may reflect pneumonia or asymmetric pulmonary edema. A small right pleural effusion is again noted. The left lung appears relatively clear. Mild vascular congestion is noted. The cardiomediastinal silhouette is borderline enlarged. No acute osseous abnormalities are identified. IMPRESSION: Right-sided airspace opacification may reflect pneumonia or asymmetric  pulmonary edema, worsened from the prior study. Small right pleural effusion again noted. Mild vascular congestion and borderline cardiomegaly. Electronically Signed   By: Roanna RaiderJeffery  Chang M.D.   On: 01/13/2015 05:09   [place] Renal  01/12/2015  CLINICAL DATA:  Acute renal failure.  Initial encounter. EXAM: RENAL / URINARY TRACT ULTRASOUND COMPLETE COMPARISON:  Abdominal radiographs 06/21/2013. FINDINGS: Right Kidney: Length: 12.9 cm. The renal cortical thickness is preserved. There is mildly increased cortical echogenicity. There is a probable tiny cyst in the lower pole, measuring 9 mm maximally. No hydronephrosis. Left Kidney: Length: 13.6 cm. Echogenicity within normal limits. No mass or hydronephrosis visualized. Bladder: Appears normal for degree of bladder distention. Incidentally noted is a small right pleural effusion and a small amount of perihepatic ascites. IMPRESSION: 1. Both kidneys are normal in size without hydronephrosis. 2. Mildly increased cortical echogenicity on the right. 3. Small right pleural effusion and mild ascites noted. Electronically Signed   By: Carey BullocksWilliam  Veazey M.D.   On: 01/12/2015 14:16    EKG:   Orders placed or performed during the hospital encounter of 01/08/15  . EKG 12-Lead  . EKG 12-Lead    ASSESSMENT AND PLAN:   1. Cellulitis and Gangrene left foot with known PAD:  Wound culture showed MODERATE GRAM POSITIVE COCCI IN PAIRS IN CHAINS. continue Zosyn and discontinued vancomycin. Blood cultures are negative so far. X-ray does not show evidence of osteomyelitis.  Pain control.  Fj/u MRI of left foot. Per Dr. Ether GriffinsFowler, would not recommend surgical debridment of wounds and prefer to begin with enzymatic debridment and local wound care.  s/p LE angiogram with revascularization.  Per Dr. Wyn Quakerew,  Blood flow optimized on the left leg. Still with disease on the right, and if ulcer does not heal in near future would recommend RLE angiogram as well. Follow up in office in 3  weeks or so with ABIs   * Leukocytosis. WBC is still high, Follow-up CBC.  2. DM2: Controlled. But had one episode of hypoglycemia again this am but up to 297 at lunch time. He was treated with insulin drip, but off insulin drip.  Decrease lantus 40 units HS and  moderate-dose sliding scale insulin. discontinued glipizide and metformin.  3. Essential hypertension: controlled, discontinued lisinopril due to low blood pressure and acute renal failure. He reports he does not take metoprolol as prescribed to him due to side effects.  4. CAD: resumed aspirin and Plavix after procedure. Continue atorvastatin.  5. Ischemic cardiomyopathy EF 35%: Patient states he does not take Lasix due to the side effect of urinating frequently. Watch his fluid status. discontinued lisinopril.  6. Tobacco dependence: Patient states he is trying to quit smoking. Quit back in 2015 he sometimes slips up. Patient is encouraged to continue to stop smoking. Patient counseled 3 minutes.  7. EtOH withdrawal. Continue CIWA protocol.     * ARF.  possible due to contrast, improving, hold lisinopril and lasix. Discontinued vancomycin. Discontinue NS, ollow up BMP. * Hyponatremia. Hold lasix. Improved with NS iv. * Hypokalemia.  improved with K supplement..  * Renal artery stenosis Incidental finding of occluded right renal artery during angiogram.   PT evaluation suggested skilled nursing facility placement.  I discussed with Dr. Thedore Mins. All the records are reviewed and case discussed with Care Management/Social Workerr. Management plans discussed with the patient, his wife  and they are in agreement.  CODE STATUS: DO NOT RESUSCITATE  TOTAL TIME TAKING CARE OF THIS PATIENT: 37 minutes.  Greater than 50% time was spent on coordination of care and face-to-face counseling.  POSSIBLE D/C TO SKILLED NURSING FACILITY IN 3 DAYS, DEPENDING ON CLINICAL CONDITION.   Shaune Pollack M.D on 01/13/2015 at 2:17 PM  Between 7am  to 6pm - Pager - [phone]  After 6pm go to [email or web address] - password [email or web address] [facility]  [place] [facility]  Office  [phone]  CC: Primary care physician; Leotis Shames, MD

## 2015-01-13 NOTE — Clinical Social Work Note (Signed)
Per RN CM discussion with MD, patient is not yet medically ready for discharge. York SpanielMonica Myangel Summons MSW,LCSW [phone]

## 2015-01-13 NOTE — Progress Notes (Signed)
Johnny HuaDavid found with foam coming from mouth. Aroused to voice, alert, disoriented to time, place and situation, as has been this shift. Lung sounds are wet sounding. Nursing oral suction Johnny Huaavid and had him to cough. Dr Anne HahnWillis, MD on-call notified. O2 88% 2L. STAT chest x-ray and IV lasix ordered. Nursing continues to monitor.

## 2015-01-13 NOTE — Progress Notes (Signed)
ANTIBIOTIC CONSULT NOTE - INITIAL  Pharmacy Consult for Zosyn Indication: possible aspiration pneumonia  No Known Allergies  Patient Measurements: Height: 5\' 8"  (172.7 cm) Weight: 180 lb (81.647 kg) IBW/kg (Calculated) : 68.4 Adjusted Body Weight:   Vital Signs: Temp: 97.4 F (36.3 C) (12/26 0436) Temp Source: Oral (12/26 0436) BP: 110/56 mmHg (12/26 0436) Pulse Rate: 85 (12/26 0436) Intake/Output from previous day: 12/25 0701 - 12/26 0700 In: 1700.8 [P.O.:240; I.V.:1410.8; IV Piggyback:50] Out: 775 [Urine:775] Intake/Output from this shift: Total I/O In: 1460.8 [I.V.:1410.8; IV Piggyback:50] Out: 300 [Urine:300]  Labs:  Recent Labs  01/11/15 0339 01/12/15 0411 01/12/15 1745 01/13/15 0304  WBC 25.2* 19.8*  --  24.5*  HGB 13.1 10.8*  --  11.8*  PLT 281 243  --  281  LABCREA  --   --  58  --   CREATININE 2.90* 3.27*  --  3.15*   Estimated Creatinine Clearance: 22.3 mL/min (by C-G formula based on Cr of 3.15).  Recent Labs  01/10/15 1121  VANCORANDOM 17     Microbiology: Recent Results (from the past 720 hour(s))  Blood culture (routine x 2)     Status: None (Preliminary result)   Collection Time: 01/08/15  8:46 AM  Result Value Ref Range Status   Specimen Description BLOOD LEFT ASSIST CONTROL  Final   Special Requests   Final    BOTTLES DRAWN AEROBIC AND ANAEROBIC  5CC AERO 2CC ANAERO   Culture NO GROWTH 4 DAYS  Final   Report Status PENDING  Incomplete  Blood culture (routine x 2)     Status: None (Preliminary result)   Collection Time: 01/08/15  9:29 AM  Result Value Ref Range Status   Specimen Description BLOOD RIGHT ARM  Final   Special Requests BOTTLES DRAWN AEROBIC AND ANAEROBIC  1CC  Final   Culture NO GROWTH 4 DAYS  Final   Report Status PENDING  Incomplete  MRSA PCR Screening     Status: None   Collection Time: 01/08/15 11:53 AM  Result Value Ref Range Status   MRSA by PCR NEGATIVE NEGATIVE Final    Comment:        The GeneXpert MRSA  Assay (FDA approved for NASAL specimens only), is one component of a comprehensive MRSA colonization surveillance program. It is not intended to diagnose MRSA infection nor to guide or monitor treatment for MRSA infections.   Wound culture     Status: None   Collection Time: 01/08/15  4:18 PM  Result Value Ref Range Status   Specimen Description WOUND  Final   Special Requests Immunocompromised  Final   Gram Stain   Final    FEW WBC SEEN MODERATE GRAM POSITIVE COCCI IN PAIRS IN CHAINS    Culture   Final    MODERATE GROWTH STREPTOCOCCUS GROUP G There is no known Penicillin Resistant Beta Streptococcus in the [place] For patients that are Penicillin-allergic, Erythromycin is 85-94% susceptible, and Clindamycin is 80% susceptible.  Contact Microbiology within 7 days if sensitivity testing is  required.      Report Status 01/12/2015 FINAL  Final    Medical History: Past Medical History  Diagnosis Date  . Diabetes mellitus with neuropathy (HCC)   . Benign essential HTN   . PVD (peripheral vascular disease) (HCC)     Medications:  Infusions:   Assessment: 64 yom post op day 4 revascularization with rising WBC, pharmacy consulted to dose Zosyn for possible aspiration pneumonia. Of note, patient was on Zosyn for  cellulitis and therapy was narrowed yesterday to ampicillin. Discussed with Dr. Anne Hahn, okay to discontinue ampicillin and go back to Zosyn.   Goal of Therapy:    Plan:  Expected duration 7 days with resolution of temperature and/or normalization of WBC. Zosyn 3.375 gm IV Q8H EI. Pharmacy will continue to follow.  Carola Frost, Pharm.D., BCPS Clinical Pharmacist 01/13/2015,5:50 AM

## 2015-01-13 NOTE — Care Management Important Message (Signed)
Important Message  Patient Details  Name: Johnny RastDavid Lane MRN: [id number] Date of Birth: 02/18/1950   Medicare Important Message Given:  Yes    Collie SiadAngela Vara Mairena, RN 01/13/2015, 7:38 AM

## 2015-01-13 NOTE — Care Management Note (Addendum)
Case Management Note  Patient Details  Name: Johnny RastDavid Lane MRN: [id number] Date of Birth: 09/01/1950  Subjective/Objective:     CSW following for SNF placement at [facility] when medically stable. Case discussed with Dr. Imogene Burnhen.  WBC 24.5. Kidney function remains poor. It is anticipated patient will be here approximately 3 more days               Action/Plan:   Expected Discharge Date:                  Expected Discharge Plan:  Skilled Nursing Facility  In-House Referral:  Clinical Social Work  Discharge planning Services  CM Consult  Post Acute Care Choice:    Choice offered to:  Patient  DME Arranged:    DME Agency:     HH Arranged:    HH Agency:     Status of Service:  In process, will continue to follow  Medicare Important Message Given:  Yes Date Medicare IM Given:    Medicare IM give by:    Date Additional Medicare IM Given:    Additional Medicare Important Message give by:     If discussed at Long Length of Stay Meetings, dates discussed:    Additional Comments:  Marily MemosLisa M Zasha Belleau, RN 01/13/2015, 10:16 AM

## 2015-01-13 NOTE — Progress Notes (Signed)
Inpatient Diabetes Program Recommendations  AACE/ADA: New Consensus Statement on Inpatient Glycemic Control (2015)  Target Ranges:  Prepandial:   less than 140 mg/dL      [facility] postprandial:   less than 180 mg/dL (1-2 hours)      Critically ill patients:  140 - 180 mg/dL  Results for Johnny Lane, Kelii (MRN [id number]) as of 01/13/2015 11:04  Ref. Range 01/12/2015 07:19 01/12/2015 11:04 01/12/2015 13:07 01/12/2015 13:09 01/12/2015 13:26 01/12/2015 14:24 01/12/2015 15:40 01/12/2015 22:07 01/13/2015 07:25 01/13/2015 07:38 01/13/2015 07:54  Glucose-Capillary Latest Ref Range: 65-99 mg/dL 952116 (H) 54 (L) 42 (LL) 55 (L) 57 (L) 79 96 93 51 (L) 46 (L) 126 (H)   Review of Glycemic Control  Current orders for Inpatient glycemic control: Lantus 50 units QHS, Novolog 0-9 units TID with meals, Novolog 0-5 units HS  Inpatient Diabetes Program Recommendations: Insulin - Basal: Patient received Lantus 50 units last night and fasting glucose 46 mg/dl this morning. Please consider decreasing Lantus to 40 units QHS.  Thanks, Orlando PennerMarie Verbon Giangregorio, RN, MSN, CDE Diabetes Coordinator Inpatient Diabetes Program [phone] (Team Pager from 8am to 5pm) [phone] ([facility] office) [phone] [facility] office) [phone] [facility] office)

## 2015-01-13 NOTE — Progress Notes (Signed)
Subjective:  Patient is awake and alert and able to have a conversation today He states he feels fine today No complaints of shortness of breath Today's labs show slight improvement in serum creatinine to 3.15 down from 3.27   Objective:  Vital signs in last 24 hours:  Temp:  [97.4 F (36.3 C)-99.2 F (37.3 C)] 97.8 F (36.6 C) (12/26 0724) Pulse Rate:  [82-105] 101 (12/26 1150) Resp:  [16-20] 18 (12/26 0724) BP: (110-133)/(56-95) 115/63 mmHg (12/26 1150) SpO2:  [90 %-96 %] 94 % (12/26 0724)  Weight change:  Filed Weights   01/08/15 0843  Weight: 81.647 kg (180 lb)    Intake/Output:    Intake/Output Summary (Last 24 hours) at 01/13/15 1246 Last data filed at 01/13/15 9528  Gross per 24 hour  Intake 1700.83 ml  Output   1675 ml  Net  25.83 ml     Physical Exam: General:  laying in the bed, no acute distress   HEENT  anicteric, moist mucous membranes   Neck  supple, no masses   Pulm/lungs  normal effort, clear to auscultation bilaterally   CVS/Heart  no rub or gallop   Abdomen:   soft, nontender   Extremities:  left leg cellulitis and toe gangrene   Neurologic:  alert, oriented today   Skin:  left leg cellulitis   Access:        Basic Metabolic Panel:   Recent Labs Lab 01/09/15 0444 01/10/15 0203 01/10/15 1121 01/11/15 0339 01/12/15 0411 01/13/15 0304  NA 129* 129*  --  135 132* 135  K 3.2* 4.0  --  4.0 4.2 4.4  CL 93* 93*  --  94* 97* 101  CO2 32 31  --  33* 29 29  GLUCOSE 173* 238*  --  132* 144* 45*  BUN 20 30*  --  34* 42* 42*  CREATININE 1.62* 2.37*  --  2.90* 3.27* 3.15*  CALCIUM 6.5* 6.5*  --  6.9* 6.2* 6.6*  MG 2.8*  --  2.8*  --   --   --      CBC:  Recent Labs Lab 01/08/15 0846 01/09/15 0444 01/10/15 0203 01/11/15 0339 01/12/15 0411 01/13/15 0304  WBC 22.5* 21.9* 23.6* 25.2* 19.8* 24.5*  NEUTROABS 20.7*  --   --   --   --   --   HGB 13.5 12.1* 12.2* 13.1 10.8* 11.8*  HCT 42.9 36.9* 36.8* 39.9* 31.9* 35.6*  MCV 95.1 93.4  92.4 94.5 92.0 93.5  PLT 296 265 255 281 243 281      Microbiology:  Recent Results (from the past 720 hour(s))  Blood culture (routine x 2)     Status: None (Preliminary result)   Collection Time: 01/08/15  8:46 AM  Result Value Ref Range Status   Specimen Description BLOOD LEFT ASSIST CONTROL  Final   Special Requests   Final    BOTTLES DRAWN AEROBIC AND ANAEROBIC  5CC AERO 2CC ANAERO   Culture NO GROWTH 4 DAYS  Final   Report Status PENDING  Incomplete  Blood culture (routine x 2)     Status: None (Preliminary result)   Collection Time: 01/08/15  9:29 AM  Result Value Ref Range Status   Specimen Description BLOOD RIGHT ARM  Final   Special Requests BOTTLES DRAWN AEROBIC AND ANAEROBIC  1CC  Final   Culture NO GROWTH 4 DAYS  Final   Report Status PENDING  Incomplete  MRSA PCR Screening     Status: None  Collection Time: 01/08/15 11:53 AM  Result Value Ref Range Status   MRSA by PCR NEGATIVE NEGATIVE Final    Comment:        The GeneXpert MRSA Assay (FDA approved for NASAL specimens only), is one component of a comprehensive MRSA colonization surveillance program. It is not intended to diagnose MRSA infection nor to guide or monitor treatment for MRSA infections.   Wound culture     Status: None   Collection Time: 01/08/15  4:18 PM  Result Value Ref Range Status   Specimen Description WOUND  Final   Special Requests Immunocompromised  Final   Gram Stain   Final    FEW WBC SEEN MODERATE GRAM POSITIVE COCCI IN PAIRS IN CHAINS    Culture   Final    MODERATE GROWTH STREPTOCOCCUS GROUP G There is no known Penicillin Resistant Beta Streptococcus in the [place] For patients that are Penicillin-allergic, Erythromycin is 85-94% susceptible, and Clindamycin is 80% susceptible.  Contact Microbiology within 7 days if sensitivity testing is  required.      Report Status 01/12/2015 FINAL  Final    Coagulation Studies: No results for input(s): LABPROT, INR in the last 72  hours.  Urinalysis: No results for input(s): COLORURINE, LABSPEC, PHURINE, GLUCOSEU, HGBUR, BILIRUBINUR, KETONESUR, PROTEINUR, UROBILINOGEN, NITRITE, LEUKOCYTESUR in the last 72 hours.  Invalid input(s): APPERANCEUR    Imaging: Dg Chest 1 View  01/13/2015  CLINICAL DATA:  Acute onset of shortness of breath. Initial encounter. EXAM: CHEST 1 VIEW COMPARISON:  Chest radiograph performed 01/08/2015 FINDINGS: Right-sided airspace opacification may reflect pneumonia or asymmetric pulmonary edema. A small right pleural effusion is again noted. The left lung appears relatively clear. Mild vascular congestion is noted. The cardiomediastinal silhouette is borderline enlarged. No acute osseous abnormalities are identified. IMPRESSION: Right-sided airspace opacification may reflect pneumonia or asymmetric pulmonary edema, worsened from the prior study. Small right pleural effusion again noted. Mild vascular congestion and borderline cardiomegaly. Electronically Signed   By: Roanna Raider M.D.   On: 01/13/2015 05:09   US Renal  01/12/2015  CLINICAL DATA:  Acute renal failure.  Initial encounter. EXAM: RENAL / URINARY TRACT ULTRASOUND COMPLETE COMPARISON:  Abdominal radiographs 06/21/2013. FINDINGS: Right Kidney: Length: 12.9 cm. The renal cortical thickness is preserved. There is mildly increased cortical echogenicity. There is a probable tiny cyst in the lower pole, measuring 9 mm maximally. No hydronephrosis. Left Kidney: Length: 13.6 cm. Echogenicity within normal limits. No mass or hydronephrosis visualized. Bladder: Appears normal for degree of bladder distention. Incidentally noted is a small right pleural effusion and a small amount of perihepatic ascites. IMPRESSION: 1. Both kidneys are normal in size without hydronephrosis. 2. Mildly increased cortical echogenicity on the right. 3. Small right pleural effusion and mild ascites noted. Electronically Signed   By: Carey Bullocks M.D.   On: 01/12/2015 14:16      Medications:     . antiseptic oral rinse  7 mL Mouth Rinse BID  . aspirin  81 mg Oral Daily  . atorvastatin  80 mg Oral QHS  . clopidogrel  75 mg Oral Daily  . collagenase   Topical Daily  . folic acid  1 mg Oral Daily  . insulin aspart  0-5 Units Subcutaneous QHS  . insulin aspart  0-9 Units Subcutaneous TID WC  . insulin glargine  50 Units Subcutaneous QHS  . multivitamin with minerals  1 tablet Oral Daily  . piperacillin-tazobactam (ZOSYN)  IV  3.375 g Intravenous 3 times per day  .  senna-docusate  1 tablet Oral Daily  . thiamine  100 mg Oral Daily   acetaminophen **OR** acetaminophen, alum & mag hydroxide-simeth, bisacodyl, LORazepam **OR** LORazepam, menthol-cetylpyridinium, morphine injection, ondansetron **OR** ondansetron (ZOFRAN) IV, oxyCODONE-acetaminophen, senna-docusate  Assessment/ Plan:  64 y.o. male with a PMHX of peripheral vascular disease,poorly controlled diabetes with neuropathy, hypertension, ischemic cardiomyopathy with EF of 35%, peripheral artery disease with bilateral lower extremity ulceration , was admitted on 01/08/2015 with worsening left foot redness and swelling, subjective fevers, necrosis of the left third and fourth toe.   1. Acute renal failure Likely multifactorial including sepsis, concurrent infection, IV contrast exposure, possible contribution from vancomycin  Baseline creatinine appears to be normal at 0.91 at the time of admission on December 21 Since then, creatinine has progressively worsened.  - Today's creatinine is slightly improved to 3.15 - Electrolytes and volume status are acceptable No acute indication for dialysis at present - Does not appear to be prerenal, therefore would decrease the fluid to minimal maintenance required - renal ultrasound reviewed - urine protein/creatinine ratio is 1.28 - Follow electrolytes closely on a daily basis  2. Renal artery stenosis Incidental finding of occluded right renal artery during  angiogram  3. Diabetes, poorly controlled Most recent hemoglobin A1c 13.8%   LOS: 5 Jakyah Bradby 12/26/201612:46 PM

## 2015-01-14 ENCOUNTER — Ambulatory Visit (HOSPITAL_COMMUNITY)
Admission: AD | Admit: 2015-01-14 | Discharge: 2015-01-14 | Disposition: A | Discharge status: Home / Self Care | Payer: Medicare Other | Source: Other Acute Inpatient Hospital | Attending: Internal Medicine | Admitting: Internal Medicine

## 2015-01-14 ENCOUNTER — Encounter: Payer: Self-pay | Admitting: Vascular Surgery

## 2015-01-14 ENCOUNTER — Inpatient Hospital Stay: Payer: Medicare Other

## 2015-01-14 DIAGNOSIS — J189 Pneumonia, unspecified organism: Secondary | ICD-10-CM | POA: Insufficient documentation

## 2015-01-14 LAB — BASIC METABOLIC PANEL
ANION GAP: 8 (ref 5–15)
BUN: 41 mg/dL — AB (ref 6–20)
CO2: 30 mmol/L (ref 22–32)
CREATININE: 2.91 mg/dL — AB (ref 0.61–1.24)
Calcium: 7.1 mg/dL — ABNORMAL LOW (ref 8.9–10.3)
Chloride: 97 mmol/L — ABNORMAL LOW (ref 101–111)
GFR calc Af Amer: 24 mL/min — ABNORMAL LOW (ref >60)
GFR, EST NON AFRICAN AMERICAN: 21 mL/min — AB (ref >60)
GLUCOSE: 218 mg/dL — AB (ref 65–99)
Potassium: 5.2 mmol/L — ABNORMAL HIGH (ref 3.5–5.1)
Sodium: 135 mmol/L (ref 135–145)

## 2015-01-14 LAB — GLUCOSE, CAPILLARY
GLUCOSE-CAPILLARY: 164 mg/dL — AB (ref 65–99)
GLUCOSE-CAPILLARY: 148 mg/dL — AB (ref 65–99)
GLUCOSE-CAPILLARY: 142 mg/dL — AB (ref 65–99)

## 2015-01-14 LAB — CBC
HCT: 33.3 % — ABNORMAL LOW (ref 40.0–52.0)
Hemoglobin: 11 g/dL — ABNORMAL LOW (ref 13.0–18.0)
MCH: 30.9 pg (ref 26.0–34.0)
MCHC: 33 g/dL (ref 32.0–36.0)
MCV: 93.8 fL (ref 80.0–100.0)
PLATELETS: 283 10*3/uL (ref 150–440)
RBC: 3.55 MIL/uL — ABNORMAL LOW (ref 4.40–5.90)
RDW: 13.8 % (ref 11.5–14.5)
WBC: 20.8 10*3/uL — AB (ref 3.8–10.6)

## 2015-01-14 MED ORDER — INSULIN GLARGINE 100 UNIT/ML ~~LOC~~ SOLN
45.0000 [IU] | Freq: Every day | SUBCUTANEOUS | Status: DC
  Filled 2015-01-14: qty 0.45

## 2015-01-14 MED ORDER — PIPERACILLIN-TAZOBACTAM 3.375 G IVPB: 3.3750 g | Freq: Three times a day (TID) | INTRAVENOUS | Status: AC

## 2015-01-14 MED ORDER — SODIUM POLYSTYRENE SULFONATE 15 GM/60ML PO SUSP
30.0000 g | Freq: Once | ORAL | Status: AC
  Administered 2015-01-14: 30 g via ORAL
  Filled 2015-01-14: qty 120

## 2015-01-14 MED ORDER — COLLAGENASE 250 UNIT/GM EX OINT: TOPICAL_OINTMENT | Freq: Every day | CUTANEOUS | Status: AC

## 2015-01-14 NOTE — Clinical Social Work Note (Signed)
Patient no longer to go to [facility] at discharge and RN CM has arranged for LTAC for today. York SpanielMonica Shacoria Latif MSW,LCSW [phone]

## 2015-01-14 NOTE — Progress Notes (Signed)
Initial Nutrition Assessment   INTERVENTION:   Meals and Snacks: Cater to patient preferences to optimize nutritional intake on Carb Modified diet order   NUTRITION DIAGNOSIS:   No nutrition diagnosis at this time  GOAL:   Patient will meet greater than or equal to 90% of their needs  MONITOR:    (Energy Intake, Skin, Glucose Profile)  REASON FOR ASSESSMENT:   LOS    ASSESSMENT:   Pt admitted wtih spider bite and cellulitis with gangrene of left foot. Pt now with discharge orders placed.  Past Medical History  Diagnosis Date  . Diabetes mellitus with neuropathy (HCC)   . Benign essential HTN   . PVD (peripheral vascular disease) (HCC)      Diet Order:  Diet Carb Modified Fluid consistency:: Thin; Room service appropriate?: Yes Diet - low sodium heart healthy    Food/Nutrition-Related History: Recorded po intake since admission 61% of meals on average including 100% of last 3 meals recorded. RN [name] reports pt with a good appetite.    Scheduled Medications:  . antiseptic oral rinse  7 mL Mouth Rinse BID  . aspirin  81 mg Oral Daily  . atorvastatin  80 mg Oral QHS  . clopidogrel  75 mg Oral Daily  . collagenase   Topical Daily  . folic acid  1 mg Oral Daily  . insulin aspart  0-15 Units Subcutaneous TID WC  . insulin aspart  0-5 Units Subcutaneous QHS  . insulin glargine  45 Units Subcutaneous QHS  . multivitamin with minerals  1 tablet Oral Daily  . piperacillin-tazobactam (ZOSYN)  IV  3.375 g Intravenous 3 times per day  . senna-docusate  1 tablet Oral Daily  . thiamine  100 mg Oral Daily     Electrolyte/Renal Profile and Glucose Profile:   Recent Labs Lab 01/09/15 0444  01/10/15 1121  01/12/15 0411 01/13/15 0304 01/14/15 0426  NA 129*  < >  --   < > 132* 135 135  K 3.2*  < >  --   < > 4.2 4.4 5.2*  CL 93*  < >  --   < > 97* 101 97*  CO2 32  < >  --   < > 29 29 30   BUN 20  < >  --   < > 42* 42* 41*  CREATININE 1.62*  < >  --   < > 3.27*  3.15* 2.91*  CALCIUM 6.5*  < >  --   < > 6.2* 6.6* 7.1*  MG 2.8*  --  2.8*  --   --   --   --   GLUCOSE 173*  < >  --   < > 144* 45* 218*  < > = values in this interval not displayed. Protein Profile:  Recent Labs Lab 01/08/15 0846  ALBUMIN 2.2*    Gastrointestinal Profile: Last BM:  01/11/2015   Weight Change: Per MST no decrease in weight PTA   Height:   Ht Readings from Last 1 Encounters:  01/08/15 5\' 8"  (1.727 m)    Weight:   Wt Readings from Last 1 Encounters:  01/08/15 180 lb (81.647 kg)     BMI:  Body mass index is 27.38 kg/(m^2).   EDUCATION NEEDS:   No education needs identified at this time   LOW Care Level  Leda QuailAllyson Altair Stanko, RD, LDN Pager [phone] Weekend/On-Call Pager [phone]

## 2015-01-14 NOTE — Progress Notes (Signed)
PT Cancellation Note  Patient Details Name: Johnny Lane MRN: [id number] DOB: 04/24/1950   Cancelled Treatment:    Reason Eval/Treat Not Completed: Patient declined, no reason specified. PT reviewed chart and entered the room. Patient refused to get up at this time stating "I just became comfortable, I would not cooperate, don't make me do it". PT will continue to attempt at a later date/time.   Kerin RansomPatrick A Luc Shammas, PT, DPT    01/14/2015, 9:36 AM

## 2015-01-14 NOTE — Progress Notes (Signed)
[facility] [place] [facility]   PATIENT NAME: Johnny Lane    MR#:  [id number]  DATE OF BIRTH:  [birth year]  SUBJECTIVE:  CHIEF COMPLAINT:   Chief Complaint  Patient presents with  . Insect Bite   the patient is AAO 3. No tremor but weakness. REVIEW OF SYSTEMS:  CONSTITUTIONAL: No fever, has generalized weakness.  EYES: No blurred or double vision.  EARS, NOSE, AND THROAT: No tinnitus or ear pain.  RESPIRATORY: No cough, shortness of breath, wheezing or hemoptysis.  CARDIOVASCULAR: No chest pain, orthopnea, edema.  GASTROINTESTINAL: No nausea, vomiting, diarrhea or abdominal pain.  GENITOURINARY: No dysuria, hematuria.  ENDOCRINE: No polyuria, nocturia,  HEMATOLOGY: No anemia, easy bruising or bleeding SKIN: No rash or lesion. MUSCULOSKELETAL:  left foot pain NEUROLOGIC: No tingling, numbness, weakness. No tremor. PSYCHIATRY: No anxiety or depression.   DRUG ALLERGIES:  No Known Allergies  VITALS:  Blood pressure 127/55, pulse 88, temperature 100.3 F (37.9 C), temperature source Oral, resp. rate 17, height  (1.727 m), weight 81.647 kg (180 lb), SpO2 93 %.  PHYSICAL EXAMINATION:  GENERAL:  64 y.o.-year-old patient lying in the bed with lethargy.  EYES: Pupils equal, round, reactive to light and accommodation. No scleral icterus. Extraocular muscles intact.  HEENT: Head atraumatic, normocephalic. Oropharynx and nasopharynx clear.  NECK:  Supple, no jugular venous distention. No thyroid enlargement, no tenderness.  LUNGS: Normal breath sounds bilaterally, no wheezing, rales,rhonchi or crepitation. No use of accessory muscles of respiration.  CARDIOVASCULAR: S1, S2 normal. No murmurs, rubs, or gallops.  ABDOMEN: Soft, nontender, nondistended. Bowel sounds present. No organomegaly or mass.  EXTREMITIES: No pedal edema, cyanosis, or clubbing. Better left foot and leg tenderness, warmness and erythema under knee, foot in dressing. NEUROLOGIC:  Cranial nerves II through XII are intact. Muscle strength 4/5 in all extremities. Sensation intact. Gait not checked. No tremor. PSYCHIATRIC: The patient is alert and oriented x 3. Drowsy. SKIN: No obvious rash, lesion, or ulcer.    LABORATORY PANEL:   CBC  Recent Labs Lab 01/14/15 0426  WBC 20.8*  HGB 11.0*  HCT 33.3*  PLT 283   ------------------------------------------------------------------------------------------------------------------  Chemistries   Recent Labs Lab 01/08/15 0846  01/10/15 1121  01/14/15 0426  NA 128*  < >  --   < > 135  K 5.0  < >  --   < > 5.2*  CL 87*  < >  --   < > 97*  CO2 30  < >  --   < > 30  GLUCOSE 833*  < >  --   < > 218*  BUN 19  < >  --   < > 41*  CREATININE 0.91  < >  --   < > 2.91*  CALCIUM 7.2*  < >  --   < > 7.1*  MG  --   < > 2.8*  --   --   AST 14*  --   --   --   --   ALT 14*  --   --   --   --   ALKPHOS 127*  --   --   --   --   BILITOT 2.1*  --   --   --   --   < > = values in this interval not displayed. ------------------------------------------------------------------------------------------------------------------  Cardiac Enzymes No results for input(s): TROPONINI in the last 168 hours. ------------------------------------------------------------------------------------------------------------------  RADIOLOGY:  Dg Chest 1 View  01/13/2015  CLINICAL DATA:  Acute onset of shortness of breath. Initial encounter. EXAM: CHEST 1 VIEW COMPARISON:  Chest radiograph performed 01/08/2015 FINDINGS: Right-sided airspace opacification may reflect pneumonia or asymmetric pulmonary edema. A small right pleural effusion is again noted. The left lung appears relatively clear. Mild vascular congestion is noted. The cardiomediastinal silhouette is borderline enlarged. No acute osseous abnormalities are identified. IMPRESSION: Right-sided airspace opacification may reflect pneumonia or asymmetric pulmonary edema, worsened from the prior  study. Small right pleural effusion again noted. Mild vascular congestion and borderline cardiomegaly. Electronically Signed   By: Roanna RaiderJeffery  Chang M.D.   On: 01/13/2015 05:09   [place] Renal  01/12/2015  CLINICAL DATA:  Acute renal failure.  Initial encounter. EXAM: RENAL / URINARY TRACT ULTRASOUND COMPLETE COMPARISON:  Abdominal radiographs 06/21/2013. FINDINGS: Right Kidney: Length: 12.9 cm. The renal cortical thickness is preserved. There is mildly increased cortical echogenicity. There is a probable tiny cyst in the lower pole, measuring 9 mm maximally. No hydronephrosis. Left Kidney: Length: 13.6 cm. Echogenicity within normal limits. No mass or hydronephrosis visualized. Bladder: Appears normal for degree of bladder distention. Incidentally noted is a small right pleural effusion and a small amount of perihepatic ascites. IMPRESSION: 1. Both kidneys are normal in size without hydronephrosis. 2. Mildly increased cortical echogenicity on the right. 3. Small right pleural effusion and mild ascites noted. Electronically Signed   By: Carey BullocksWilliam  Veazey M.D.   On: 01/12/2015 14:16    EKG:   Orders placed or performed during the hospital encounter of 01/08/15  . EKG 12-Lead  . EKG 12-Lead    ASSESSMENT AND PLAN:   1. Cellulitis and Gangrene left foot with known PAD:  Wound culture showed MODERATE GRAM POSITIVE COCCI IN PAIRS IN CHAINS. continue Zosyn and discontinued vancomycin. Blood cultures are negative so far. X-ray does not show evidence of osteomyelitis.  Pain control.  Fj/u MRI of left foot. Per Dr. Ether GriffinsFowler, would not recommend surgical debridment of wounds and prefer to begin with enzymatic debridment and local wound care.  s/p LE angiogram with revascularization.  Per Dr. Wyn Quakerew,  Blood flow optimized on the left leg. Still with disease on the right, and if ulcer does not heal in near future would recommend RLE angiogram as well. Follow up in office in 3 weeks or so with ABIs   * Leukocytosis.  WBC is still high, Follow-up CBC.  2. DM2: Controlled. But had one episode of hypoglycemia again this am but up to 297 at lunch time. He was treated with insulin drip, but off insulin drip.  Increase lantus 45 units HS and  moderate-dose sliding scale insulin. discontinued glipizide and metformin.  3. Essential hypertension: controlled, discontinued lisinopril due to low blood pressure and acute renal failure. He reports he does not take metoprolol as prescribed to him due to side effects.  4. CAD: resumed aspirin and Plavix after procedure. Continue atorvastatin.  5. Ischemic cardiomyopathy EF 35%: Patient states he does not take Lasix due to the side effect of urinating frequently. Watch his fluid status. discontinued lisinopril.  6. Tobacco dependence: Patient states he is trying to quit smoking. Quit back in 2015 he sometimes slips up. Patient is encouraged to continue to stop smoking. Patient counseled 3 minutes.  7. EtOH withdrawal. Continue CIWA protocol.     * ARF.  possible due to contrast, improving, hold lisinopril and lasix. Discontinued vancomycin. Discontinued NS, follow up BMP.  * Hyponatremia. Hold lasix. Improved with NS iv. * Hypokalemia.  improved with K  supplement..  * Hyperkalemia today, K5.2, given kayexalate, f/u K.  * Renal artery stenosis Incidental finding of occluded right renal artery during angiogram.   PT evaluation suggested skilled nursing facility placement.  I discussed with Dr. Thedore Mins. All the records are reviewed and case discussed with Care Management/Social Workerr. Management plans discussed with the patient, his wife  and they are in agreement.  CODE STATUS: DO NOT RESUSCITATE  TOTAL TIME TAKING CARE OF THIS PATIENT: 37 minutes.  Greater than 50% time was spent on coordination of care and face-to-face counseling.  POSSIBLE D/C TO SKILLED NURSING FACILITY IN 3 DAYS, DEPENDING ON CLINICAL CONDITION.   Shaune Pollack M.D on 01/14/2015 at 11:37  AM  Between 7am to 6pm - Pager - [phone]  After 6pm go to [email or web address] - password [email or web address] [facility]  [facility]  Office  [phone]  CC: Primary care physician; Leotis Shames, MD

## 2015-01-14 NOTE — Discharge Instructions (Signed)
Heart healthy and ADA diet. °Activity as tolerated. °

## 2015-01-14 NOTE — Discharge Summary (Signed)
[facility] [place] [facility]   PATIENT NAME: Johnny RastDavid Lane    MR#:  [id number]  DATE OF BIRTH:  11/30/1950  DATE OF ADMISSION:  01/08/2015 ADMITTING PHYSICIAN: Adrian SaranSital Mody, MD  DATE OF DISCHARGE: 01/14/2015 PRIMARY CARE PHYSICIAN: [name],[name], MD    ADMISSION DIAGNOSIS:  Hyperglycemia [R73.9] Hypoxia [R09.02] Cellulitis of left lower extremity [L03.116]   DISCHARGE DIAGNOSIS:  Cellulitis and Gangrene left foot with known PAD with Leukocytosis ARF  SECONDARY DIAGNOSIS:   Past Medical History  Diagnosis Date  . Diabetes mellitus with neuropathy (HCC)   . Benign essential HTN   . PVD (peripheral vascular disease) (HCC)     HOSPITAL COURSE:   1. Cellulitis and Gangrene left foot with known PAD:  Wound culture showed MODERATE GRAM POSITIVE COCCI IN PAIRS IN CHAINS. continue Zosyn and discontinued vancomycin. Blood cultures are negative so far. X-ray does not show evidence of osteomyelitis. Pain control. Fj/u MRI of left foot. Per Dr. Ether GriffinsFowler, would not recommend surgical debridment of wounds and prefer to begin with enzymatic debridment and local wound care. s/p LE angiogram with revascularization.  Per Dr. Wyn Quakerew, Blood flow optimized on the left leg. Still with disease on the right, and if ulcer does not heal in near future would recommend RLE angiogram as well. Follow up in office in 3 weeks or so with ABIs   * Leukocytosis. WBC is still high, Follow-up CBC.  2. DM2: Controlled. But had 2 episode of hypoglycemia. He was treated with insulin drip, but off insulin drip.  Lantus was decreased from 60 to 40, increase to 45 units HS today (BS 374 last nigh) and moderate-dose sliding scale insulin. discontinued glipizide and metformin.  3. Essential hypertension: controlled, discontinued lisinopril due to low blood pressure and acute renal failure. He reports he does not take metoprolol as prescribed to him due to side effects.  4. CAD:  resumed aspirin and Plavix after procedure. Continue atorvastatin.  5. Ischemic cardiomyopathy EF 35%: Patient states he does not take Lasix due to the side effect of urinating frequently. Watch his fluid status. discontinued lisinopril.  6. Tobacco dependence: Patient states he is trying to quit smoking. Quit back in 2015 he sometimes slips up. Patient is encouraged to continue to stop smoking. Patient counseled 3 minutes.  7. EtOH withdrawal. Continue CIWA protocol. No tremor.   * ARF. possible due to contrast, improving, hold lisinopril and lasix. Discontinued vancomycin. Discontinued NS, follow up BMP.  * Hyponatremia. Hold lasix. Improved with NS iv. * Hypokalemia. improved with K supplement..  * Hyperkalemia today, K5.2, given kayexalate, f/u K.  * Renal artery stenosis Incidental finding of occluded right renal artery during angiogram.  PT evaluation suggested skilled nursing facility placement. Per CM, [facility]  DISCHARGE CONDITIONS:   [facility]  CONSULTS OBTAINED:  Treatment Team:  Mosetta PigeonHarmeet [name], MD  DRUG ALLERGIES:  No Known Allergies  DISCHARGE MEDICATIONS:   Current Discharge Medication List    START taking these medications   Details  collagenase (SANTYL) ointment Apply topically daily. Qty: 15 g, Refills: 0    piperacillin-tazobactam (ZOSYN) 3.375 GM/50ML IVPB Inject 50 mLs (3.375 g total) into the vein every 8 (eight) hours. Qty: 500 mL, Refills: 0      CONTINUE these medications which have NOT CHANGED   Details  albuterol (ACCUNEB) 0.63 MG/3ML nebulizer solution Inhale 3 mLs into the lungs every 6 (six) hours as needed for wheezing or  shortness of breath.     aspirin EC 81 MG tablet Take 81 mg by mouth daily.    atorvastatin (LIPITOR) 80 MG tablet Take 80 mg by mouth at bedtime.    clopidogrel (PLAVIX) 75 MG tablet Take 75 mg by mouth daily.    insulin glargine (LANTUS) 100  UNIT/ML injection Inject 60 Units into the skin at bedtime.    senna-docusate (SENOKOT-S) 8.6-50 MG tablet Take 1 tablet by mouth daily.      STOP taking these medications     gabapentin (NEURONTIN) 300 MG capsule      glipiZIDE (GLUCOTROL) 10 MG tablet      insulin lispro (HUMALOG) 100 UNIT/ML injection      lisinopril (PRINIVIL,ZESTRIL) 10 MG tablet      metFORMIN (GLUCOPHAGE) 500 MG tablet      naproxen (NAPROSYN) 500 MG tablet          DISCHARGE INSTRUCTIONS:    If you experience worsening of your admission symptoms, develop shortness of breath, life threatening emergency, suicidal or homicidal thoughts you must seek medical attention immediately by calling 911 or calling your MD immediately  if symptoms less severe.  You Must read complete instructions/literature along with all the possible adverse reactions/side effects for all the Medicines you take and that have been prescribed to you. Take any new Medicines after you have completely understood and accept all the possible adverse reactions/side effects.   Please note  You were cared for by a hospitalist during your hospital stay. If you have any questions about your discharge medications or the care you received while you were in the hospital after you are discharged, you can call the unit and asked to speak with the hospitalist on call if the hospitalist that took care of you is not available. Once you are discharged, your primary care physician will handle any further medical issues. Please note that NO REFILLS for any discharge medications will be authorized once you are discharged, as it is imperative that you return to your primary care physician (or establish a relationship with a primary care physician if you do not have one) for your aftercare needs so that they can reassess your need for medications and monitor your lab values.    Today   SUBJECTIVE   the patient is AAO 3. No tremor but weakness.   VITAL  SIGNS:  Blood pressure 127/55, pulse 88, temperature 100.3 F (37.9 C), temperature source Oral, resp. rate 17, height  (1.727 m), weight 81.647 kg (180 lb), SpO2 93 %.  I/O:   Intake/Output Summary (Last 24 hours) at 01/14/15 1242 Last data filed at 01/14/15 1229  Gross per 24 hour  Intake    120 ml  Output   2275 ml  Net  -2155 ml    PHYSICAL EXAMINATION:  GENERAL: 64 y.o.-year-old patient lying in the bed with lethargy.  EYES: Pupils equal, round, reactive to light and accommodation. No scleral icterus. Extraocular muscles intact.  HEENT: Head atraumatic, normocephalic. Oropharynx and nasopharynx clear.  NECK: Supple, no jugular venous distention. No thyroid enlargement, no tenderness.  LUNGS: Normal breath sounds bilaterally, no wheezing, rales,rhonchi or crepitation. No use of accessory muscles of respiration.  CARDIOVASCULAR: S1, S2 normal. No murmurs, rubs, or gallops.  ABDOMEN: Soft, nontender, nondistended. Bowel sounds present. No organomegaly or mass.  EXTREMITIES: No pedal edema, cyanosis, or clubbing. Better left foot and leg tenderness, warmness and erythema under knee, foot in dressing. NEUROLOGIC: Cranial nerves II through XII are  intact. Muscle strength 4/5 in all extremities. Sensation intact. Gait not checked. No tremor. PSYCHIATRIC: The patient is alert and oriented x 3. Drowsy. SKIN: No obvious rash, lesion, or ulcer.   DATA REVIEW:   CBC  Recent Labs Lab 01/14/15 0426  WBC 20.8*  HGB 11.0*  HCT 33.3*  PLT 283    Chemistries   Recent Labs Lab 01/08/15 0846  01/10/15 1121  01/14/15 0426  NA 128*  < >  --   < > 135  K 5.0  < >  --   < > 5.2*  CL 87*  < >  --   < > 97*  CO2 30  < >  --   < > 30  GLUCOSE 833*  < >  --   < > 218*  BUN 19  < >  --   < > 41*  CREATININE 0.91  < >  --   < > 2.91*  CALCIUM 7.2*  < >  --   < > 7.1*  MG  --   < > 2.8*  --   --   AST 14*  --   --   --   --   ALT 14*  --   --   --   --   ALKPHOS 127*  --    --   --   --   BILITOT 2.1*  --   --   --   --   < > = values in this interval not displayed.  Cardiac Enzymes No results for input(s): TROPONINI in the last 168 hours.  Microbiology Results  Results for orders placed or performed during the hospital encounter of 01/08/15  Blood culture (routine x 2)     Status: None   Collection Time: 01/08/15  8:46 AM  Result Value Ref Range Status   Specimen Description BLOOD LEFT ASSIST CONTROL  Final   Special Requests   Final    BOTTLES DRAWN AEROBIC AND ANAEROBIC  5CC AERO 2CC ANAERO   Culture NO GROWTH 5 DAYS  Final   Report Status 01/13/2015 FINAL  Final  Blood culture (routine x 2)     Status: None   Collection Time: 01/08/15  9:29 AM  Result Value Ref Range Status   Specimen Description BLOOD RIGHT ARM  Final   Special Requests BOTTLES DRAWN AEROBIC AND ANAEROBIC  1CC  Final   Culture NO GROWTH 5 DAYS  Final   Report Status 01/13/2015 FINAL  Final  MRSA PCR Screening     Status: None   Collection Time: 01/08/15 11:53 AM  Result Value Ref Range Status   MRSA by PCR NEGATIVE NEGATIVE Final    Comment:        The GeneXpert MRSA Assay (FDA approved for NASAL specimens only), is one component of a comprehensive MRSA colonization surveillance program. It is not intended to diagnose MRSA infection nor to guide or monitor treatment for MRSA infections.   Wound culture     Status: None   Collection Time: 01/08/15  4:18 PM  Result Value Ref Range Status   Specimen Description WOUND  Final   Special Requests Immunocompromised  Final   Gram Stain   Final    FEW WBC SEEN MODERATE GRAM POSITIVE COCCI IN PAIRS IN CHAINS    Culture   Final    MODERATE GROWTH STREPTOCOCCUS GROUP G There is no known Penicillin Resistant Beta Streptococcus in the [place] For patients that are Penicillin-allergic, Erythromycin  is 85-94% susceptible, and Clindamycin is 80% susceptible.  Contact Microbiology within 7 days if sensitivity testing is  required.       Report Status 01/12/2015 FINAL  Final    RADIOLOGY:  Dg Chest 1 View  01/13/2015  CLINICAL DATA:  Acute onset of shortness of breath. Initial encounter. EXAM: CHEST 1 VIEW COMPARISON:  Chest radiograph performed 01/08/2015 FINDINGS: Right-sided airspace opacification may reflect pneumonia or asymmetric pulmonary edema. A small right pleural effusion is again noted. The left lung appears relatively clear. Mild vascular congestion is noted. The cardiomediastinal silhouette is borderline enlarged. No acute osseous abnormalities are identified. IMPRESSION: Right-sided airspace opacification may reflect pneumonia or asymmetric pulmonary edema, worsened from the prior study. Small right pleural effusion again noted. Mild vascular congestion and borderline cardiomegaly. Electronically Signed   By: Roanna Raider M.D.   On: 01/13/2015 05:09   US Renal  01/12/2015  CLINICAL DATA:  Acute renal failure.  Initial encounter. EXAM: RENAL / URINARY TRACT ULTRASOUND COMPLETE COMPARISON:  Abdominal radiographs 06/21/2013. FINDINGS: Right Kidney: Length: 12.9 cm. The renal cortical thickness is preserved. There is mildly increased cortical echogenicity. There is a probable tiny cyst in the lower pole, measuring 9 mm maximally. No hydronephrosis. Left Kidney: Length: 13.6 cm. Echogenicity within normal limits. No mass or hydronephrosis visualized. Bladder: Appears normal for degree of bladder distention. Incidentally noted is a small right pleural effusion and a small amount of perihepatic ascites. IMPRESSION: 1. Both kidneys are normal in size without hydronephrosis. 2. Mildly increased cortical echogenicity on the right. 3. Small right pleural effusion and mild ascites noted. Electronically Signed   By: Carey Bullocks M.D.   On: 01/12/2015 14:16        Management plans discussed with the patient, family and they are in agreement.  CODE STATUS:     Code Status Orders        Start     Ordered    01/08/15 1154  Do not attempt resuscitation (DNR)   Continuous    Question Answer Comment  In the event of cardiac or respiratory ARREST Do not call a "code blue"   In the event of cardiac or respiratory ARREST Do not perform Intubation, CPR, defibrillation or ACLS   In the event of cardiac or respiratory ARREST Use medication by any route, position, wound care, and other measures to relive pain and suffering. May use oxygen, suction and manual treatment of airway obstruction as needed for comfort.      01/08/15 1153      TOTAL TIME TAKING CARE OF THIS PATIENT: 42 minutes.    Shaune Pollack M.D on 01/14/2015 at 12:42 PM  Between 7am to 6pm - Pager - [phone]  After 6pm go to [email or web address] - password [email or web address] [facility]  [place] [facility]  Office  [phone]  CC: Primary care physician; Leotis Shames, MD

## 2015-01-14 NOTE — Care Management (Signed)
Plan is for SNF at discharge although I fear his medical status will not allow him to stay out of the hospital. [facility] and [facility]. Sue Lushndrea with [facility] is reviewing this case. [facility]. No other family present at this visit. His sister is really involved with his care.

## 2015-01-14 NOTE — Care Management (Signed)
[facility] has accepted this patient and patient/family wish to transfer. Dr. Imogene Burnhen paged. Carelink packet will need to be completed; staff undated.

## 2015-01-14 NOTE — Progress Notes (Signed)
Inpatient Diabetes Program Recommendations  AACE/ADA: New Consensus Statement on Inpatient Glycemic Control (2015)  Target Ranges:  Prepandial:   less than 140 mg/dL      Peak postprandial:   less than 180 mg/dL (1-2 hours)      Critically ill patients:  140 - 180 mg/dL  Results for Johnny Lane, Johnny Lane (MRN [id number]) as of 01/14/2015 07:41  Ref. Range 01/13/2015 07:38 01/13/2015 07:54 01/13/2015 11:15 01/13/2015 16:30 01/13/2015 21:02  Glucose-Capillary Latest Ref Range: 65-99 mg/dL 46 (L) 409126 (H) 811297 (H) 335 (H) 374 (H)   Review of Glycemic Control  Current orders for Inpatient glycemic control: Lantus 40 units QHS, Novolog 0-15 units TID with meals, Novolog 0-5 units HS  Inpatient Diabetes Program Recommendations: Insulin - Meal Coverage: Please consider ordering Novolog 10 units TID with meals for meal coverage if patient is eating at least 50% of meals.  Thanks, Orlando PennerMarie Oluwadara Gorman, RN, MSN, CDE Diabetes Coordinator Inpatient Diabetes Program [phone] (Team Pager from 8am to 5pm) [phone] ([facility] office) [phone] [facility] office) [phone] [facility] office)

## 2015-01-14 NOTE — Progress Notes (Signed)
Subjective:  Patient seen at bedside.  Cr currently down to 2.91.  Good UOP of 2.5 liters.   Objective:  Vital signs in last 24 hours:  Temp:  [98.6 F (37 C)-100.3 F (37.9 C)] 100.3 F (37.9 C) (12/27 0721) Pulse Rate:  [82-89] 88 (12/27 0721) Resp:  [17-18] 17 (12/27 0721) BP: (98-127)/(44-58) 127/55 mmHg (12/27 0721) SpO2:  [93 %-98 %] 93 % (12/27 0721)  Weight change:  Filed Weights   01/08/15 0843  Weight: 81.647 kg (180 lb)    Intake/Output:    Intake/Output Summary (Last 24 hours) at 01/14/15 1415 Last data filed at 01/14/15 1405  Gross per 24 hour  Intake    120 ml  Output   2225 ml  Net  -2105 ml     Physical Exam: General:  laying in the bed, no acute distress   HEENT  anicteric, moist mucous membranes   Neck  supple, no masses   Pulm/lungs  normal effort, clear to auscultation bilaterally   CVS/Heart  S1S2 no rubs  Abdomen:   soft, nontender, BS present   Extremities:  left leg cellulitis and toe gangrene   Neurologic:  awake, alert, follows commands  Skin:  left leg cellulitis   Access:        Basic Metabolic Panel:   Recent Labs Lab 01/09/15 0444 01/10/15 0203 01/10/15 1121 01/11/15 0339 01/12/15 0411 01/13/15 0304 01/14/15 0426  NA 129* 129*  --  135 132* 135 135  K 3.2* 4.0  --  4.0 4.2 4.4 5.2*  CL 93* 93*  --  94* 97* 101 97*  CO2 32 31  --  33* 29 29 30   GLUCOSE 173* 238*  --  132* 144* 45* 218*  BUN 20 30*  --  34* 42* 42* 41*  CREATININE 1.62* 2.37*  --  2.90* 3.27* 3.15* 2.91*  CALCIUM 6.5* 6.5*  --  6.9* 6.2* 6.6* 7.1*  MG 2.8*  --  2.8*  --   --   --   --      CBC:  Recent Labs Lab 01/08/15 0846  01/10/15 0203 01/11/15 0339 01/12/15 0411 01/13/15 0304 01/14/15 0426  WBC 22.5*  < > 23.6* 25.2* 19.8* 24.5* 20.8*  NEUTROABS 20.7*  --   --   --   --   --   --   HGB 13.5  < > 12.2* 13.1 10.8* 11.8* 11.0*  HCT 42.9  < > 36.8* 39.9* 31.9* 35.6* 33.3*  MCV 95.1  < > 92.4 94.5 92.0 93.5 93.8  PLT 296  < > 255 281  243 281 283  < > = values in this interval not displayed.    Microbiology:  Recent Results (from the past 720 hour(s))  Blood culture (routine x 2)     Status: None   Collection Time: 01/08/15  8:46 AM  Result Value Ref Range Status   Specimen Description BLOOD LEFT ASSIST CONTROL  Final   Special Requests   Final    BOTTLES DRAWN AEROBIC AND ANAEROBIC  5CC AERO 2CC ANAERO   Culture NO GROWTH 5 DAYS  Final   Report Status 01/13/2015 FINAL  Final  Blood culture (routine x 2)     Status: None   Collection Time: 01/08/15  9:29 AM  Result Value Ref Range Status   Specimen Description BLOOD RIGHT ARM  Final   Special Requests BOTTLES DRAWN AEROBIC AND ANAEROBIC  1CC  Final   Culture NO GROWTH 5 DAYS  Final   Report Status 01/13/2015 FINAL  Final  MRSA PCR Screening     Status: None   Collection Time: 01/08/15 11:53 AM  Result Value Ref Range Status   MRSA by PCR NEGATIVE NEGATIVE Final    Comment:        The GeneXpert MRSA Assay (FDA approved for NASAL specimens only), is one component of a comprehensive MRSA colonization surveillance program. It is not intended to diagnose MRSA infection nor to guide or monitor treatment for MRSA infections.   Wound culture     Status: None   Collection Time: 01/08/15  4:18 PM  Result Value Ref Range Status   Specimen Description WOUND  Final   Special Requests Immunocompromised  Final   Gram Stain   Final    FEW WBC SEEN MODERATE GRAM POSITIVE COCCI IN PAIRS IN CHAINS    Culture   Final    MODERATE GROWTH STREPTOCOCCUS GROUP G There is no known Penicillin Resistant Beta Streptococcus in the [place] For patients that are Penicillin-allergic, Erythromycin is 85-94% susceptible, and Clindamycin is 80% susceptible.  Contact Microbiology within 7 days if sensitivity testing is  required.      Report Status 01/12/2015 FINAL  Final    Coagulation Studies: No results for input(s): LABPROT, INR in the last 72 hours.  Urinalysis: No results  for input(s): COLORURINE, LABSPEC, PHURINE, GLUCOSEU, HGBUR, BILIRUBINUR, KETONESUR, PROTEINUR, UROBILINOGEN, NITRITE, LEUKOCYTESUR in the last 72 hours.  Invalid input(s): APPERANCEUR    Imaging: Dg Chest 1 View  01/13/2015  CLINICAL DATA:  Acute onset of shortness of breath. Initial encounter. EXAM: CHEST 1 VIEW COMPARISON:  Chest radiograph performed 01/08/2015 FINDINGS: Right-sided airspace opacification may reflect pneumonia or asymmetric pulmonary edema. A small right pleural effusion is again noted. The left lung appears relatively clear. Mild vascular congestion is noted. The cardiomediastinal silhouette is borderline enlarged. No acute osseous abnormalities are identified. IMPRESSION: Right-sided airspace opacification may reflect pneumonia or asymmetric pulmonary edema, worsened from the prior study. Small right pleural effusion again noted. Mild vascular congestion and borderline cardiomegaly. Electronically Signed   By: Roanna Raider M.D.   On: 01/13/2015 05:09     Medications:     . antiseptic oral rinse  7 mL Mouth Rinse BID  . aspirin  81 mg Oral Daily  . atorvastatin  80 mg Oral QHS  . clopidogrel  75 mg Oral Daily  . collagenase   Topical Daily  . folic acid  1 mg Oral Daily  . insulin aspart  0-15 Units Subcutaneous TID WC  . insulin aspart  0-5 Units Subcutaneous QHS  . insulin glargine  45 Units Subcutaneous QHS  . multivitamin with minerals  1 tablet Oral Daily  . piperacillin-tazobactam (ZOSYN)  IV  3.375 g Intravenous 3 times per day  . senna-docusate  1 tablet Oral Daily  . thiamine  100 mg Oral Daily   acetaminophen **OR** acetaminophen, alum & mag hydroxide-simeth, bisacodyl, LORazepam **OR** LORazepam, menthol-cetylpyridinium, morphine injection, ondansetron **OR** ondansetron (ZOFRAN) IV, oxyCODONE-acetaminophen, senna-docusate  Assessment/ Plan:  64 y.o. male with a PMHX of peripheral vascular disease,poorly controlled diabetes with neuropathy,  hypertension, ischemic cardiomyopathy with EF of 35%, peripheral artery disease with bilateral lower extremity ulceration , was admitted on 01/08/2015 with worsening left foot redness and swelling, subjective fevers, necrosis of the left third and fourth toe.   1. Acute renal failure/proteinuria. Likely multifactorial including sepsis, concurrent infection, IV contrast exposure, possible contribution from vancomycin  Baseline creatinine appears to be  normal at 0.91 at the time of admission on December 21 -Cr down to 2.91, continues to trend down daily, continue supportive care and avoid any further nephrotoxins.  -  Long term will benefit from ACEi, but hold off for now.  2. Renal artery stenosis Incidental finding of occluded right renal artery during angiogram, will need to monitor blood pressure and for flash pulmonary edema over time.  3. Diabetes, poorly controlled Most recent hemoglobin A1c 13.8%, currently on insulin, management per hospitalist.    LOS: 6 Mabry Tift 12/27/20162:15 PM

## 2015-03-13 ENCOUNTER — Other Ambulatory Visit
Admission: RE | Admit: 2015-03-13 | Discharge: 2015-03-13 | Disposition: A | Discharge status: Home / Self Care | Source: Ambulatory Visit | Attending: Family Medicine | Admitting: Family Medicine

## 2015-03-13 DIAGNOSIS — E119 Type 2 diabetes mellitus without complications: Secondary | ICD-10-CM | POA: Diagnosis not present

## 2015-03-13 DIAGNOSIS — E785 Hyperlipidemia, unspecified: Secondary | ICD-10-CM | POA: Diagnosis not present

## 2015-03-13 DIAGNOSIS — M869 Osteomyelitis, unspecified: Secondary | ICD-10-CM | POA: Diagnosis not present

## 2015-03-13 DIAGNOSIS — E039 Hypothyroidism, unspecified: Secondary | ICD-10-CM | POA: Diagnosis not present

## 2015-03-13 DIAGNOSIS — I509 Heart failure, unspecified: Secondary | ICD-10-CM | POA: Insufficient documentation

## 2015-03-13 LAB — COMPREHENSIVE METABOLIC PANEL
ALBUMIN: 3.3 g/dL — AB (ref 3.5–5.0)
ALK PHOS: 101 U/L (ref 38–126)
ALT: 47 U/L (ref 17–63)
ANION GAP: 9 (ref 5–15)
AST: 34 U/L (ref 15–41)
BUN: 24 mg/dL — ABNORMAL HIGH (ref 6–20)
CALCIUM: 9.1 mg/dL (ref 8.9–10.3)
CO2: 31 mmol/L (ref 22–32)
Chloride: 101 mmol/L (ref 101–111)
Creatinine, Ser: 0.88 mg/dL (ref 0.61–1.24)
GFR calc Af Amer: >60 mL/min (ref >60)
GFR calc non Af Amer: >60 mL/min (ref >60)
GLUCOSE: 200 mg/dL — AB (ref 65–99)
Potassium: 4.6 mmol/L (ref 3.5–5.1)
SODIUM: 141 mmol/L (ref 135–145)
Total Bilirubin: 1.1 mg/dL (ref 0.3–1.2)
Total Protein: 7.4 g/dL (ref 6.5–8.1)

## 2015-03-13 LAB — CBC WITH DIFFERENTIAL/PLATELET
BASOS PCT: 1 %
Basophils Absolute: 0 10*3/uL (ref 0–0.1)
EOS ABS: 0.2 10*3/uL (ref 0–0.7)
Eosinophils Relative: 3 %
HCT: 28.7 % — ABNORMAL LOW (ref 40.0–52.0)
HEMOGLOBIN: 9.7 g/dL — AB (ref 13.0–18.0)
Lymphocytes Relative: 10 %
Lymphs Abs: 0.8 10*3/uL — ABNORMAL LOW (ref 1.0–3.6)
MCH: 30.2 pg (ref 26.0–34.0)
MCHC: 33.7 g/dL (ref 32.0–36.0)
MCV: 89.6 fL (ref 80.0–100.0)
MONOS PCT: 7 %
Monocytes Absolute: 0.6 10*3/uL (ref 0.2–1.0)
NEUTROS PCT: 79 %
Neutro Abs: 6.2 10*3/uL (ref 1.4–6.5)
Platelets: 363 10*3/uL (ref 150–440)
RBC: 3.2 MIL/uL — AB (ref 4.40–5.90)
RDW: 14.3 % (ref 11.5–14.5)
WBC: 7.8 10*3/uL (ref 3.8–10.6)

## 2015-03-13 LAB — TSH: TSH: 3.808 u[IU]/mL (ref 0.350–4.500)

## 2015-03-13 LAB — LIPID PANEL
Cholesterol: 103 mg/dL (ref 0–200)
HDL: 37 mg/dL — AB (ref >40)
LDL CALC: 52 mg/dL (ref 0–99)
Total CHOL/HDL Ratio: 2.8 RATIO
Triglycerides: 71 mg/dL (ref <150)
VLDL: 14 mg/dL (ref 0–40)

## 2015-03-13 LAB — BRAIN NATRIURETIC PEPTIDE: B Natriuretic Peptide: 359 pg/mL — ABNORMAL HIGH (ref 0.0–100.0)

## 2015-03-14 LAB — C-REACTIVE PROTEIN: CRP: 0.7 mg/dL (ref <1.0)

## 2015-03-19 ENCOUNTER — Encounter: Payer: Medicare Other | Attending: Internal Medicine | Admitting: Internal Medicine

## 2015-03-19 DIAGNOSIS — E1151 Type 2 diabetes mellitus with diabetic peripheral angiopathy without gangrene: Secondary | ICD-10-CM | POA: Diagnosis not present

## 2015-03-19 DIAGNOSIS — I2510 Atherosclerotic heart disease of native coronary artery without angina pectoris: Secondary | ICD-10-CM | POA: Diagnosis not present

## 2015-03-19 DIAGNOSIS — E11621 Type 2 diabetes mellitus with foot ulcer: Secondary | ICD-10-CM | POA: Insufficient documentation

## 2015-03-19 DIAGNOSIS — M1990 Unspecified osteoarthritis, unspecified site: Secondary | ICD-10-CM | POA: Diagnosis not present

## 2015-03-19 DIAGNOSIS — I509 Heart failure, unspecified: Secondary | ICD-10-CM | POA: Insufficient documentation

## 2015-03-19 DIAGNOSIS — Z87891 Personal history of nicotine dependence: Secondary | ICD-10-CM | POA: Insufficient documentation

## 2015-03-19 DIAGNOSIS — L97523 Non-pressure chronic ulcer of other part of left foot with necrosis of muscle: Secondary | ICD-10-CM | POA: Diagnosis not present

## 2015-03-19 DIAGNOSIS — Z9981 Dependence on supplemental oxygen: Secondary | ICD-10-CM | POA: Insufficient documentation

## 2015-03-19 DIAGNOSIS — I110 Hypertensive heart disease with heart failure: Secondary | ICD-10-CM | POA: Diagnosis not present

## 2015-03-19 DIAGNOSIS — Z794 Long term (current) use of insulin: Secondary | ICD-10-CM | POA: Insufficient documentation

## 2015-03-19 DIAGNOSIS — M60074 Infective myositis, left foot: Secondary | ICD-10-CM | POA: Insufficient documentation

## 2015-03-20 ENCOUNTER — Other Ambulatory Visit
Admission: RE | Admit: 2015-03-20 | Discharge: 2015-03-20 | Disposition: A | Discharge status: Home / Self Care | Payer: Medicare (Managed Care) | Source: Ambulatory Visit | Attending: Internal Medicine | Admitting: Internal Medicine

## 2015-03-20 DIAGNOSIS — R238 Other skin changes: Secondary | ICD-10-CM | POA: Insufficient documentation

## 2015-03-20 NOTE — Progress Notes (Signed)
[place], Benz ([id number]) Visit Report for 03/19/2015 Abuse/Suicide Risk Screen Details Patient Name: Johnny Lane Date of Service: 03/19/2015 8:45 AM Medical Record Number: [id number] Patient Account Number: [id number] Date of Birth/Sex: 25-Jul-1950 (65 y.o. Male) Treating RN: Clover Mealy, RN, BSN, [place] Sink Primary Care Physician: Leotis Shames Other Clinician: Referring Physician: Treating Physician/Extender: Maxwell Caul Weeks in Treatment: 0 Abuse/Suicide Risk Screen Items Answer ABUSE/SUICIDE RISK SCREEN: Has anyone close to you tried to hurt or harm you recentlyo No Do you feel uncomfortable with anyone in your familyo No Has anyone forced you do things that you didnot want to doo No Do you have any thoughts of harming yourselfo No Patient displays signs or symptoms of abuse and/or neglect. No Electronic Signature(s) Signed: 03/19/2015 3:53:31 PM By: Elpidio Eric BSN, RN Entered By: Elpidio Eric on 03/19/2015 09:10:27 Andrey Campanile, Blaze ([id number]) -------------------------------------------------------------------------------- Activities of Daily Living Details Patient Name: Johnny Lane Date of Service: 03/19/2015 8:45 AM Medical Record Number: [id number] Patient Account Number: [id number] Date of Birth/Sex: [birth year] (65 y.o. Male) Treating RN: Clover Mealy, RN, BSN, [place] Primary Care Physician: Leotis Shames Other Clinician: Referring Physician: Treating Physician/Extender: Maxwell Caul Weeks in Treatment: 0 Activities of Daily Living Items Answer Activities of Daily Living (Please select one for each item) Drive Automobile Not Able Take Medications Need Assistance Use Telephone Need Assistance Care for Appearance Need Assistance Use Toilet Need Assistance Bath / Shower Need Assistance Dress Self Need Assistance Feed Self Need Assistance Walk Need Assistance Get In / Out Bed Need Assistance Housework Need Assistance Prepare Meals Need Assistance Handle Money Need  Assistance Shop for Self Need Assistance Electronic Signature(s) Signed: 03/19/2015 3:53:31 PM By: Elpidio Eric BSN, RN Entered By: Elpidio Eric on 03/19/2015 09:10:07 Andrey Campanile, Jeyson ([id number]) -------------------------------------------------------------------------------- Education Assessment Details Patient Name: Johnny Lane Date of Service: 03/19/2015 8:45 AM Medical Record Number: [id number] Patient Account Number: [id number] Date of Birth/Sex: 1950/04/05 (65 y.o. Male) Treating RN: Clover Mealy, RN, BSN, [place] Sink Primary Care Physician: Leotis Shames Other Clinician: Referring Physician: Treating Physician/Extender: Altamese [place] in Treatment: 0 Primary Learner Assessed: Patient Learning Preferences/Education Level/Primary Language Learning Preference: Explanation Highest Education Level: High School Preferred Language: English Cognitive Barrier Assessment/Beliefs Language Barrier: No Physical Barrier Assessment Impaired Vision: No Impaired Hearing: No Decreased Hand dexterity: No Knowledge/Comprehension Assessment Knowledge Level: Medium Comprehension Level: Medium Ability to understand written Medium instructions: Ability to understand verbal Medium instructions: Motivation Assessment Anxiety Level: Calm Cooperation: Cooperative Education Importance: Acknowledges Need Interest in Health Problems: Asks Questions Perception: Coherent Willingness to Engage in Self- Medium Management Activities: Readiness to Engage in Self- Medium Management Activities: Electronic Signature(s) Signed: 03/19/2015 3:53:31 PM By: Elpidio Eric BSN, RN Entered By: Elpidio Eric on 03/19/2015 09:10:59 Haack, Tori ([id number]) -------------------------------------------------------------------------------- Fall Risk Assessment Details Patient Name: Johnny Lane Date of Service: 03/19/2015 8:45 AM Medical Record Number: [id number] Patient Account Number: [id number] Date of Birth/Sex:  [birth year] (65 y.o. Male) Treating RN: Clover Mealy, RN, BSN, Psychologist, clinical Primary Care Physician: Leotis Shames Other Clinician: Referring Physician: Treating Physician/Extender: Altamese [place] in Treatment: 0 Fall Risk Assessment Items Have you had 2 or more falls in the last 12 monthso 0 No Have you had any fall that resulted in injury in the last 12 monthso 0 No FALL RISK ASSESSMENT: History of falling - immediate or within 3 months 25 Yes Secondary diagnosis 0 No Ambulatory aid None/bed rest/wheelchair/nurse 0 Yes Crutches/cane/walker 0 No Furniture 0 No IV Access/Saline Lock 0 No Gait/Training Normal/bed rest/immobile 0 No Weak 10 Yes Impaired 0 No Mental  Status Oriented to own ability 0 Yes Electronic Signature(s) Signed: 03/19/2015 3:53:31 PM By: Elpidio Eric BSN, RN Entered By: Elpidio Eric on 03/19/2015 09:11:15 Harris, Gopal ([id number]) -------------------------------------------------------------------------------- Foot Assessment Details Patient Name: Johnny Lane Date of Service: 03/19/2015 8:45 AM Medical Record Number: [id number] Patient Account Number: [id number] Date of Birth/Sex: Jan 29, 1950 (65 y.o. Male) Treating RN: Clover Mealy, RN, BSN, [place] Primary Care Physician: Leotis Shames Other Clinician: Referring Physician: Treating Physician/Extender: Maxwell Caul Weeks in Treatment: 0 Foot Assessment Items Site Locations + = Sensation present, - = Sensation absent, C = Callus, U = Ulcer R = Redness, W = Warmth, M = Maceration, PU = Pre-ulcerative lesion F = Fissure, S = Swelling, D = Dryness Assessment Right: Left: Other Deformity: No No Prior Foot Ulcer: No No Prior Amputation: No No Charcot Joint: No No Ambulatory Status: Ambulatory With Help Assistance Device: Wheelchair Gait: Surveyor, mining) Signed: 03/19/2015 3:53:31 PM By: Elpidio Eric BSN, RN Entered By: Elpidio Eric on 03/19/2015 09:12:57 Rishel, Deforest  ([id number]) -------------------------------------------------------------------------------- Nutrition Risk Assessment Details Patient Name: Johnny Lane Date of Service: 03/19/2015 8:45 AM Medical Record Number: [id number] Patient Account Number: [id number] Date of Birth/Sex: 1950/09/17 (65 y.o. Male) Treating RN: Clover Mealy, RN, BSN, [place] Primary Care Physician: Leotis Shames Other Clinician: Referring Physician: Treating Physician/Extender: Maxwell Caul Weeks in Treatment: 0 Height (in): 68 Weight (lbs): 192 Body Mass Index (BMI): 29.2 Nutrition Risk Assessment Items NUTRITION RISK SCREEN: I have an illness or condition that made me change the kind and/or 0 No amount of food I eat I eat fewer than two meals per day 0 No I eat few fruits and vegetables, or milk products 0 No I have three or more drinks of beer, liquor or wine almost every day 0 No I have tooth or mouth problems that make it hard for me to eat 0 No I don't always have enough money to buy the food I need 0 No I eat alone most of the time 0 No I take three or more different prescribed or over-the-counter drugs a 0 No day Without wanting to, I have lost or gained 10 pounds in the last six 2 Yes months I am not always physically able to shop, cook and/or feed myself 2 Yes Nutrition Protocols Good Risk Protocol Provide education on Moderate Risk Protocol 0 nutrition Electronic Signature(s) Signed: 03/19/2015 3:53:31 PM By: Elpidio Eric BSN, RN Entered By: Elpidio Eric on 03/19/2015 09:12:03

## 2015-03-21 NOTE — Progress Notes (Addendum)
[place] Clair[place]ILSON, Mackson ([id number]) Visit Report for 03/19/2015 Allergy List Details Patient Name: Johnny Lane, Johnny Lane Date of Service: 03/19/2015 8:45 AM Medical Record Number: [id number] Patient Account Number: [id number] Date of Birth/Sex: 10/28/1950 [age] y.o. Male) Treating RN: Clover MealyAfful, RN, BSN, [place] Primary Care Physician: Leotis ShamesSINGH, [name] Other Clinician: Referring Physician: Leotis ShamesSINGH, [name] Treating Physician/Extender: Maxwell CaulOBSON, MICHAEL G Weeks in Treatment: 0 Allergies Active Allergies No Known Allergies Allergy Notes Electronic Signature(s) Signed: 03/19/2015 3:53:31 PM By: Elpidio EricAfful, Rita BSN, RN Entered By: Elpidio EricAfful, Rita on 03/19/2015 09:04:56 Vice, Kacy ([id number]) -------------------------------------------------------------------------------- Arrival Information Details Patient Name: Johnny Lane, Johnny Lane Date of Service: 03/19/2015 8:45 AM Medical Record Number: [id number] Patient Account Number: [id number] Date of Birth/Sex: 01/15/1951 [age] y.o. Male) Treating RN: Afful, RN, BSN, Rita Primary Care Physician: Leotis ShamesSINGH, [name] Other Clinician: Referring Physician: Leotis ShamesSINGH, [name] Treating Physician/Extender: Altamese CarolinaOBSON, MICHAEL G Weeks in Treatment: 0 Visit Information Patient Arrived: Ambulatory Arrival Time: 09:01 Accompanied By: caregiver Transfer Assistance: EasyPivot Patient Lift Patient Identification Verified:Yes Secondary Verification Process Yes Completed: Patient Requires Transmission- No Based Precautions: Patient Has Alerts: Yes Patient Alerts: Patient on Blood Thinner plavix 03/11/15 ABI: L:0.93, R:0.87 [facility] notes History Since Last Visit All ordered tests and consults were completed: No Added or deleted any medications: No Any new allergies or adverse reactions: No Had a fall or experienced change in activities of daily living that may affect risk of falls: No Signs or symptoms of abuse/neglect since last visito No Hospitalized since last visit: Yes Has Dressing in Place as Prescribed:  No Has Compression in Place as Prescribed: No Electronic Signature(s) Signed: 03/19/2015 3:53:31 PM By: Elpidio EricAfful, Rita BSN, RN Entered By: Elpidio EricAfful, Rita on 03/19/2015 11:30:33 Johnny Lane, Johnny Lane ([id number]) -------------------------------------------------------------------------------- Clinic Level of Care Assessment Details Patient Name: Johnny Lane, Johnny Lane Date of Service: 03/19/2015 8:45 AM Medical Record Number: [id number] Patient Account Number: [id number] Date of Birth/Sex: 07/07/1950 (65 y.o. Male) Treating RN: Clover MealyAfful, RN, BSN, [place] Sinkita Primary Care Physician: Leotis ShamesSINGH, [name] Other Clinician: Referring Physician: Leotis ShamesSINGH, [name] Treating Physician/Extender: Maxwell CaulOBSON, MICHAEL G Weeks in Treatment: 0 Clinic Level of Care Assessment Items TOOL 1 Quantity Score []  - Use when EandM and Procedure is performed on INITIAL visit 0 ASSESSMENTS - Nursing Assessment / Reassessment X - General Physical Exam (combine w/ comprehensive assessment (listed just 1 20 below) when performed on new pt. evals) X - Comprehensive Assessment (HX, ROS, Risk Assessments, Wounds Hx, etc.) 1 25 ASSESSMENTS - Wound and Skin Assessment / Reassessment []  - Dermatologic / Skin Assessment (not related to wound area) 0 ASSESSMENTS - Ostomy and/or Continence Assessment and Care []  - Incontinence Assessment and Management 0 []  - Ostomy Care Assessment and Management (repouching, etc.) 0 PROCESS - Coordination of Care X - Simple Patient / Family Education for ongoing care 1 15 []  - Complex (extensive) Patient / Family Education for ongoing care 0 X - Staff obtains ChiropractorConsents, Records, Test Results / Process Orders 1 10 X - Staff telephones HHA, Nursing Homes / Clarify orders / etc 1 10 []  - Routine Transfer to another Facility (non-emergent condition) 0 []  - Routine Hospital Admission (non-emergent condition) 0 X - New Admissions / Manufacturing engineernsurance Authorizations / Ordering NPWT, Apligraf, etc. 1 15 []  - Emergency Hospital Admission (emergent  condition) 0 PROCESS - Special Needs []  - Pediatric / Minor Patient Management 0 []  - Isolation Patient Management 0 Johnny Lane, Johnny Lane ([id number]) []  - Hearing / Language / Visual special needs 0 []  - Assessment of Community assistance (transportation, D/C planning, etc.) 0 []  - Additional assistance / Altered mentation 0 []  - Support  Surface(s) Assessment (bed, cushion, seat, etc.) 0 INTERVENTIONS - Miscellaneous  - External ear exam 0  - Patient Transfer (multiple staff / Nurse, adult / Similar devices) 0  - Simple Staple / Suture removal (25 or less) 0  - Complex Staple / Suture removal (26 or more) 0  - Hypo/Hyperglycemic Management (do not check if billed separately) 0  - Ankle / Brachial Index (ABI) - do not check if billed separately 0 Has the patient been seen at the hospital within the last three years: Yes Total Score: 95 Level Of Care: New/Established - Level 3 Electronic Signature(s) Signed: 03/19/2015 3:53:31 PM By: Elpidio Eric BSN, RN Entered By: Elpidio Eric on 03/19/2015 10:01:17 Johnny Lane, Johnny Lane ([id number]) -------------------------------------------------------------------------------- Encounter Discharge Information Details Patient Name: Johnny Lane Date of Service: 03/19/2015 8:45 AM Medical Record Number: [id number] Patient Account Number: [id number] Date of Birth/Sex: 1950-10-02 (65 y.o. Male) Treating RN: Clover Mealy, RN, BSN, [place] Sink Primary Care Physician: Leotis Shames Other Clinician: Referring Physician: Leotis Shames Treating Physician/Extender: Altamese [place] in Treatment: 0 Encounter Discharge Information Items Discharge Pain Level: 0 Discharge Condition: Stable Ambulatory Status: Ambulatory Discharge Destination: Home Transportation: Private Auto Accompanied By: caregiver Schedule Follow-up Appointment: No Medication Reconciliation completed and provided to Patient/Care No Nickey Canedo: Provided on Clinical Summary of Care: 03/19/2015 Form  Type Recipient Paper Patient DW Electronic Signature(s) Signed: 03/19/2015 3:53:31 PM By: Elpidio Eric BSN, RN Previous Signature: 03/19/2015 10:20:33 AM Version By: Gwenlyn Perking Entered By: Elpidio Eric on 03/19/2015 11:00:50 Johnny Lane, [place] ([id number]) -------------------------------------------------------------------------------- Lower Extremity Assessment Details Patient Name: Johnny Lane Date of Service: 03/19/2015 8:45 AM Medical Record Number: [id number] Patient Account Number: [id number] Date of Birth/Sex: Dec 02, 1950 (65 y.o. Male) Treating RN: Clover Mealy, RN, BSN, [place] Sink Primary Care Physician: Leotis Shames Other Clinician: Referring Physician: Leotis Shames Treating Physician/Extender: Maxwell Caul Weeks in Treatment: 0 Vascular Assessment Pulses: Posterior Tibial Dorsalis Pedis Palpable: [Left:No] [Right:No] Extremity colors, hair growth, and conditions: Extremity Color: [Left:Hyperpigmented] Hair Growth on Extremity: [Left:Yes] Temperature of Extremity: [Left:Warm] Toe Nail Assessment Left: Right: Thick: Yes Discolored: Yes Deformed: Yes Improper Length and Hygiene: Yes Electronic Signature(s) Signed: 03/19/2015 3:53:31 PM By: Elpidio Eric BSN, RN Entered By: Elpidio Eric on 03/19/2015 09:20:16 Johnny Lane, Johnny Lane ([id number]) -------------------------------------------------------------------------------- Multi Wound Chart Details Patient Name: Johnny Lane Date of Service: 03/19/2015 8:45 AM Medical Record Number: [id number] Patient Account Number: [id number] Date of Birth/Sex: Apr 23, 1950 (65 y.o. Male) Treating RN: Clover Mealy, RN, BSN, Rita Primary Care Physician: Leotis Shames Other Clinician: Referring Physician: Leotis Shames Treating Physician/Extender: Maxwell Caul Weeks in Treatment: 0 Vital Signs Height(in): Pulse(bpm): 72 Weight(lbs): Blood Pressure 135/76 (mmHg): Body Mass Index(BMI): Temperature(F): 97.8 Respiratory Rate 18 (breaths/min): Photos:  [3:No Photos] [4:No Photos] [5:No Photos] Wound Location: [3:Left, Lateral Foot] [4:Left Calcaneus] [5:Left, Plantar Foot] Wounding Event: [3:Gradually Appeared] [4:Gradually Appeared] [5:Gradually Appeared] Primary Etiology: [3:Diabetic Wound/Ulcer of the Lower Extremity] [4:Diabetic Wound/Ulcer of the Lower Extremity] [5:Diabetic Wound/Ulcer of the Lower Extremity] Date Acquired: [3:02/07/2015] [4:02/07/2015] [5:02/07/2015] Weeks of Treatment: [3:0] [4:0] [5:0] Wound Status: [3:Open] [4:Open] [5:Open] Pending Amputation on No [4:No] [5:No] Presentation: Measurements L x W x D 6.8x2.7x0.2 [4:5.5x8x0.2] [5:2x4.5x2.5] (cm) Area (cm) : [3:14.42] [4:34.558] [5:7.069] Volume (cm) : [3:2.884] [4:6.912] [5:17.671] Periwound Skin Texture: No Abnormalities Noted [4:No Abnormalities Noted] [5:No Abnormalities Noted] Periwound Skin [3:No Abnormalities Noted] [4:No Abnormalities Noted] [5:No Abnormalities Noted] Moisture: Periwound Skin Color: No Abnormalities Noted [4:No Abnormalities Noted] [5:No Abnormalities Noted] Tenderness on [3:No] [4:No] [5:No] Wound Number: 6 7 N/A Photos: No Photos No Photos N/A Wound Location: Left,  Dorsal Toe Third Left, Dorsal Toe Fourth N/A Wounding Event: Gradually Appeared Gradually Appeared N/A 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 the Lower Extremity Date Acquired: 02/07/2015 02/07/2015 N/A Weeks of Treatment: 0 0 N/A Wound Status: Open Open N/A Johnny Lane, [place] ([id number]) Pending Amputation on Yes Yes N/A Presentation: Measurements L x W x D 1.5x1.5x0.2 1.5x1x0.1 N/A (cm) Area (cm) : 1.767 1.178 N/A Volume (cm) : 0.353 0.118 N/A Periwound Skin Texture: No Abnormalities Noted No Abnormalities Noted N/A Periwound Skin No Abnormalities Noted No Abnormalities Noted N/A Moisture: Periwound Skin Color: No Abnormalities Noted No Abnormalities Noted N/A Tenderness on No No N/A Palpation: Treatment  Notes Electronic Signature(s) Signed: 03/19/2015 3:53:31 PM By: Elpidio Eric BSN, RN Entered By: Elpidio Eric on 03/19/2015 09:55:40 Johnny Lane, Darragh ([id number]) -------------------------------------------------------------------------------- Multi-Disciplinary Care Plan Details Patient Name: Johnny Lane Date of Service: 03/19/2015 8:45 AM Medical Record Number: [id number] Patient Account Number: [id number] Date of Birth/Sex: 18-May-1950 (65 y.o. Male) Treating RN: Clover Mealy, RN, BSN, Rita Primary Care Physician: Leotis Shames Other Clinician: Referring Physician: Leotis Shames Treating Physician/Extender: Maxwell Caul Weeks in Treatment: 0 Active Inactive Electronic Signature(s) Signed: 09/16/2015 3:30:22 PM By: Elpidio Eric BSN, RN Previous Signature: 03/19/2015 3:53:31 PM Version By: Elpidio Eric BSN, RN Entered By: Elpidio Eric on 04/21/2015 11:15:13 Johnny Lane, [place] ([id number]) -------------------------------------------------------------------------------- Pain Assessment Details Patient Name: Johnny Lane Date of Service: 03/19/2015 8:45 AM Medical Record Number: [id number] Patient Account Number: [id number] Date of Birth/Sex: 06-05-1950 (65 y.o. Male) Treating RN: Clover Mealy, RN, BSN, Rita Primary Care Physician: Leotis Shames Other Clinician: Referring Physician: Leotis Shames Treating Physician/Extender: Maxwell Caul Weeks in Treatment: 0 Active Problems Location of Pain Severity and Description of Pain Patient Has Paino No Site Locations Pain Management and Medication Current Pain Management: Electronic Signature(s) Signed: 03/19/2015 3:53:31 PM By: Elpidio Eric BSN, RN Entered By: Elpidio Eric on 03/19/2015 09:04:48 Leja, Sie ([id number]) -------------------------------------------------------------------------------- Patient/Caregiver Education Details Patient Name: Johnny Lane Date of Service: 03/19/2015 8:45 AM Medical Record Number: [id number] Patient Account Number:  [id number] Date of Birth/Gender: 12-31-1950 (65 y.o. Male) Treating RN: Clover Mealy, RN, BSN, Rita Primary Care Physician: Leotis Shames Other Clinician: Referring Physician: Leotis Shames Treating Physician/Extender: Altamese [place] in Treatment: 0 Education Assessment Education Provided To: Patient Education Topics Provided Basic Hygiene: Methods: Explain/Verbal Responses: State content correctly Infection: Methods: Explain/Verbal Responses: State content correctly Peripheral Neuropathy: Methods: Explain/Verbal Responses: State content correctly Pressure: Methods: Explain/Verbal Responses: State content correctly Safety: Methods: Explain/Verbal Responses: State content correctly [facility]: Methods: Explain/Verbal Responses: State content correctly Wound/Skin Impairment: Methods: Explain/Verbal Responses: State content correctly Electronic Signature(s) Signed: 03/19/2015 3:53:31 PM By: Elpidio Eric BSN, RN Entered By: Elpidio Eric on 03/19/2015 11:01:38 Johnny Lane, Anne ([id number]) Hudson, Teague ([id number]) -------------------------------------------------------------------------------- Wound Assessment Details Patient Name: Johnny Lane Date of Service: 03/19/2015 8:45 AM Medical Record Number: [id number] Patient Account Number: [id number] Date of Birth/Sex: [birth year] (65 y.o. Male) Treating RN: Clover Mealy, RN, BSN,  Sink Primary Care Physician: Leotis Shames Other Clinician: Referring Physician: Leotis Shames Treating Physician/Extender: Maxwell Caul Weeks in Treatment: 0 Wound Status Wound Number: 3 Primary Diabetic Wound/Ulcer of the Lower Etiology: Extremity Wound Location: Left Foot - Lateral Wound Open Wounding Event: Gradually Appeared Status: Date Acquired: 02/07/2015 Comorbid Arrhythmia, Congestive Heart Failure, Weeks Of Treatment: 0 History: Coronary Artery Disease, Hypertension, Clustered Wound: No Type II Diabetes,  Osteoarthritis, Neuropathy Photos Photo Uploaded By: Elpidio Eric on 03/19/2015 12:03:34 Wound Measurements Length: (cm) 6.8 Width: (cm) 2.7 Depth: (cm) 0.2  Area: (cm) 14.42 Volume: (cm) 2.884 % Reduction in Area: 0% % Reduction in Volume: 0% Epithelialization: None Tunneling: No Undermining: No Wound Description Classification: Unable to visualize wound bed Wound Margin: Thickened Exudate Amount: Medium Exudate Type: Serosanguineous Exudate Color: red, brown Foul Odor After Cleansing: No Wound Bed Granulation Amount: None Present (0%) Exposed Structure Necrotic Amount: Large (67-100%) Fascia Exposed: No Necrotic Quality: Eschar Fat Layer Exposed: No Gell, Diana ([id number]) Tendon Exposed: No Muscle Exposed: No Joint Exposed: No Bone Exposed: No Limited to Skin Breakdown Periwound Skin Texture Texture Color No Abnormalities Noted: No No Abnormalities Noted: No Callus: No Atrophie Blanche: No Crepitus: No Cyanosis: No Excoriation: No Ecchymosis: No Fluctuance: No Erythema: No Friable: No Hemosiderin Staining: No Induration: No Mottled: No Localized Edema: No Pallor: No Rash: No Rubor: No Scarring: No Temperature / Pain Moisture Temperature: No Abnormality No Abnormalities Noted: No Dry / Scaly: Yes Maceration: No Moist: No Wound Preparation Ulcer Cleansing: Rinsed/Irrigated with Saline Topical Anesthetic Applied: None Electronic Signature(s) Signed: 03/19/2015 3:53:31 PM By: Elpidio Eric BSN, RN Entered By: Elpidio Eric on 03/19/2015 10:47:32 Odle, Zyquan ([id number]) -------------------------------------------------------------------------------- Wound Assessment Details Patient Name: Johnny Lane Date of Service: 03/19/2015 8:45 AM Medical Record Number: [id number] Patient Account Number: [id number] Date of Birth/Sex: 09/04/1950 (65 y.o. Male) Treating RN: Clover Mealy, RN, BSN, Rita Primary Care Physician: Leotis Shames Other Clinician: Referring  Physician: Leotis Shames Treating Physician/Extender: Maxwell Caul Weeks in Treatment: 0 Wound Status Wound Number: 4 Primary Diabetic Wound/Ulcer of the Lower Etiology: Extremity Wound Location: Left Calcaneus Wound Open Wounding Event: Gradually Appeared Status: Date Acquired: 02/07/2015 Comorbid Arrhythmia, Congestive Heart Failure, Weeks Of Treatment: 0 History: Coronary Artery Disease, Hypertension, Clustered Wound: No Type II Diabetes, Osteoarthritis, Neuropathy Photos Photo Uploaded By: Elpidio Eric on 03/19/2015 12:03:34 Wound Measurements Length: (cm) 5.5 Width: (cm) 8 Depth: (cm) 0.2 Area: (cm) 34.558 Volume: (cm) 6.912 % Reduction in Area: 0% % Reduction in Volume: 0% Epithelialization: None Tunneling: No Undermining: No Wound Description Classification: Unable to visualize wound bed Foul Odor Aft Wound Margin: Thickened Exudate Amount: None Present er Cleansing: No Wound Bed Granulation Amount: None Present (0%) Exposed Structure Necrotic Amount: Large (67-100%) Fascia Exposed: No Necrotic Quality: Eschar Fat Layer Exposed: No Tendon Exposed: No Muscle Exposed: No Heaps, Osamu ([id number]) Joint Exposed: No Bone Exposed: No Limited to Skin Breakdown Periwound Skin Texture Texture Color No Abnormalities Noted: No No Abnormalities Noted: No Callus: No Atrophie Blanche: No Crepitus: No Cyanosis: No Excoriation: No Ecchymosis: No Fluctuance: No Erythema: No Friable: No Hemosiderin Staining: No Induration: No Mottled: No Localized Edema: No Pallor: No Rash: No Rubor: No Scarring: No Temperature / Pain Moisture Temperature: No Abnormality No Abnormalities Noted: No Dry / Scaly: Yes Maceration: No Moist: No Wound Preparation Ulcer Cleansing: Rinsed/Irrigated with Saline Topical Anesthetic Applied: None Electronic Signature(s) Signed: 03/19/2015 3:53:31 PM By: Elpidio Eric BSN, RN Entered By: Elpidio Eric on 03/19/2015  10:48:46 Bursch, Jeiden ([id number]) -------------------------------------------------------------------------------- Wound Assessment Details Patient Name: Johnny Lane Date of Service: 03/19/2015 8:45 AM Medical Record Number: [id number] Patient Account Number: [id number] Date of Birth/Sex: 08-02-1950 (65 y.o. Male) Treating RN: Clover Mealy, RN, BSN, [place] Sink Primary Care Physician: Leotis Shames Other Clinician: Referring Physician: Leotis Shames Treating Physician/Extender: Maxwell Caul Weeks in Treatment: 0 Wound Status Wound Number: 5 Primary Diabetic Wound/Ulcer of the Lower Etiology: Extremity Wound Location: Left Foot - Plantar Wound Open Wounding Event: Gradually Appeared Status: Date Acquired: 02/07/2015 Comorbid Arrhythmia, Congestive Heart Failure, Weeks Of Treatment: 0 History: Coronary Artery Disease,  Hypertension, Clustered Wound: No Type II Diabetes, Osteoarthritis, Neuropathy Photos Photo Uploaded By: Elpidio Eric on 03/19/2015 12:03:55 Wound Measurements Length: (cm) 2 Width: (cm) 4.5 Depth: (cm) 2.5 Area: (cm) 7.069 Volume: (cm) 17.671 % Reduction in Area: 0% % Reduction in Volume: 0% Epithelialization: None Tunneling: No Undermining: No Wound Description Classification: Grade 2 Wound Margin: Distinct, outline attached Exudate Amount: Medium Exudate Type: Serosanguineous Exudate Color: red, brown Foul Odor After Cleansing: No Wound Bed Granulation Amount: None Present (0%) Exposed Structure Necrotic Amount: Large (67-100%) Fascia Exposed: No Necrotic Quality: Adherent Slough Fat Layer Exposed: No Schriver, Marck ([id number]) Tendon Exposed: Yes Muscle Exposed: No Joint Exposed: No Bone Exposed: No Periwound Skin Texture Texture Color No Abnormalities Noted: No No Abnormalities Noted: No Callus: No Atrophie Blanche: No Crepitus: No Cyanosis: No Excoriation: No Ecchymosis: No Fluctuance: No Erythema: No Friable: No Hemosiderin Staining:  No Induration: No Mottled: No Localized Edema: No Pallor: No Rash: No Rubor: No Scarring: No Temperature / Pain Moisture Temperature: No Abnormality No Abnormalities Noted: No Dry / Scaly: No Maceration: No Moist: Yes Wound Preparation Ulcer Cleansing: Rinsed/Irrigated with Saline Topical Anesthetic Applied: None Electronic Signature(s) Signed: 03/19/2015 3:53:31 PM By: Elpidio Eric BSN, RN Entered By: Elpidio Eric on 03/19/2015 10:49:54 [place]anto, Margaret ([id number]) -------------------------------------------------------------------------------- Wound Assessment Details Patient Name: Johnny Lane Date of Service: 03/19/2015 8:45 AM Medical Record Number: [id number] Patient Account Number: [id number] Date of Birth/Sex: 04-04-1950 (65 y.o. Male) Treating RN: Clover Mealy, RN, BSN, Rita Primary Care Physician: Leotis Shames Other Clinician: Referring Physician: Leotis Shames Treating Physician/Extender: Maxwell Caul Weeks in Treatment: 0 Wound Status Wound Number: 6 Primary Diabetic Wound/Ulcer of the Lower Etiology: Extremity Wound Location: Left Toe Third - Dorsal Wound Open Wounding Event: Gradually Appeared Status: Date Acquired: 02/07/2015 Comorbid Arrhythmia, Congestive Heart Failure, Weeks Of Treatment: 0 History: Coronary Artery Disease, Hypertension, Clustered Wound: No Type II Diabetes, Osteoarthritis, Pending Amputation On Presentation Neuropathy Photos Photo Uploaded By: Elpidio Eric on 03/19/2015 12:04:36 Wound Measurements Length: (cm) 1.5 Width: (cm) 1.5 Depth: (cm) 0.2 Area: (cm) 1.767 Volume: (cm) 0.353 % Reduction in Area: 0% % Reduction in Volume: 0% Epithelialization: None Tunneling: No Undermining: No Wound Description Classification: Grade 1 Wound Margin: Distinct, outline attached Exudate Amount: Small Exudate Type: Serosanguineous Exudate Color: red, brown Foul Odor After Cleansing: No Wound Bed Granulation Amount: Small (1-33%) Exposed  Structure Granulation Quality: Pink Fascia Exposed: No Necrotic Amount: None Present (0%) Fat Layer Exposed: No Soderberg, Miroslav ([id number]) Tendon Exposed: No Muscle Exposed: No Joint Exposed: No Bone Exposed: No Limited to Skin Breakdown Periwound Skin Texture Texture Color No Abnormalities Noted: No No Abnormalities Noted: No Callus: No Atrophie Blanche: No Crepitus: No Cyanosis: No Excoriation: No Ecchymosis: No Fluctuance: No Erythema: No Friable: No Hemosiderin Staining: No Induration: No Mottled: No Localized Edema: No Pallor: No Rash: No Rubor: No Scarring: No Temperature / Pain Moisture Temperature: No Abnormality No Abnormalities Noted: No Dry / Scaly: No Maceration: No Moist: Yes Wound Preparation Ulcer Cleansing: Rinsed/Irrigated with Saline Topical Anesthetic Applied: None Electronic Signature(s) Signed: 03/19/2015 3:53:31 PM By: Elpidio Eric BSN, RN Entered By: Elpidio Eric on 03/19/2015 10:51:13 Johnny Lane, [place] ([id number]) -------------------------------------------------------------------------------- Wound Assessment Details Patient Name: Johnny Lane Date of Service: 03/19/2015 8:45 AM Medical Record Number: [id number] Patient Account Number: [id number] Date of Birth/Sex: 10-Nov-1950 (65 y.o. Male) Treating RN: Clover Mealy, RN, BSN, [place] Sink Primary Care Physician: Leotis Shames Other Clinician: Referring Physician: Leotis Shames Treating Physician/Extender: Maxwell Caul Weeks in Treatment: 0 Wound Status Wound Number: 7 Primary  Diabetic Wound/Ulcer of the Lower Etiology: Extremity Wound Location: Left Toe Fourth - Dorsal Wound Open Wounding Event: Gradually Appeared Status: Date Acquired: 02/07/2015 Comorbid Arrhythmia, Congestive Heart Failure, Weeks Of Treatment: 0 History: Coronary Artery Disease, Hypertension, Clustered Wound: No Type II Diabetes, Osteoarthritis, Pending Amputation On Presentation Neuropathy Photos Photo Uploaded By: Elpidio Eric on 03/19/2015 12:04:37 Wound Measurements Length: (cm) 1.5 Width: (cm) 1 Depth: (cm) 0.1 Area: (cm) 1.178 Volume: (cm) 0.118 % Reduction in Area: 0% % Reduction in Volume: 0% Epithelialization: None Tunneling: No Undermining: No Wound Description Classification: Grade 1 Wound Margin: Distinct, outline attached Exudate Amount: Small Exudate Type: Serosanguineous Exudate Color: red, brown Foul Odor After Cleansing: No Wound Bed Granulation Amount: Small (1-33%) Exposed Structure Granulation Quality: Pink Fascia Exposed: No Necrotic Amount: None Present (0%) Fat Layer Exposed: No Dinh, Jermanie ([id number]) Tendon Exposed: No Muscle Exposed: No Joint Exposed: No Bone Exposed: No Limited to Skin Breakdown Periwound Skin Texture Texture Color No Abnormalities Noted: No No Abnormalities Noted: No Callus: No Atrophie Blanche: No Crepitus: No Cyanosis: No Excoriation: No Ecchymosis: No Fluctuance: No Erythema: No Friable: No Hemosiderin Staining: No Induration: No Mottled: No Localized Edema: No Pallor: No Rash: No Rubor: No Scarring: No Temperature / Pain Moisture Temperature: No Abnormality No Abnormalities Noted: No Dry / Scaly: No Maceration: No Moist: Yes Wound Preparation Ulcer Cleansing: Rinsed/Irrigated with Saline Electronic Signature(s) Signed: 03/19/2015 3:53:31 PM By: Elpidio Eric BSN, RN Entered By: Elpidio Eric on 03/19/2015 10:52:47 Johnny Lane, Ezekial ([id number]) -------------------------------------------------------------------------------- Vitals Details Patient Name: Johnny Lane Date of Service: 03/19/2015 8:45 AM Medical Record Number: [id number] Patient Account Number: [id number] Date of Birth/Sex: 1950/11/25 (65 y.o. Male) Treating RN: Clover Mealy, RN, BSN, Rita Primary Care Physician: Leotis Shames Other Clinician: Referring Physician: Leotis Shames Treating Physician/Extender: Maxwell Caul Weeks in Treatment: 0 Vital Signs Time  Taken: 09:15 Temperature (F): 97.8 Pulse (bpm): 72 Respiratory Rate (breaths/min): 18 Blood Pressure (mmHg): 135/76 Reference Range: 80 - 120 mg / dl Electronic Signature(s) Signed: 03/19/2015 3:53:31 PM By: Elpidio Eric BSN, RN Entered By: Elpidio Eric on 03/19/2015 09:25:47

## 2015-03-21 NOTE — Progress Notes (Signed)
[place], [place] ([id number]) Visit Report for 03/19/2015 Chief Complaint Document Details Patient Name: Johnny Lane, Johnny Lane Date of Service: 03/19/2015 8:45 AM Medical Record Number: [id number] Patient Account Number: [id number] Date of Birth/Sex: 1950/06/02 (65 y.o. Male) Treating RN: Clover Mealy, RN, BSN, [place] Sink Primary Care Physician: Leotis Shames Other Clinician: Referring Physician: Treating Physician/Extender: Altamese [place] in Treatment: 0 Information Obtained from: Patient Chief Complaint 03/19/15; the patient is here for review of extensive ulcerations on his left foot referred by Dr. Wyn Quaker for hyperbarics Electronic Signature(s) Signed: 03/20/2015 8:24:42 AM By: Baltazar Najjar MD Entered By: Baltazar Najjar on 03/19/2015 12:56:36 Andrey Campanile, Mc ([id number]) -------------------------------------------------------------------------------- Debridement Details Patient Name: Johnny Lane Date of Service: 03/19/2015 8:45 AM Medical Record Number: [id number] Patient Account Number: [id number] Date of Birth/Sex: Apr 03, 1950 (64 y.o. Male) Treating RN: Clover Mealy, RN, BSN, Rita Primary Care Physician: Leotis Shames Other Clinician: Referring Physician: Treating Physician/Extender: Altamese [place] in Treatment: 0 Debridement Performed for Wound #3 Left,Lateral Foot Assessment: Performed By: Physician Maxwell Caul, MD Debridement: Debridement Pre-procedure Yes Verification/Time Out Taken: Start Time: 09:20 Pain Control: Lidocaine 4% Topical Solution Level: Skin/Subcutaneous Tissue Total Area Debrided (L x 6.8 (cm) x 2.7 (cm) = 18.36 (cm) W): Tissue and other Non-Viable, Eschar material debrided: Instrument: Blade Bleeding: Minimum Hemostasis Achieved: Pressure End Time: 09:25 Procedural Pain: Insensate Post Procedural Pain: Insensate Response to Treatment: Procedure was tolerated well Post Debridement Measurements of Total Wound Length: (cm) 6.8 Width: (cm) 2.7 Depth: (cm)  0.2 Volume: (cm) 2.884 Post Procedure Diagnosis Same as Pre-procedure Electronic Signature(s) Signed: 03/19/2015 3:53:31 PM By: Elpidio Eric BSN, RN Signed: 03/20/2015 8:24:42 AM By: Baltazar Najjar MD Entered By: Baltazar Najjar on 03/19/2015 12:54:26 Andrey Campanile, Romaldo ([id number]) -------------------------------------------------------------------------------- Debridement Details Patient Name: Johnny Lane Date of Service: 03/19/2015 8:45 AM Medical Record Number: [id number] Patient Account Number: [id number] Date of Birth/Sex: July 30, 1950 (64 y.o. Male) Treating RN: Clover Mealy, RN, BSN, Rita Primary Care Physician: Leotis Shames Other Clinician: Referring Physician: Treating Physician/Extender: Maxwell Caul Weeks in Treatment: 0 Debridement Performed for Wound #4 Left Calcaneus Assessment: Performed By: Physician Maxwell Caul, MD Debridement: Open Wound/Selective Debridement Selective Description: Pre-procedure Yes Verification/Time Out Taken: Start Time: 09:25 Pain Control: Lidocaine 4% Topical Solution Level: Non-Viable Tissue Total Area Debrided (L x 5.5 (cm) x 8 (cm) = 44 (cm) W): Tissue and other Non-Viable, Eschar material debrided: Instrument: Blade Bleeding: Minimum Hemostasis Achieved: Pressure End Time: 09:30 Procedural Pain: Insensate Post Procedural Pain: Insensate Response to Treatment: Procedure was tolerated well Post Debridement Measurements of Total Wound Length: (cm) 5.5 Width: (cm) 8 Depth: (cm) 0.2 Volume: (cm) 6.912 Post Procedure Diagnosis Same as Pre-procedure Electronic Signature(s) Signed: 03/19/2015 3:53:31 PM By: Elpidio Eric BSN, RN Signed: 03/20/2015 8:24:42 AM By: Baltazar Najjar MD Entered By: Baltazar Najjar on 03/19/2015 12:54:45 Andrey Campanile, Ziair ([id number]) -------------------------------------------------------------------------------- Debridement Details Patient Name: Johnny Lane Date of Service: 03/19/2015 8:45 AM Medical Record  Number: [id number] Patient Account Number: [id number] Date of Birth/Sex: 1950/12/20 (64 y.o. Male) Treating RN: Clover Mealy, RN, BSN, Rita Primary Care Physician: Leotis Shames Other Clinician: Referring Physician: Treating Physician/Extender: Maxwell Caul Weeks in Treatment: 0 Debridement Performed for Wound #5 Left,Plantar Foot Assessment: Performed By: Physician Maxwell Caul, MD Debridement: Debridement Pre-procedure Yes Verification/Time Out Taken: Start Time: 09:30 Pain Control: Lidocaine 4% Topical Solution Level: Skin/Subcutaneous Tissue Total Area Debrided (L x 2 (cm) x 4.5 (cm) = 9 (cm) W): Tissue and other Non-Viable, Eschar, Fibrin/Slough, Subcutaneous, Tendon material debrided: Instrument: Curette Bleeding: Minimum Hemostasis Achieved: Pressure End Time: 09:35  Procedural Pain: Insensate Post Procedural Pain: Insensate Response to Treatment: Procedure was tolerated well Post Debridement Measurements of Total Wound Length: (cm) 2 Width: (cm) 4.5 Depth: (cm) 2.5 Volume: (cm) 17.671 Post Procedure Diagnosis Same as Pre-procedure Electronic Signature(s) Signed: 03/19/2015 3:53:31 PM By: Elpidio Eric BSN, RN Signed: 03/20/2015 8:24:42 AM By: Baltazar Najjar MD Entered By: Baltazar Najjar on 03/19/2015 12:55:17 Andrey Campanile, Honorio ([id number]) -------------------------------------------------------------------------------- Debridement Details Patient Name: Johnny Lane Date of Service: 03/19/2015 8:45 AM Medical Record Number: [id number] Patient Account Number: [id number] Date of Birth/Sex: 01/25/1950 (65 y.o. Male) Treating RN: Clover Mealy, RN, BSN, Rita Primary Care Physician: Leotis Shames Other Clinician: Referring Physician: Treating Physician/Extender: Maxwell Caul Weeks in Treatment: 0 Debridement Performed for Wound #6 Left,Dorsal Toe Third Assessment: Performed By: Physician Maxwell Caul, MD Debridement: Debridement Pre-procedure  Yes Verification/Time Out Taken: Start Time: 09:35 Pain Control: Lidocaine 4% Topical Solution Level: Skin/Subcutaneous Tissue Total Area Debrided (L x 1.5 (cm) x 1.5 (cm) = 2.25 (cm) W): Tissue and other Non-Viable, Eschar material debrided: Instrument: Blade Bleeding: Minimum Hemostasis Achieved: Pressure End Time: 09:40 Procedural Pain: Insensate Post Procedural Pain: Insensate Response to Treatment: Procedure was tolerated well Post Debridement Measurements of Total Wound Length: (cm) 1.5 Width: (cm) 1.5 Depth: (cm) 0.2 Volume: (cm) 0.353 Post Procedure Diagnosis Same as Pre-procedure Electronic Signature(s) Signed: 03/19/2015 3:53:31 PM By: Elpidio Eric BSN, RN Signed: 03/20/2015 8:24:42 AM By: Baltazar Najjar MD Entered By: Baltazar Najjar on 03/19/2015 12:55:36 Andrey Campanile, [place] ([id number]) -------------------------------------------------------------------------------- Debridement Details Patient Name: Johnny Lane Date of Service: 03/19/2015 8:45 AM Medical Record Number: [id number] Patient Account Number: [id number] Date of Birth/Sex: 1950/02/04 (65 y.o. Male) Treating RN: Clover Mealy, RN, BSN, Rita Primary Care Physician: Leotis Shames Other Clinician: Referring Physician: Treating Physician/Extender: Maxwell Caul Weeks in Treatment: 0 Debridement Performed for Wound #7 Left,Dorsal Toe Fourth Assessment: Performed By: Physician Maxwell Caul, MD Debridement: Debridement Pre-procedure Yes Verification/Time Out Taken: Start Time: 09:40 Pain Control: Lidocaine 4% Topical Solution Level: Skin/Subcutaneous Tissue Total Area Debrided (L x 1.5 (cm) x 1 (cm) = 1.5 (cm) W): Tissue and other Non-Viable, Eschar material debrided: Instrument: Curette Bleeding: Minimum Hemostasis Achieved: Pressure End Time: 09:45 Procedural Pain: Insensate Post Procedural Pain: Insensate Response to Treatment: Procedure was tolerated well Post Debridement Measurements of  Total Wound Length: (cm) 1.5 Width: (cm) 1 Depth: (cm) 0.1 Volume: (cm) 0.118 Post Procedure Diagnosis Same as Pre-procedure Electronic Signature(s) Signed: 03/19/2015 3:53:31 PM By: Elpidio Eric BSN, RN Signed: 03/20/2015 8:24:42 AM By: Baltazar Najjar MD Entered By: Baltazar Najjar on 03/19/2015 12:55:50 Andrey Campanile, Cordie ([id number]) -------------------------------------------------------------------------------- HPI Details Patient Name: Johnny Lane Date of Service: 03/19/2015 8:45 AM Medical Record Number: [id number] Patient Account Number: [id number] Date of Birth/Sex: Sep 26, 1950 (65 y.o. Male) Treating RN: Clover Mealy, RN, BSN, Rita Primary Care Physician: Leotis Shames Other Clinician: Referring Physician: Treating Physician/Extender: Maxwell Caul Weeks in Treatment: 0 History of Present Illness HPI Description: He returns today in followup. He's been followed by Dr. Ether Griffins and podiatry. Dr. Collier Salina performed several debridements. He is referred back here for a skin graft. Unfortunately we did not perform skin grafts but would be happy to send the patient to Dr. Shella Spearing in plastic surgery for her opinion. The patient states that he is willing to consider hyperbaric oxygen therapy as well. He's been using a wound VAC for the last several weeks and is scheduled to followup with Dr. Kerney Elbe in 6 weeks. In the interim he would like to consider hyperbaric oxygen therapy would also like the  consultation with Dr. Evette Cristal. 03/19/15; this is a patient with type 2 diabetes known PAD and apparently neuropathy. He is on insulin. He was hospitalized from 12/21 through 12/27 with cellulitis and gangrene of the left foot. Wound culture showed moderate gram-positive cocci in pairs and chains was treated with vancomycin and Zosyn. Blood cultures were negative. MRI of the foot did not show osteomyelitis but extensive cellulitis and myositis but no osteomyelitis. During this hospitalization on 01/09/15  he underwent a stent to the distal superficial femoral artery and proximal popliteal artery. He underwent angioplasty of the left posterior tibial artery and tibial peroneal trunk and a percutaneous transluminal angioplasty of the left superficial femoral artery and popliteal artery. He also underwent a self expanding stent to the distal superficial femoral artery and proximal popliteal artery as well as a transluminal angioplasty of the right external iliac artery. My understanding was [facility] and then ultimately he has been [facility] to [facility] facility in [facility]. He was recently reviewed by Dr. dew and referred to the wound care center for consideration of hyperbaric oxygen. The patient is currently on 4 L of oxygen. He tells me he was recently on 7 L. He quit smoking a year ago. He has not known to have COPD but he did have limitations in ambulation but he states this was largely due to bilateral knee pain and left calf pain. His hospitalization discharge summary in December lists an ischemic cardiomyopathy with an ejection fraction of 35% although I don't actually see this in [facility]. He is known to have coronary artery disease, hypertension and type 2 diabetes on insulin. Electronic Signature(s) Signed: 03/20/2015 8:24:42 AM By: Baltazar Najjar MD Entered By: Baltazar Najjar on 03/19/2015 13:04:04 Andrey Campanile, Ladell ([id number]) -------------------------------------------------------------------------------- Physical Exam Details Patient Name: Johnny Lane Date of Service: 03/19/2015 8:45 AM Medical Record Number: [id number] Patient Account Number: [id number] Date of Birth/Sex: January 13, 1951 (66 y.o. Male) Treating RN: Clover Mealy, RN, BSN, [place] Sink Primary Care Physician: Leotis Shames Other Clinician: Referring Physician: Treating Physician/Extender: Maxwell Caul Weeks in Treatment: 0 Constitutional Sitting or standing Blood Pressure is within target  range for patient.. Pulse regular and within target range for patient.Marland Kitchen Respirations regular, non-labored and within target range.. Temperature is normal and within the target range for the patient.. O2 sat is 92% on 4 L by nasal cannula. Respiratory Respiratory effort is easy and symmetric bilaterally. Rate is normal at rest and on room air.. Patient has bronchial breathing in the left lower lobe and right lower lobe no wheezing is heard. Cardiovascular Heart rhythm and rate regular, without murmur or gallop. JVP is not elevated. Femoral arteries without bruits and pulses strong.. Pedal pulses absent bilaterally.. Gastrointestinal (GI) Abdomen is soft and non-distended without masses or tenderness. Bowel sounds active in all quadrants.. No liver or spleen enlargement or tenderness.. 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 Neurological Poor vibration sense. Notes Wound exam; the patient has extensive tissue loss in his left foot this includes a large area of eschar over the left heel, the lateral aspect of the left foot first metatarsal head. In the mid aspect of his plantar foot is a large wound with exposed tendon. He has areas over the dorsal third and fourth toes covered by thick adherent eschar. His peripheral pulses are not palpable in the foot. The entire foot is swollen. Electronic Signature(s) Signed: 03/20/2015 8:24:42 AM  By: Baltazar Najjar MD Entered By: Baltazar Najjar on 03/19/2015 13:06:49 Andrey Campanile, Cruise ([id number]) -------------------------------------------------------------------------------- Physician Orders Details Patient Name: Johnny Lane Date of Service: 03/19/2015 8:45 AM Medical Record Number: [id number] Patient Account Number: [id number] Date of Birth/Sex: 05/07/1950 (66 y.o. Male) Treating RN: Clover Mealy, RN, BSN, [place] Sink Primary Care Physician: Leotis Shames Other  Clinician: Referring Physician: Treating Physician/Extender: Altamese [place] in Treatment: 0 Verbal / Phone Orders: Yes Clinician: Afful, RN, BSN, Rita Read Back and Verified: Yes Diagnosis Coding Wound Cleansing Wound #3 Left,Lateral Foot o Clean wound with Normal Saline. Wound #4 Left Calcaneus o Clean wound with Normal Saline. Wound #5 Left,Plantar Foot o Clean wound with Normal Saline. Wound #6 Left,Dorsal Toe Third o Clean wound with Normal Saline. Wound #7 Left,Dorsal Toe Fourth o Clean wound with Normal Saline. Primary Wound Dressing Wound #3 Left,Lateral Foot o Santyl Ointment Wound #4 Left Calcaneus o Santyl Ointment Wound #5 Left,Plantar Foot o Prisma Ag Wound #6 Left,Dorsal Toe Third o Prisma Ag Wound #7 Left,Dorsal Toe Fourth o Prisma Ag Secondary Dressing Wound #3 Left,Lateral Foot o Gauze, ABD and Kerlix/Conform Wound #4 Left Calcaneus Wohl, Phoenyx ([id number]) o Gauze, ABD and Kerlix/Conform Wound #5 Left,Plantar Foot o Gauze, ABD and Kerlix/Conform Wound #6 Left,Dorsal Toe Third o Gauze, ABD and Kerlix/Conform Wound #7 Left,Dorsal Toe Fourth o Gauze, ABD and Kerlix/Conform Dressing Change Frequency Wound #3 Left,Lateral Foot o Change dressing every day. Wound #4 Left Calcaneus o Change dressing every day. Wound #5 Left,Plantar Foot o Change dressing every day. Wound #6 Left,Dorsal Toe Third o Change dressing every day. Wound #7 Left,Dorsal Toe Fourth o Change dressing every day. Follow-up Appointments Wound #3 Left,Lateral Foot o Return Appointment in 1 week. Wound #4 Left Calcaneus o Return Appointment in 1 week. Wound #5 Left,Plantar Foot o Return Appointment in 1 week. Wound #6 Left,Dorsal Toe Third o Return Appointment in 1 week. Wound #7 Left,Dorsal Toe Fourth o Return Appointment in 1 week. Additional Orders / Instructions Wound #3 Left,Lateral Foot o Increase protein  intake. o Activity as tolerated Christine, Heinz ([id number]) Wound #4 Left Calcaneus o Increase protein intake. o Activity as tolerated Wound #5 Left,Plantar Foot o Increase protein intake. o Activity as tolerated Wound #6 Left,Dorsal Toe Third o Increase protein intake. o Activity as tolerated Wound #7 Left,Dorsal Toe Fourth o Increase protein intake. o Activity as tolerated Laboratory o Bacteria identified in Wound by Culture (MICRO) oooo LOINC Code: [id number] oooo Convenience Name: Wound culture routine Radiology o X-ray, Chest Electronic Signature(s) Signed: 03/19/2015 9:59:45 AM By: Elpidio Eric BSN, RN Signed: 03/20/2015 8:24:42 AM By: Baltazar Najjar MD Entered By: Elpidio Eric on 03/19/2015 09:59:44 Keil, Gordie ([id number]) -------------------------------------------------------------------------------- Problem List Details Patient Name: Johnny Lane Date of Service: 03/19/2015 8:45 AM Medical Record Number: [id number] Patient Account Number: [id number] Date of Birth/Sex: 07-16-1950 (65 y.o. 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 Male) Treating RN: Clover Mealy, RN, BSN, [place] Sink Primary Care Physician: Leotis Shames Other Clinician: Referring Physician: Treating Physician/Extender: Altamese [place] in Treatment: 0 Subjective Chief Complaint Information obtained from Patient 03/19/15; the patient is here for review of extensive ulcerations on his left foot referred by Dr. dew for hyperbarics History of Present Illness (HPI) He returns today in followup. He's been followed by Dr. Ether Griffins and podiatry. Dr. Collier Salina performed several debridements. He is referred back here for a skin graft. Unfortunately we did not perform skin grafts but would be happy to send the patient to Dr. Shella Spearing in plastic surgery for her opinion. The patient states that he is willing to consider hyperbaric oxygen therapy as well. He's been using a wound VAC for the last several weeks and is scheduled to followup with Dr. Kerney Elbe in 6 weeks. In the interim he would like to consider hyperbaric oxygen therapy would also like the consultation with Dr. Evette Cristal. 03/19/15; this is a patient with type 2 diabetes known PAD and apparently neuropathy. He is on insulin. He was hospitalized from 12/21 through 12/27 with cellulitis and gangrene of the left foot. Wound culture showed moderate gram-positive cocci in pairs and chains was treated with vancomycin and Zosyn. Blood cultures were negative. MRI of the foot did not show osteomyelitis but extensive cellulitis and myositis but no osteomyelitis. During this hospitalization on 01/09/15 he underwent a stent to the distal superficial femoral artery and proximal popliteal artery. He underwent angioplasty of the left posterior tibial artery and tibial peroneal trunk and a percutaneous transluminal angioplasty of the left superficial femoral artery  and popliteal artery. He also underwent a self expanding stent to the distal superficial femoral artery and proximal popliteal artery as well as a transluminal angioplasty of the right external iliac artery. My understanding was [facility] to [facility] and then ultimately he has been sent to [facility] in [facility]. He was recently reviewed by Dr. dew and referred to the wound care center for consideration of hyperbaric oxygen. The patient is currently on 4 L of oxygen. He tells me he was recently on 7 L. He quit smoking a year ago. He has not known to have COPD but he did have limitations in ambulation but he states this was largely due to bilateral knee pain and left calf pain. His hospitalization discharge summary in December lists an ischemic cardiomyopathy with an ejection fraction of 35% although I don't actually see this in [facility]. He is known to have coronary artery disease, hypertension and type 2 diabetes on insulin. Wound History Patient presents with 5 open wounds that have been present for approximately 23month. Patient has been treating wounds in the following manner: calcium alginate. The wounds have been healed in the past but have re-opened. Laboratory tests have been performed in the last month. Patient reportedly has tested positive for an antibiotic resistant organism. Patient reportedly has not tested positive for osteomyelitis. HARTE, [place] ([id number]) Patient reportedly has had testing performed to evaluate circulation in the legs. Patient experiences the following problems associated with their wounds: infection, swelling. Patient History Information obtained from Patient. Allergies No Known Allergies Family History Diabetes - Mother, Maternal Grandparents, Siblings, Heart Disease - Maternal Grandparents, No family history of Cancer, Hereditary Spherocytosis, Hypertension, Kidney Disease, Lung Disease, Seizures, Stroke, Thyroid  Problems, Tuberculosis. Social History Former smoker, Marital Status -  Single, Alcohol Use - Rarely, Drug Use - No History, Caffeine Use - Rarely - tea. Medical History Endocrine Patient has history of Type II Diabetes Neurologic Patient has history of Neuropathy - Diabetic Patient is treated with Insulin. Blood sugar is tested. Blood sugar results noted at the following times: Breakfast - 270. Hospitalization/Surgery History - 02/07/2015, [facility], left foot wound. - 02/20/2015, heart stent. - 02/20/2015, leg stent. - 02/20/2015, femur artery bypass. Medical And Surgical History Notes Cardiovascular post femoral bypass 05/22/13 stents place4/29/15 Review of Systems (ROS) Eyes The patient has no complaints or symptoms. Ear/Nose/Mouth/Throat The patient has no complaints or symptoms. Hematologic/Lymphatic The patient has no complaints or symptoms. Respiratory Complains or has symptoms of Shortness of Breath. Cardiovascular Complains or has symptoms of LE edema. Gastrointestinal The patient has no complaints or symptoms. Endocrine The patient has no complaints or symptoms. Genitourinary Andrey Campanile, Soren ([id number]) The patient has no complaints or symptoms. Immunological The patient has no complaints or symptoms. Integumentary (Skin) Complains or has symptoms of Wounds, Swelling. Musculoskeletal Complains or has symptoms of Muscle Weakness. Neurologic Complains or has symptoms of Numbness/parasthesias. Oncologic The patient has no complaints or symptoms. Psychiatric The patient has no complaints or symptoms. Objective Constitutional Sitting or standing Blood Pressure is within target range for patient.. Pulse regular and within target range for patient.Marland Kitchen Respirations regular, non-labored and within target range.. Temperature is normal and within the target range for the patient.. O2 sat is 92% on 4 L by nasal cannula. Vitals Time Taken: 9:15 AM, Temperature: 97.8 F, Pulse: 72 bpm,  Respiratory Rate: 18 breaths/min, Blood Pressure: 135/76 mmHg. Respiratory Respiratory effort is easy and symmetric bilaterally. Rate is normal at rest and on room air.. Patient has bronchial breathing in the left lower lobe and right lower lobe no wheezing is heard. Cardiovascular Heart rhythm and rate regular, without murmur or gallop. JVP is not elevated. Femoral arteries without bruits and pulses strong.. Pedal pulses absent bilaterally.. Gastrointestinal (GI) Abdomen is soft and non-distended without masses or tenderness. Bowel sounds active in all quadrants.. No liver or spleen enlargement or tenderness.. Neurological Poor vibration sense. General Notes: Wound exam; the patient has extensive tissue loss in his left foot this includes a large area of eschar over the left heel, the lateral aspect of the left foot first metatarsal head. In the mid aspect of his plantar foot is a large wound with exposed tendon. He has areas over the dorsal third and fourth toes covered by thick adherent eschar. His peripheral pulses are not palpable in the foot. The entire foot is Enders, Vershawn ([id number]) swollen. 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 Wound #3 status is Open. Original cause of wound was Gradually Appeared. The wound is located on the Left,Lateral Foot. The wound measures 6.8cm length x 2.7cm width x 0.2cm depth; 14.42cm^2 area and 2.884cm^3 volume. The wound is limited to skin breakdown. There is no tunneling or undermining noted. There is a medium amount of serosanguineous drainage noted. The wound margin is thickened. There is no granulation within the wound bed. There is a large (67-100%) amount of necrotic tissue within the wound bed including Eschar. The periwound skin appearance exhibited: Dry/Scaly. The periwound skin appearance did not exhibit: Callus, Crepitus,  Excoriation, Fluctuance, Friable, Induration, Localized Edema, Rash, Scarring, Maceration, Moist, Atrophie Blanche, Cyanosis, Ecchymosis, Hemosiderin Staining, Mottled, Pallor, Rubor, Erythema. Periwound temperature was noted as No Abnormality. Wound #4  status is Open. Original cause of wound was Gradually Appeared. The wound is located on the Left Calcaneus. The wound measures 5.5cm length x 8cm width x 0.2cm depth; 34.558cm^2 area and 6.912cm^3 volume. The wound is limited to skin breakdown. There is no tunneling or undermining noted. There is a none present amount of drainage noted. The wound margin is thickened. There is no granulation within the wound bed. There is a large (67-100%) amount of necrotic tissue within the wound bed including Eschar. The periwound skin appearance exhibited: Dry/Scaly. The periwound skin appearance did not exhibit: Callus, Crepitus, Excoriation, Fluctuance, Friable, Induration, Localized Edema, Rash, Scarring, Maceration, Moist, Atrophie Blanche, Cyanosis, Ecchymosis, Hemosiderin Staining, Mottled, Pallor, Rubor, Erythema. Periwound temperature was noted as No Abnormality. Wound #5 status is Open. Original cause of wound was Gradually Appeared. The wound is located on the Left,Plantar Foot. The wound measures 2cm length x 4.5cm width x 2.5cm depth; 7.069cm^2 area and 17.671cm^3 volume. There is tendon exposed. There is no tunneling or undermining noted. There is a medium amount of serosanguineous drainage noted. The wound margin is distinct with the outline attached to the wound base. There is no granulation within the wound bed. There is a large (67-100%) amount of necrotic tissue within the wound bed including Adherent Slough. The periwound skin appearance exhibited: Moist. The periwound skin appearance did not exhibit: Callus, Crepitus, Excoriation, Fluctuance, Friable, Induration, Localized Edema, Rash, Scarring, Dry/Scaly, Maceration, Atrophie Blanche,  Cyanosis, Ecchymosis, Hemosiderin Staining, Mottled, Pallor, Rubor, Erythema. Periwound temperature was noted as No Abnormality. Wound #6 status is Open. Original cause of wound was Gradually Appeared. The wound is located on the Left,Dorsal Toe Third. The wound measures 1.5cm length x 1.5cm width x 0.2cm depth; 1.767cm^2 area and 0.353cm^3 volume. The wound is limited to skin breakdown. There is no tunneling or undermining noted. There is a small amount of serosanguineous drainage noted. The wound margin is distinct with the outline attached to the wound base. There is small (1-33%) pink granulation within the wound bed. There is no necrotic tissue within the wound bed. The periwound skin appearance exhibited: Moist. The periwound skin appearance did not exhibit: Callus, Crepitus, Excoriation, Fluctuance, Friable, Induration, Localized Edema, Rash, Scarring, Dry/Scaly, Maceration, Atrophie Blanche, Cyanosis, Ecchymosis, Hemosiderin Staining, Mottled, Pallor, Rubor, Erythema. Periwound temperature was noted as No Abnormality. Wound #7 status is Open. Original cause of wound was Gradually Appeared. The wound is located on the Left,Dorsal Toe Fourth. The wound measures 1.5cm length x 1cm width x 0.1cm depth; 1.178cm^2 area and 0.118cm^3 volume. The wound is limited to skin breakdown. There is no tunneling or undermining noted. There is a small amount of serosanguineous drainage noted. The wound margin is distinct with the [place], Tavyn ([id number]) outline attached to the wound base. There is small (1-33%) pink granulation within the wound bed. There is no necrotic tissue within the wound bed. The periwound skin appearance exhibited: Moist. The periwound skin appearance did not exhibit: Callus, Crepitus, Excoriation, Fluctuance, Friable, Induration, Localized Edema, Rash, Scarring, Dry/Scaly, Maceration, Atrophie Blanche, Cyanosis, Ecchymosis, Hemosiderin Staining, Mottled, Pallor, Rubor, Erythema.  Periwound temperature was noted as No Abnormality. 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  There was a Skin/Subcutaneous Tissue Debridement ([id number]) debridement with total area of 18.36 sq cm performed by Maxwell Caul, MD. with the following instrument(s): Blade to remove Non-Viable tissue/material including Eschar after achieving pain control using Lidocaine 4% Topical Solution. A time out was conducted prior to the start of the procedure. A Minimum amount of bleeding was controlled with Pressure. The procedure was tolerated well with a pain level of Insensate throughout and a pain level of Insensate following the procedure. Post Debridement Measurements: 6.8cm length x 2.7cm width x 0.2cm depth; 2.884cm^3 volume. Post procedure Diagnosis Wound #3: Same as Pre-Procedure Wound #4 Wound #4 is a Diabetic Wound/Ulcer of the Lower Extremity located on the Left Calcaneus . There was a Non-Viable Tissue Open Wound/Selective [phone]) debridement with total area of 44 sq cm performed by Maxwell Caul, MD. with the following instrument(s): Blade to remove Non-Viable tissue/material including Eschar after achieving pain control using Lidocaine 4% Topical Solution. A time out was conducted prior to the start of the procedure. A Minimum amount of bleeding was controlled with Pressure. The procedure was tolerated well with a pain level of Insensate throughout and a pain level of Insensate following the procedure. Post Debridement Measurements: 5.5cm length x 8cm width x 0.2cm depth; 6.912cm^3 volume. Post procedure  Diagnosis Wound #4: Same as Pre-Procedure Pratt, Mclane ([id number]) Wound #5 Wound #5 is a Diabetic Wound/Ulcer of the Lower Extremity located on the Left,Plantar Foot . There was a Skin/Subcutaneous Tissue Debridement ([id number]) debridement with total area of 9 sq cm performed by Maxwell Caul, MD. with the following instrument(s): Curette to remove Non-Viable tissue/material including Fibrin/Slough, Eschar, Tendon, and Subcutaneous after achieving pain control using Lidocaine 4% Topical Solution. A time out was conducted prior to the start of the procedure. A Minimum amount of bleeding was controlled with Pressure. The procedure was tolerated well with a pain level of Insensate throughout and a pain level of Insensate following the procedure. Post Debridement Measurements: 2cm length x 4.5cm width x 2.5cm depth; 17.671cm^3 volume. Post procedure Diagnosis Wound #5: Same as Pre-Procedure Wound #6 Wound #6 is a Diabetic Wound/Ulcer of the Lower Extremity located on the Left,Dorsal Toe Third . There was a Skin/Subcutaneous Tissue Debridement ([id number]) debridement with total area of 2.25 sq cm performed by Maxwell Caul, MD. with the following instrument(s): Blade to remove Non-Viable tissue/material including Eschar after achieving pain control using Lidocaine 4% Topical Solution. A time out was conducted prior to the start of the procedure. A Minimum amount of bleeding was controlled with Pressure. The procedure was tolerated well with a pain level of Insensate throughout and a pain level of Insensate following the procedure. Post Debridement Measurements: 1.5cm length x 1.5cm width x 0.2cm depth; 0.353cm^3 volume. Post procedure Diagnosis Wound #6: Same as Pre-Procedure Wound #7 Wound #7 is a Diabetic Wound/Ulcer of the Lower Extremity located on the Left,Dorsal Toe Fourth . There was a Skin/Subcutaneous Tissue Debridement ([id number]) debridement with total area of 1.5 sq  cm performed by Maxwell Caul, MD. with the following instrument(s): Curette to remove Non-Viable tissue/material including Eschar after achieving pain control using Lidocaine 4% Topical Solution. A time out was conducted prior to the start of the procedure. A Minimum amount of bleeding was controlled with Pressure. The procedure was tolerated well with a pain level of Insensate throughout and a pain level of Insensate following the procedure. Post Debridement Measurements: 1.5cm length x 1cm width x 0.1cm depth; 0.118cm^3 volume. Post procedure Diagnosis Wound #  7: Same as Pre-Procedure Plan Wound Cleansing: Wound #3 Left,Lateral Foot: Clean wound with Normal Saline. Wound #4 Left Calcaneus: Clean wound with Normal Saline. Wound #5 Left,Plantar Foot: Clean wound with Normal Saline. Wound #6 Left,Dorsal Toe Third: [place], Maximino ([id number]) Clean wound with Normal Saline. Wound #7 Left,Dorsal Toe Fourth: Clean wound with Normal Saline. Primary Wound Dressing: Wound #3 Left,Lateral Foot: Santyl Ointment Wound #4 Left Calcaneus: Santyl Ointment Wound #5 Left,Plantar Foot: Prisma Ag Wound #6 Left,Dorsal Toe Third: Prisma Ag Wound #7 Left,Dorsal Toe Fourth: Prisma Ag Secondary Dressing: Wound #3 Left,Lateral Foot: Gauze, ABD and Kerlix/Conform Wound #4 Left Calcaneus: Gauze, ABD and Kerlix/Conform Wound #5 Left,Plantar Foot: Gauze, ABD and Kerlix/Conform Wound #6 Left,Dorsal Toe Third: Gauze, ABD and Kerlix/Conform Wound #7 Left,Dorsal Toe Fourth: Gauze, ABD and Kerlix/Conform Dressing Change Frequency: Wound #3 Left,Lateral Foot: Change dressing every day. Wound #4 Left Calcaneus: Change dressing every day. Wound #5 Left,Plantar Foot: Change dressing every day. Wound #6 Left,Dorsal Toe Third: Change dressing every day. Wound #7 Left,Dorsal Toe Fourth: Change dressing every day. Follow-up Appointments: Wound #3 Left,Lateral Foot: Return Appointment in 1  week. Wound #4 Left Calcaneus: Return Appointment in 1 week. Wound #5 Left,Plantar Foot: Return Appointment in 1 week. Wound #6 Left,Dorsal Toe Third: Return Appointment in 1 week. Wound #7 Left,Dorsal Toe Fourth: Return Appointment in 1 week. Additional Orders / Instructions: Wound #3 Left,Lateral Foot: Increase protein intake. Activity as tolerated [place], Jeral ([id number]) Wound #4 Left Calcaneus: Increase protein intake. Activity as tolerated Wound #5 Left,Plantar Foot: Increase protein intake. Activity as tolerated Wound #6 Left,Dorsal Toe Third: Increase protein intake. Activity as tolerated Wound #7 Left,Dorsal Toe Fourth: Increase protein intake. Activity as tolerated Laboratory ordered were: Wound culture routine Radiology ordered were: X-ray, Chest #1 cross patched the adherent eschar over his heel and left first metatarsal head in order to give Santyl ointment and to loosen this up. #2 surgical debridement of the wound on the plantar left foot this is obvious exposed tendon. I did a surgical culture of this area but did not provide empiric antibiotics #3 surgical debridement of the dorsal aspect of his third and fourth metatarsal heads neither of which looks particularly viable. #4 with regards to his acceptability for hyperbaric oxygen. He is clearly on high flow oxygen with low O2 sats of. Auscultation of his lungs suggested bronchial breathing left greater than right without wheezing. His air entry is actually fairly good. I have asked for his primary physician at the nursing home A Dr. Robb Matar to look into this. He may have COPD although I am uncertain whether the problem in his lungs as consolidation or effusion. He has also listed as having an ejection fraction of 35% although I don't actually see this Electronic Signature(s) Signed: 03/20/2015 8:24:42 AM By: Baltazar Najjar MD Entered By: Baltazar Najjar on 03/19/2015 13:09:40 Andrey Campanile, Truitt  ([id number]) -------------------------------------------------------------------------------- ROS/PFSH Details Patient Name: Johnny Lane Date of Service: 03/19/2015 8:45 AM Medical Record Number: [id number] Patient Account Number: [id number] Date of Birth/Sex: 06-Aug-1950 (65 y.o. Male) Treating RN: Clover Mealy, RN, BSN, Victor Sink Primary Care Physician: Leotis Shames Other Clinician: Referring Physician: Treating Physician/Extender: Altamese [place] in Treatment: 0 Information Obtained From Patient Wound History Do you currently have one or more open woundso Yes How many open wounds do you currently haveo 5 Approximately how long have you had your woundso 45month How have you been treating your wound(s) until nowo calcium alginate Has your wound(s) ever healed and then re-openedo Yes Have you had  any lab work done in the past montho Yes Have you tested positive for osteomyelitis (bone infection)o No Have you had any tests for circulation on your legso Yes Who ordered the testo Dr. Ether GriffinsFOwler [facility] Have you had other problems associated with your woundso Infection, Swelling Respiratory Complaints and Symptoms: Positive for: Shortness of Breath Medical History: Negative for: Aspiration; Asthma; Chronic Obstructive Pulmonary Disease (COPD); Pneumothorax; Sleep Apnea; Tuberculosis Cardiovascular Complaints and Symptoms: Positive for: LE edema Medical History: Positive for: Arrhythmia; Congestive Heart Failure; Coronary Artery Disease; Hypertension Past Medical History Notes: post femoral bypass 05/22/13 stents place4/29/15 Integumentary (Skin) Complaints and Symptoms: Positive for: Wounds; Swelling Medical HistoryWinf[address]: Salsbury, Taisei ([id number]) Negative for: History of Burn; History of pressure wounds Musculoskeletal Complaints and Symptoms: Positive for: Muscle Weakness Medical History: Positive for: Osteoarthritis Negative for: Gout; Rheumatoid Arthritis;  Osteomyelitis Neurologic Complaints and Symptoms: Positive for: Numbness/parasthesias Medical History: Positive for: Neuropathy - Diabetic Negative for: Dementia; Quadriplegia; Paraplegia; Seizure Disorder Eyes Complaints and Symptoms: No Complaints or Symptoms Medical History: Negative for: Cataracts; Glaucoma; Optic Neuritis Ear/Nose/Mouth/Throat Complaints and Symptoms: No Complaints or Symptoms Medical History: Negative for: Chronic sinus problems/congestion; Middle ear problems Hematologic/Lymphatic Complaints and Symptoms: No Complaints or Symptoms Medical History: Negative for: Anemia; Hemophilia; Human Immunodeficiency Virus; Lymphedema; Sickle Cell Disease Gastrointestinal Complaints and Symptoms: No Complaints or Symptoms Medical History: Negative for: Cirrhosis ; Colitis; Crohnos; Hepatitis A; Hepatitis B; Hepatitis C Spink, Donie ([id number]) Endocrine Complaints and Symptoms: No Complaints or Symptoms Medical History: Positive for: Type II Diabetes Time with diabetes: 22 Treated with: Insulin Blood sugar tested every day: Yes Tested : Blood sugar testing results: Breakfast: 270 Genitourinary Complaints and Symptoms: No Complaints or Symptoms Medical History: Negative for: End Stage Renal Disease Immunological Complaints and Symptoms: No Complaints or Symptoms Medical History: Negative for: Lupus Erythematosus; Raynaudos Oncologic Complaints and Symptoms: No Complaints or Symptoms Medical History: Negative for: Received Chemotherapy; Received Radiation Psychiatric Complaints and Symptoms: No Complaints or Symptoms Medical History: Negative for: Anorexia/bulimia; Confinement Anxiety Hospitalization / Surgery History Name of Hospital Purpose of Hospitalization/Surgery Date [facility] left foot wound 02/07/2015 heart stent 02/20/2015 leg stent 02/20/2015 femur artery bypass 02/20/2015 Johnny RastWILSON, Dierks ([id number]) Family and Social History Cancer: No;  Diabetes: Yes - Mother, Maternal Grandparents, Siblings; Heart Disease: Yes - Maternal Grandparents; Hereditary Spherocytosis: No; Hypertension: No; Kidney Disease: No; Lung Disease: No; Seizures: No; Stroke: No; Thyroid Problems: No; Tuberculosis: No; Former smoker; Marital Status - Single; Alcohol Use: Rarely; Drug Use: No History; Caffeine Use: Rarely - tea; Financial Concerns: No; Food, Clothing or Shelter Needs: No; Support System Lacking: No; Transportation Concerns: No; Advanced Directives: Yes (Not Provided); Patient does not want information on Advanced Directives; Living Will: Yes (Not Provided) Electronic Signature(s) Signed: 03/19/2015 3:53:31 PM By: Elpidio EricAfful, Rita BSN, RN Signed: 03/20/2015 8:24:42 AM By: Baltazar Najjarobson, Michael MD Entered By: Elpidio EricAfful, Rita on 03/19/2015 11:32:24 Andrey CampanileWILSON, Koltan ([id number]) -------------------------------------------------------------------------------- SuperBill Details Patient Name: Johnny RastWILSON, Johncarlos Date of Service: 03/19/2015 Medical Record Number: [id number] Patient Account Number: [id number] Date of Birth/Sex: 02/01/1950 13(64 y.o. Male) Treating RN: Clover MealyAfful, RN, BSN, Rita Primary Care Physician: Leotis ShamesSINGH, [name] Other Clinician: Referring Physician: Treating Physician/Extender: Altamese CarolinaOBSON, MICHAEL G Weeks in Treatment: 0 Diagnosis Coding ICD-10 Codes Code Description [phone] Non-pressure chronic ulcer of other part of left foot with necrosis of muscle E11.621 Type 2 diabetes mellitus with foot ulcer E11.51 Type 2 diabetes mellitus with diabetic peripheral angiopathy without gangrene M60.074 Infective myositis, left foot Facility Procedures CPT4 Code Description: [id number] 99213 -  WOUND CARE VISIT-LEV 3 EST PT Modifier: Quantity: 1 CPT4 Code Description: [id number] 11042 - DEB SUBQ TISSUE 20 SQ CM/< ICD-10 Description Diagnosis E11.621 Type 2 diabetes mellitus with foot ulcer Modifier: Quantity: 1 CPT4 Code Description: [id number] 11045 - DEB SUBQ TISS EA ADDL 20CM  ICD-10 Description Diagnosis L97.523 Non-pressure chronic ulcer of other part of left foot Modifier: with necrosi Quantity: 1 s of muscle CPT4 Code Description: [id number] 97597 - DEBRIDE WOUND 1ST 20 SQ CM OR < ICD-10 Description Diagnosis E11.621 Type 2 diabetes mellitus with foot ulcer Modifier: Quantity: 1 CPT4 Code Description: [id number] 97598 - DEBRIDE WOUND EA ADDL 20 SQ CM ICD-10 Description Diagnosis E11.621 Type 2 diabetes mellitus with foot ulcer Modifier: Quantity: 2 Physician Procedures CPT4 Code Description: [id number] 99204 - [facility] PHYS LEVEL 4 - NEW PT [place], [place] ([id number]) Modifier: 25 Quantity: 1 Electronic Signature(s) Signed: 03/20/2015 8:24:42 AM By: Baltazar Najjar MD Entered By: Baltazar Najjar on 03/19/2015 13:11:06

## 2015-03-24 LAB — WOUND CULTURE

## 2015-03-26 ENCOUNTER — Ambulatory Visit: Payer: Medicare Other | Admitting: Internal Medicine

## 2015-05-29 ENCOUNTER — Emergency Department: Payer: Medicare Other

## 2015-05-29 ENCOUNTER — Inpatient Hospital Stay
Admission: EM | Admit: 2015-05-29 | Discharge: 2015-06-19 | DRG: 871 | Disposition: E | Payer: Medicare Other | Attending: Internal Medicine | Admitting: Internal Medicine

## 2015-05-29 ENCOUNTER — Encounter: Payer: Self-pay | Admitting: Emergency Medicine

## 2015-05-29 DIAGNOSIS — J440 Chronic obstructive pulmonary disease with acute lower respiratory infection: Secondary | ICD-10-CM | POA: Diagnosis present

## 2015-05-29 DIAGNOSIS — Y92230 Patient room in hospital as the place of occurrence of the external cause: Secondary | ICD-10-CM | POA: Diagnosis not present

## 2015-05-29 DIAGNOSIS — N390 Urinary tract infection, site not specified: Secondary | ICD-10-CM | POA: Diagnosis present

## 2015-05-29 DIAGNOSIS — D649 Anemia, unspecified: Secondary | ICD-10-CM | POA: Diagnosis present

## 2015-05-29 DIAGNOSIS — A419 Sepsis, unspecified organism: Secondary | ICD-10-CM | POA: Diagnosis not present

## 2015-05-29 DIAGNOSIS — I471 Supraventricular tachycardia: Secondary | ICD-10-CM | POA: Diagnosis not present

## 2015-05-29 DIAGNOSIS — G8929 Other chronic pain: Secondary | ICD-10-CM | POA: Diagnosis present

## 2015-05-29 DIAGNOSIS — I255 Ischemic cardiomyopathy: Secondary | ICD-10-CM | POA: Diagnosis present

## 2015-05-29 DIAGNOSIS — J9690 Respiratory failure, unspecified, unspecified whether with hypoxia or hypercapnia: Secondary | ICD-10-CM

## 2015-05-29 DIAGNOSIS — R45851 Suicidal ideations: Secondary | ICD-10-CM | POA: Diagnosis not present

## 2015-05-29 DIAGNOSIS — F39 Unspecified mood [affective] disorder: Secondary | ICD-10-CM | POA: Diagnosis not present

## 2015-05-29 DIAGNOSIS — Z794 Long term (current) use of insulin: Secondary | ICD-10-CM | POA: Diagnosis not present

## 2015-05-29 DIAGNOSIS — I5023 Acute on chronic systolic (congestive) heart failure: Secondary | ICD-10-CM | POA: Diagnosis present

## 2015-05-29 DIAGNOSIS — J9600 Acute respiratory failure, unspecified whether with hypoxia or hypercapnia: Secondary | ICD-10-CM

## 2015-05-29 DIAGNOSIS — I214 Non-ST elevation (NSTEMI) myocardial infarction: Secondary | ICD-10-CM | POA: Diagnosis present

## 2015-05-29 DIAGNOSIS — I429 Cardiomyopathy, unspecified: Secondary | ICD-10-CM | POA: Diagnosis not present

## 2015-05-29 DIAGNOSIS — I878 Other specified disorders of veins: Secondary | ICD-10-CM | POA: Diagnosis present

## 2015-05-29 DIAGNOSIS — Z01818 Encounter for other preprocedural examination: Secondary | ICD-10-CM

## 2015-05-29 DIAGNOSIS — Z833 Family history of diabetes mellitus: Secondary | ICD-10-CM

## 2015-05-29 DIAGNOSIS — I70261 Atherosclerosis of native arteries of extremities with gangrene, right leg: Secondary | ICD-10-CM | POA: Diagnosis present

## 2015-05-29 DIAGNOSIS — J9601 Acute respiratory failure with hypoxia: Secondary | ICD-10-CM | POA: Diagnosis not present

## 2015-05-29 DIAGNOSIS — J9811 Atelectasis: Secondary | ICD-10-CM | POA: Diagnosis not present

## 2015-05-29 DIAGNOSIS — J9602 Acute respiratory failure with hypercapnia: Secondary | ICD-10-CM | POA: Diagnosis not present

## 2015-05-29 DIAGNOSIS — Z4659 Encounter for fitting and adjustment of other gastrointestinal appliance and device: Secondary | ICD-10-CM

## 2015-05-29 DIAGNOSIS — R001 Bradycardia, unspecified: Secondary | ICD-10-CM | POA: Diagnosis not present

## 2015-05-29 DIAGNOSIS — E1151 Type 2 diabetes mellitus with diabetic peripheral angiopathy without gangrene: Secondary | ICD-10-CM | POA: Diagnosis present

## 2015-05-29 DIAGNOSIS — B954 Other streptococcus as the cause of diseases classified elsewhere: Secondary | ICD-10-CM | POA: Diagnosis present

## 2015-05-29 DIAGNOSIS — F4321 Adjustment disorder with depressed mood: Secondary | ICD-10-CM | POA: Insufficient documentation

## 2015-05-29 DIAGNOSIS — M869 Osteomyelitis, unspecified: Secondary | ICD-10-CM

## 2015-05-29 DIAGNOSIS — I447 Left bundle-branch block, unspecified: Secondary | ICD-10-CM | POA: Diagnosis present

## 2015-05-29 DIAGNOSIS — J810 Acute pulmonary edema: Secondary | ICD-10-CM | POA: Diagnosis not present

## 2015-05-29 DIAGNOSIS — Z7902 Long term (current) use of antithrombotics/antiplatelets: Secondary | ICD-10-CM | POA: Diagnosis not present

## 2015-05-29 DIAGNOSIS — E11621 Type 2 diabetes mellitus with foot ulcer: Secondary | ICD-10-CM | POA: Diagnosis present

## 2015-05-29 DIAGNOSIS — G9340 Encephalopathy, unspecified: Secondary | ICD-10-CM | POA: Diagnosis present

## 2015-05-29 DIAGNOSIS — I469 Cardiac arrest, cause unspecified: Secondary | ICD-10-CM | POA: Diagnosis not present

## 2015-05-29 DIAGNOSIS — I480 Paroxysmal atrial fibrillation: Secondary | ICD-10-CM | POA: Diagnosis not present

## 2015-05-29 DIAGNOSIS — L97521 Non-pressure chronic ulcer of other part of left foot limited to breakdown of skin: Secondary | ICD-10-CM | POA: Diagnosis present

## 2015-05-29 DIAGNOSIS — I70244 Atherosclerosis of native arteries of left leg with ulceration of heel and midfoot: Secondary | ICD-10-CM | POA: Diagnosis present

## 2015-05-29 DIAGNOSIS — E872 Acidosis: Secondary | ICD-10-CM | POA: Diagnosis present

## 2015-05-29 DIAGNOSIS — E1140 Type 2 diabetes mellitus with diabetic neuropathy, unspecified: Secondary | ICD-10-CM | POA: Diagnosis present

## 2015-05-29 DIAGNOSIS — I5020 Unspecified systolic (congestive) heart failure: Secondary | ICD-10-CM | POA: Diagnosis not present

## 2015-05-29 DIAGNOSIS — Z87891 Personal history of nicotine dependence: Secondary | ICD-10-CM | POA: Diagnosis not present

## 2015-05-29 DIAGNOSIS — Z7982 Long term (current) use of aspirin: Secondary | ICD-10-CM

## 2015-05-29 DIAGNOSIS — Z86718 Personal history of other venous thrombosis and embolism: Secondary | ICD-10-CM

## 2015-05-29 DIAGNOSIS — L03116 Cellulitis of left lower limb: Secondary | ICD-10-CM | POA: Diagnosis not present

## 2015-05-29 DIAGNOSIS — J9819 Other pulmonary collapse: Secondary | ICD-10-CM | POA: Diagnosis not present

## 2015-05-29 DIAGNOSIS — L97421 Non-pressure chronic ulcer of left heel and midfoot limited to breakdown of skin: Secondary | ICD-10-CM | POA: Diagnosis present

## 2015-05-29 DIAGNOSIS — Z79899 Other long term (current) drug therapy: Secondary | ICD-10-CM

## 2015-05-29 DIAGNOSIS — I110 Hypertensive heart disease with heart failure: Secondary | ICD-10-CM | POA: Diagnosis present

## 2015-05-29 DIAGNOSIS — N179 Acute kidney failure, unspecified: Secondary | ICD-10-CM | POA: Diagnosis not present

## 2015-05-29 DIAGNOSIS — Z7189 Other specified counseling: Secondary | ICD-10-CM | POA: Diagnosis not present

## 2015-05-29 DIAGNOSIS — Z66 Do not resuscitate: Secondary | ICD-10-CM | POA: Diagnosis not present

## 2015-05-29 DIAGNOSIS — J449 Chronic obstructive pulmonary disease, unspecified: Secondary | ICD-10-CM | POA: Diagnosis not present

## 2015-05-29 DIAGNOSIS — L03115 Cellulitis of right lower limb: Secondary | ICD-10-CM | POA: Diagnosis present

## 2015-05-29 DIAGNOSIS — J189 Pneumonia, unspecified organism: Secondary | ICD-10-CM | POA: Diagnosis not present

## 2015-05-29 DIAGNOSIS — Z452 Encounter for adjustment and management of vascular access device: Secondary | ICD-10-CM

## 2015-05-29 DIAGNOSIS — Z978 Presence of other specified devices: Secondary | ICD-10-CM

## 2015-05-29 DIAGNOSIS — J9621 Acute and chronic respiratory failure with hypoxia: Secondary | ICD-10-CM | POA: Diagnosis not present

## 2015-05-29 DIAGNOSIS — I959 Hypotension, unspecified: Secondary | ICD-10-CM | POA: Diagnosis not present

## 2015-05-29 DIAGNOSIS — J9622 Acute and chronic respiratory failure with hypercapnia: Secondary | ICD-10-CM | POA: Diagnosis not present

## 2015-05-29 DIAGNOSIS — L97419 Non-pressure chronic ulcer of right heel and midfoot with unspecified severity: Secondary | ICD-10-CM | POA: Diagnosis present

## 2015-05-29 DIAGNOSIS — Z515 Encounter for palliative care: Secondary | ICD-10-CM | POA: Diagnosis not present

## 2015-05-29 DIAGNOSIS — I70245 Atherosclerosis of native arteries of left leg with ulceration of other part of foot: Secondary | ICD-10-CM | POA: Diagnosis present

## 2015-05-29 DIAGNOSIS — R0600 Dyspnea, unspecified: Secondary | ICD-10-CM

## 2015-05-29 DIAGNOSIS — M79606 Pain in leg, unspecified: Secondary | ICD-10-CM

## 2015-05-29 DIAGNOSIS — T501X5A Adverse effect of loop [high-ceiling] diuretics, initial encounter: Secondary | ICD-10-CM | POA: Diagnosis not present

## 2015-05-29 DIAGNOSIS — I739 Peripheral vascular disease, unspecified: Secondary | ICD-10-CM | POA: Diagnosis not present

## 2015-05-29 LAB — CBC WITH DIFFERENTIAL/PLATELET
BASOS ABS: 0 10*3/uL (ref 0–0.1)
Basophils Relative: 0 %
EOS ABS: 0 10*3/uL (ref 0–0.7)
HCT: 28.7 % — ABNORMAL LOW (ref 40.0–52.0)
Hemoglobin: 9.2 g/dL — ABNORMAL LOW (ref 13.0–18.0)
Lymphs Abs: 0.5 10*3/uL — ABNORMAL LOW (ref 1.0–3.6)
MCH: 27.5 pg (ref 26.0–34.0)
MCHC: 32.1 g/dL (ref 32.0–36.0)
MCV: 85.6 fL (ref 80.0–100.0)
Monocytes Absolute: 0.8 10*3/uL (ref 0.2–1.0)
Monocytes Relative: 4 %
Neutro Abs: 18.4 10*3/uL — ABNORMAL HIGH (ref 1.4–6.5)
PLATELETS: 215 10*3/uL (ref 150–440)
RBC: 3.35 MIL/uL — AB (ref 4.40–5.90)
RDW: 16.1 % — ABNORMAL HIGH (ref 11.5–14.5)
WBC: 19.7 10*3/uL — AB (ref 3.8–10.6)

## 2015-05-29 LAB — TROPONIN I: TROPONIN I: 1.85 ng/mL — AB (ref <0.031)

## 2015-05-29 LAB — COMPREHENSIVE METABOLIC PANEL
ALK PHOS: 55 U/L (ref 38–126)
ALT: 77 U/L — ABNORMAL HIGH (ref 17–63)
ANION GAP: 8 (ref 5–15)
AST: 143 U/L — ABNORMAL HIGH (ref 15–41)
Albumin: 2.7 g/dL — ABNORMAL LOW (ref 3.5–5.0)
BUN: 47 mg/dL — ABNORMAL HIGH (ref 6–20)
CALCIUM: 8.4 mg/dL — AB (ref 8.9–10.3)
CO2: 30 mmol/L (ref 22–32)
Chloride: 99 mmol/L — ABNORMAL LOW (ref 101–111)
Creatinine, Ser: 1.63 mg/dL — ABNORMAL HIGH (ref 0.61–1.24)
GFR calc non Af Amer: 42 mL/min — ABNORMAL LOW (ref >60)
GFR, EST AFRICAN AMERICAN: 49 mL/min — AB (ref >60)
Glucose, Bld: 357 mg/dL — ABNORMAL HIGH (ref 65–99)
POTASSIUM: 4.6 mmol/L (ref 3.5–5.1)
SODIUM: 137 mmol/L (ref 135–145)
Total Bilirubin: 1.1 mg/dL (ref 0.3–1.2)
Total Protein: 7.7 g/dL (ref 6.5–8.1)

## 2015-05-29 LAB — URINALYSIS COMPLETE WITH MICROSCOPIC (ARMC ONLY)
BILIRUBIN URINE: NEGATIVE
Bacteria, UA: NONE SEEN
GLUCOSE, UA: 50 mg/dL — AB
Ketones, ur: NEGATIVE mg/dL
LEUKOCYTES UA: NEGATIVE
Nitrite: NEGATIVE
SPECIFIC GRAVITY, URINE: 1.023 (ref 1.005–1.030)
pH: 5 (ref 5.0–8.0)

## 2015-05-29 LAB — APTT: aPTT: 37 seconds — ABNORMAL HIGH (ref 24–36)

## 2015-05-29 LAB — SURGICAL PCR SCREEN
MRSA, PCR: POSITIVE — AB
Staphylococcus aureus: POSITIVE — AB

## 2015-05-29 LAB — HEPARIN LEVEL (UNFRACTIONATED): HEPARIN UNFRACTIONATED: 0.11 [IU]/mL — AB (ref 0.30–0.70)

## 2015-05-29 LAB — LACTIC ACID, PLASMA
LACTIC ACID, VENOUS: 1.9 mmol/L (ref 0.5–2.0)
Lactic Acid, Venous: 2.2 mmol/L (ref 0.5–2.0)

## 2015-05-29 LAB — PROTIME-INR
INR: 1.22
Prothrombin Time: 15.6 seconds — ABNORMAL HIGH (ref 11.4–15.0)

## 2015-05-29 LAB — GLUCOSE, CAPILLARY: Glucose-Capillary: 343 mg/dL — ABNORMAL HIGH (ref 65–99)

## 2015-05-29 MED ORDER — MORPHINE SULFATE (PF) 2 MG/ML IV SOLN: 2.0000 mg | INTRAVENOUS | Status: DC | PRN

## 2015-05-29 MED ORDER — PIPERACILLIN-TAZOBACTAM 3.375 G IVPB 30 MIN
3.3750 g | Freq: Once | INTRAVENOUS | Status: AC
  Administered 2015-05-29: 3.375 g via INTRAVENOUS
  Filled 2015-05-29: qty 50

## 2015-05-29 MED ORDER — CLOPIDOGREL BISULFATE 75 MG PO TABS: 75.0000 mg | ORAL_TABLET | Freq: Every day | ORAL | Status: DC

## 2015-05-29 MED ORDER — HEPARIN BOLUS VIA INFUSION
2500.0000 [IU] | Freq: Once | INTRAVENOUS | Status: AC
  Administered 2015-05-29: 2500 [IU] via INTRAVENOUS
  Filled 2015-05-29: qty 2500

## 2015-05-29 MED ORDER — ALBUTEROL SULFATE (2.5 MG/3ML) 0.083% IN NEBU
1.2500 mg | INHALATION_SOLUTION | Freq: Four times a day (QID) | RESPIRATORY_TRACT | Status: DC | PRN
  Administered 2015-05-31: 1.25 mg via RESPIRATORY_TRACT
  Filled 2015-05-29: qty 3

## 2015-05-29 MED ORDER — ACETAMINOPHEN 500 MG PO TABS
ORAL_TABLET | ORAL | Status: AC
  Administered 2015-05-29: 1000 mg via ORAL
  Filled 2015-05-29: qty 2

## 2015-05-29 MED ORDER — SODIUM CHLORIDE 0.9 % IV BOLUS (SEPSIS)
1000.0000 mL | Freq: Once | INTRAVENOUS | Status: AC
  Administered 2015-05-29: 1000 mL via INTRAVENOUS

## 2015-05-29 MED ORDER — ACETAMINOPHEN 500 MG PO TABS
1000.0000 mg | ORAL_TABLET | Freq: Once | ORAL | Status: AC
  Administered 2015-05-29: 1000 mg via ORAL

## 2015-05-29 MED ORDER — SODIUM CHLORIDE 0.9 % IV SOLN
INTRAVENOUS | Status: DC
  Administered 2015-05-29 – 2015-05-30 (×2): via INTRAVENOUS

## 2015-05-29 MED ORDER — ACETAMINOPHEN 650 MG RE SUPP: 650.0000 mg | Freq: Four times a day (QID) | RECTAL | Status: DC | PRN

## 2015-05-29 MED ORDER — ASPIRIN 81 MG PO CHEW
324.0000 mg | CHEWABLE_TABLET | Freq: Once | ORAL | Status: AC
  Administered 2015-05-29: 324 mg via ORAL
  Filled 2015-05-29: qty 4

## 2015-05-29 MED ORDER — HEPARIN (PORCINE) IN NACL 100-0.45 UNIT/ML-% IJ SOLN
1850.0000 [IU]/h | INTRAMUSCULAR | Status: DC
  Administered 2015-05-29: 1100 [IU]/h via INTRAVENOUS
  Administered 2015-05-30: 1850 [IU]/h via INTRAVENOUS
  Administered 2015-05-30: 1600 [IU]/h via INTRAVENOUS
  Administered 2015-05-31 – 2015-06-02 (×4): 1850 [IU]/h via INTRAVENOUS
  Filled 2015-05-29 (×14): qty 250

## 2015-05-29 MED ORDER — HEPARIN BOLUS VIA INFUSION
4000.0000 [IU] | Freq: Once | INTRAVENOUS | Status: AC
  Administered 2015-05-29: 4000 [IU] via INTRAVENOUS
  Filled 2015-05-29: qty 4000

## 2015-05-29 MED ORDER — INSULIN GLARGINE 100 UNIT/ML ~~LOC~~ SOLN
20.0000 [IU] | Freq: Every day | SUBCUTANEOUS | Status: DC
  Administered 2015-05-29 – 2015-05-30 (×2): 20 [IU] via SUBCUTANEOUS
  Filled 2015-05-29 (×2): qty 0.2

## 2015-05-29 MED ORDER — PIPERACILLIN-TAZOBACTAM 3.375 G IVPB
3.3750 g | Freq: Three times a day (TID) | INTRAVENOUS | Status: DC
  Administered 2015-05-29 – 2015-06-02 (×10): 3.375 g via INTRAVENOUS
  Filled 2015-05-29 (×14): qty 50

## 2015-05-29 MED ORDER — OXYCODONE HCL 5 MG PO TABS: 5.0000 mg | ORAL_TABLET | ORAL | Status: DC | PRN

## 2015-05-29 MED ORDER — ACETAMINOPHEN 325 MG PO TABS
650.0000 mg | ORAL_TABLET | Freq: Four times a day (QID) | ORAL | Status: DC | PRN
  Administered 2015-06-08: 650 mg via ORAL
  Filled 2015-05-29: qty 2

## 2015-05-29 MED ORDER — COLLAGENASE 250 UNIT/GM EX OINT
TOPICAL_OINTMENT | Freq: Every day | CUTANEOUS | Status: DC
  Administered 2015-05-30 – 2015-06-04 (×6): via TOPICAL
  Filled 2015-05-29: qty 30

## 2015-05-29 MED ORDER — SODIUM CHLORIDE 0.9% FLUSH
3.0000 mL | Freq: Two times a day (BID) | INTRAVENOUS | Status: DC
  Administered 2015-05-29 – 2015-06-08 (×18): 3 mL via INTRAVENOUS

## 2015-05-29 MED ORDER — ASPIRIN 81 MG PO CHEW
CHEWABLE_TABLET | ORAL | Status: AC
Start: 2015-05-29 — End: 2015-05-30
  Filled 2015-05-29: qty 1

## 2015-05-29 MED ORDER — VANCOMYCIN HCL IN DEXTROSE 1-5 GM/200ML-% IV SOLN
1000.0000 mg | Freq: Once | INTRAVENOUS | Status: AC
  Administered 2015-05-29: 1000 mg via INTRAVENOUS
  Filled 2015-05-29: qty 200

## 2015-05-29 MED ORDER — FUROSEMIDE 10 MG/ML IJ SOLN
20.0000 mg | Freq: Once | INTRAMUSCULAR | Status: AC
  Administered 2015-05-29: 20 mg via INTRAVENOUS
  Filled 2015-05-29: qty 4

## 2015-05-29 MED ORDER — SENNOSIDES-DOCUSATE SODIUM 8.6-50 MG PO TABS: 1.0000 | ORAL_TABLET | Freq: Every day | ORAL | Status: DC

## 2015-05-29 MED ORDER — ATORVASTATIN CALCIUM 20 MG PO TABS: 80.0000 mg | ORAL_TABLET | Freq: Every day | ORAL | Status: DC

## 2015-05-29 MED ORDER — NITROGLYCERIN 0.4 MG SL SUBL: 0.4000 mg | SUBLINGUAL_TABLET | SUBLINGUAL | Status: DC | PRN

## 2015-05-29 MED ORDER — MUPIROCIN 2 % EX OINT
TOPICAL_OINTMENT | Freq: Two times a day (BID) | CUTANEOUS | Status: DC
  Administered 2015-05-29 – 2015-05-30 (×3): via NASAL
  Filled 2015-05-29: qty 22

## 2015-05-29 MED ORDER — ATORVASTATIN CALCIUM 20 MG PO TABS
80.0000 mg | ORAL_TABLET | Freq: Every day | ORAL | Status: DC
  Administered 2015-05-29 – 2015-05-30 (×2): 80 mg via ORAL
  Filled 2015-05-29 (×2): qty 4

## 2015-05-29 NOTE — ED Notes (Signed)
[facility] reports home health nurse called when found pt. Slumped over and disoriented upon arrival. [facility] Has dressed wound to Lt. Foot, edema present in lower extremities.

## 2015-05-29 NOTE — Progress Notes (Signed)
Pt with decreased LOC. Admission profile not completed at this time due to same.

## 2015-05-29 NOTE — ED Notes (Signed)
Edema and cellulitis present in bilateral lower extremities. Lt. Foot wound dressing removed. 3 wounds present on Lt. Foot.

## 2015-05-29 NOTE — Progress Notes (Signed)
ANTICOAGULATION CONSULT NOTE - Initial Consult  Pharmacy Consult for Heparin Indication: chest pain/ACS  No Known Allergies  Patient Measurements: Height: 5\' 8"  (172.7 cm) Weight: 186 lb 14.4 oz (84.777 kg) IBW/kg (Calculated) : 68.4 Heparin Dosing Weight: 84.8 kg  Vital Signs: Temp: 100.2 F (37.9 C) (05/11 1155) Temp Source: Oral (05/11 1155) BP: 126/72 mmHg (05/11 1400) Pulse Rate: 103 (05/11 1400)  Labs:  Recent Labs  02/04/2015 1210  HGB 9.2*  HCT 28.7*  PLT 215  CREATININE 1.63*  TROPONINI 1.85*    Estimated Creatinine Clearance: 47.3 mL/min (by C-G formula based on Cr of 1.63).   Medical History: Past Medical History  Diagnosis Date  . Diabetes mellitus with neuropathy (HCC)   . Benign essential HTN   . PVD (peripheral vascular disease) (HCC)     Medications:   (Not in a hospital admission) Scheduled:  . acetaminophen      . heparin  4,000 Units Intravenous Once   Infusions:  . heparin      Assessment: 65 y/o M admitted with cellulitis and NSTEMI. Patient reports taking Plavix but no blood thinners.   Goal of Therapy:  Heparin level 0.3-0.7 units/ml Monitor platelets by anticoagulation protocol: Yes   Plan:  Give 4000 units bolus x 1 Start heparin infusion at 1100 units/hr Check anti-Xa level in 6 hours and daily while on heparin Continue to monitor H&H and platelets  Luisa HartChristy, Flavio Lindroth D 07-03-15,3:04 PM

## 2015-05-29 NOTE — ED Provider Notes (Addendum)
[facility] Emergency Department Provider Note   ____________________________________________  Time seen: I have reviewed the triage vital signs and the triage nursing note.  HISTORY  Chief Complaint Altered Mental Status   Historian Limited, altered mental status/patient's poor historian  HPI Johnny Lane is a 65 y.o. male is here for evaluation of confusion altered mental status. Patient had been at a nursing home until about 2 months ago when he was apparently discharged home by himself. Patient himself is confused.  Patient has chronic bilateral lower sure he swelling and foot wounds, specifically the left lower extremity. A home health nurse evaluated the patient today and found to be confused and sentences ED for further evaluation. He was found to be febrile.     Past Medical History  Diagnosis Date  . Diabetes mellitus with neuropathy (HCC)   . Benign essential HTN   . PVD (peripheral vascular disease) [facility])     Patient Active Problem List   Diagnosis Date Noted  . Pressure ulcer 01/11/2015  . Cellulitis 01/08/2015    Past Surgical History  Procedure Laterality Date  . Peripheral vascular catheterization  01/09/2015    Procedure: Lower Extremity Intervention;  Surgeon: Annice Needy, MD;  Location: [facility] INVASIVE CV LAB;  Service: Cardiovascular;;  . Peripheral vascular catheterization N/A 01/09/2015    Procedure: Abdominal Aortogram w/Lower Extremity;  Surgeon: Annice Needy, MD;  Location: [facility] INVASIVE CV LAB;  Service: Cardiovascular;  Laterality: N/A;    Current Outpatient Rx  Name  Route  Sig  Dispense  Refill  . albuterol (ACCUNEB) 0.63 MG/3ML nebulizer solution   Inhalation   Inhale 3 mLs into the lungs every 6 (six) hours as needed for wheezing or shortness of breath.          Marland Kitchen aspirin EC 81 MG tablet   Oral   Take 81 mg by mouth daily.         Marland Kitchen atorvastatin (LIPITOR) 80 MG tablet   Oral   Take 80 mg by mouth at  bedtime.         . clopidogrel (PLAVIX) 75 MG tablet   Oral   Take 75 mg by mouth daily.         . collagenase (SANTYL) ointment   Topical   Apply topically daily.   15 g   0   . insulin glargine (LANTUS) 100 UNIT/ML injection   Subcutaneous   Inject 60 Units into the skin at bedtime.         . piperacillin-tazobactam (ZOSYN) 3.375 GM/50ML IVPB   Intravenous   Inject 50 mLs (3.375 g total) into the vein every 8 (eight) hours.   500 mL   0   . senna-docusate (SENOKOT-S) 8.6-50 MG tablet   Oral   Take 1 tablet by mouth daily.           Allergies Review of patient's allergies indicates no known allergies.  History reviewed. No pertinent family history.  Social History Social History  Substance Use Topics  . Smoking status: Former Games developer  . Smokeless tobacco: None  . Alcohol Use: None    Review of Systems Patient denies most review of systems, but he is a very poor historian Constitutional: Positive for fever. Eyes: Negative for visual changes. ENT: Negative for sore throat. Cardiovascular: Negative for chest pain. Respiratory: Negative for shortness of breath. Gastrointestinal: Negative for abdominal pain, vomiting and diarrhea. Genitourinary: Negative for dysuria. Musculoskeletal: Negative for back pain. Skin: Bilateral leg swelling  Neurological: Negative for headache. 10 point Review of Systems otherwise negative ____________________________________________   PHYSICAL EXAM:  VITAL SIGNS: ED Triage Vitals  Enc Vitals Group     BP 06/09/2015 1155 133/75 mmHg     Pulse Rate 05/26/2015 1155 108     Resp 05/30/2015 1155 26     Temp 06/09/2015 1155 100.2 F (37.9 C)     Temp Source 05/19/2015 1155 Oral     SpO2 06/03/2015 1151 91 %     Weight 05/19/2015 1155 186 lb 14.4 oz (84.777 kg)     Height --      Head Cir --      Peak Flow --      Pain Score --      Pain Loc --      Pain Edu? --      Excl. in GC? --      Constitutional: Alert But somewhat  unhelpful in being able to present his history. No acute distress, but he is breathing somewhat rapidly.  HEENT   Head: Normocephalic and atraumatic.      Eyes: Conjunctivae are normal. PERRL. Normal extraocular movements.      Ears:         Nose: No congestion/rhinnorhea.   Mouth/Throat: Mucous membranes are moist.   Neck: No stridor. Cardiovascular/Chest:  Tachycardic, regular rhythm.  No murmurs, rubs, or gallops. Respiratory:  Occasional increased respiratory rate, but no retractions. Mild rhonchi throughout without wheezes or rales. Gastrointestinal: Soft. No distention, no guarding, no rebound. Nontender.    Genitourinary/rectal:Deferred Musculoskeletal:  Severely edematous bilateral lower extremities with redness and some skin blistering. The left foot is horribly swollen and pale, but still warm and pink as opposed is cyanotic. There are multiple deep ulcers to the heel and foot. Neurologic:  Confusion but no apparent facial droop or focal weakness or numbness. Skin:  Bilateral lower sure he cellulitis with severely swollen and chronic wounds to the left foot.  ____________________________________________   EKG I, Governor Rooksebecca Issaac Shipper, MD, the attending physician have personally viewed and interpreted all ECGs.  Sinus tachycardia. Left bundle branch block. ST depression laterally.  Review of prior EKG, somewhat similar morphology to the QRS complex. ____________________________________________  LABS (pertinent positives/negatives)  comprehensive metabolic panel significant for BUN 47 and creatinine 1.63, glucose 357 and mildly elevated LFTs without elevated bilirubin Lactate 2.2 White blood count 19.7, hemoglobin 9.2 and platelet count 215 Urinalysis pending troponin 1.85  ____________________________________________  RADIOLOGY All Xrays were viewed by me. Imaging interpreted by Radiologist.  Chest portable 1 view:IMPRESSION: Central vascular congestion without  convincing pulmonary edema. Small to moderate bilateral pleural effusion with bilateral basilar atelectasis or infiltrate.  Left foot: IMPRESSION: New soft tissue defects over the calcaneal tuberosity and over the plantar surface of the midfoot with no findings to suggest osteomyelitis. As seen on previous MRI, extensive soft tissue defects over the medial aspect of the great toe without evidence of osteomyelitis. Please note that MRI would be more sensitive for early findings of osteomyelitis. __________________________________________  PROCEDURES  Procedure(s) performed: None  Critical Care performed: CRITICAL CARE Performed by: Governor RooksLORD, Kaianna Dolezal   Total critical care time: 45 minutes  Critical care time was exclusive of separately billable procedures and treating other patients.  Critical care was necessary to treat or prevent imminent or life-threatening deterioration.  Critical care was time spent personally by me on the following activities: development of treatment plan with patient and/or surrogate as well as nursing, discussions with consultants, evaluation of patient's  response to treatment, examination of patient, obtaining history from patient or surrogate, ordering and performing treatments and interventions, ordering and review of laboratory studies, ordering and review of radiographic studies, pulse oximetry and re-evaluation of patient's condition.   ____________________________________________   ED COURSE / ASSESSMENT AND PLAN  Pertinent labs & imaging results that were available during my care of the patient were reviewed by me and considered in my medical decision making (see chart for details).  Patient febrile with tachycardia and probable cellulitis to bilateral x-rays, left greater than right, concerning for sepsis. Sepsis protocol was initiated. Patient states he has a history of congestive heart failure, and so because of this 30 cc/kg bolus was not  initiated per protocol. Instead 1 L normal saline was initiated, and I will follow clinical vital signs and respiratory status. He's had no hypotension.   Make mycin and Zosyn were chosen for antibiotic coverage. I did discuss this case with the hospitalist that patient may likely need stepdown placement.  Urinalysis and x-ray of the left foot were added on. These are pending at time of hospitalist admission.  At 250 I did review the troponin is elevated and patient will be started on aspirin and heparin.  I also reviewed the x-ray, no change in management based on this.  CONSULTATIONS:   Hospitalist for admission, Dr.Gouru.   Patient / Family / Caregiver informed of clinical course, medical decision-making process, and agree with plan.  ___________________________________________   FINAL CLINICAL IMPRESSION(S) / ED DIAGNOSES   Final diagnoses:  Cellulitis of left lower extremity  Cellulitis of right lower extremity  Sepsis, due to unspecified organism [facility])  NSTEMI (non-ST elevated myocardial infarction) [facility])              Note: This dictation was prepared with Dragon dictation. Any transcriptional errors that result from this process are unintentional   Governor Rooks, MD 06/03/2015 1400  Governor Rooks, MD 05/28/2015 1451

## 2015-05-29 NOTE — H&P (Signed)
[facility] - [place] at [facility]   PATIENT NAME: Crystal Ellwood    MR#:  [id number]  DATE OF BIRTH:  May 24, 1950  DATE OF ADMISSION:  06/09/2015  PRIMARY CARE PHYSICIAN: Leotis Shames, MD   REQUESTING/REFERRING PHYSICIAN: Dr. Shaune Pollack  CHIEF COMPLAINT:  Altered mental status  HISTORY OF PRESENT ILLNESS:  Randeep Biondolillo  is a 65 y.o. male with a known history of Diabetes mellitus, hypertension and peripheral vascular disease is presenting to the ED with a chief complaint of worsening of his mental status from his baseline. Patient has chronic lower extremity ulcers, had debridement recently by podiatry, following that patient was seen by home health care RN for wound care, who has stopped coming for the past 2 weeks and patient's wounds have gotten worse and he is brought into the ED for further evaluation. Patient being historian and history is obtained from the sister at bedside. Patient was seen by Dr. Ether Griffins few months ago and also sees Dr. Wyn Quaker as an outpatient  PAST MEDICAL HISTORY:   Past Medical History  Diagnosis Date  . Diabetes mellitus with neuropathy (HCC)   . Benign essential HTN   . PVD (peripheral vascular disease) (HCC)     PAST SURGICAL HISTOIRY:   Past Surgical History  Procedure Laterality Date  . Peripheral vascular catheterization  01/09/2015    Procedure: Lower Extremity Intervention;  Surgeon: Annice Needy, MD;  Location: [facility] INVASIVE CV LAB;  Service: Cardiovascular;;  . Peripheral vascular catheterization N/A 01/09/2015    Procedure: Abdominal Aortogram w/Lower Extremity;  Surgeon: Annice Needy, MD;  Location: [facility] INVASIVE CV LAB;  Service: Cardiovascular;  Laterality: N/A;    SOCIAL HISTORY:   Social History  Substance Use Topics  . Smoking status: Former Games developer  . Smokeless tobacco: Not on file  . Alcohol Use: Not on file    FAMILY HISTORY:  Family history of diabetes according to the sister  DRUG ALLERGIES:  No Known  Allergies  REVIEW OF SYSTEMS:  Review of system unobtainable as the patient is with altered mental status MEDICATIONS AT HOME:   Prior to Admission medications   Medication Sig Start Date End Date Taking? Authorizing Provider  albuterol (ACCUNEB) 0.63 MG/3ML nebulizer solution Inhale 3 mLs into the lungs every 6 (six) hours as needed for wheezing or shortness of breath.     Historical Provider, MD  aspirin EC 81 MG tablet Take 81 mg by mouth daily.    Historical Provider, MD  atorvastatin (LIPITOR) 80 MG tablet Take 80 mg by mouth at bedtime.    Historical Provider, MD  clopidogrel (PLAVIX) 75 MG tablet Take 75 mg by mouth daily.    Historical Provider, MD  collagenase (SANTYL) ointment Apply topically daily. 01/14/15   Shaune Pollack, MD  insulin glargine (LANTUS) 100 UNIT/ML injection Inject 60 Units into the skin at bedtime.    Historical Provider, MD  piperacillin-tazobactam (ZOSYN) 3.375 GM/50ML IVPB Inject 50 mLs (3.375 g total) into the vein every 8 (eight) hours. 01/14/15   Shaune Pollack, MD  senna-docusate (SENOKOT-S) 8.6-50 MG tablet Take 1 tablet by mouth daily.    Historical Provider, MD      VITAL SIGNS:  Blood pressure 126/72, pulse 103, temperature 100.2 F (37.9 C), temperature source Oral, resp. rate 30, height 5\' 8"  (1.727 m), weight 84.777 kg (186 lb 14.4 oz), SpO2 92 %.  PHYSICAL EXAMINATION:  GENERAL:  65 y.o.-year-old patient lying in the bed with no acute distress.  EYES: Pupils  equal, round, reactive to light and accommodation. No scleral icterus. Extraocular muscles intact.  HEENT: Head atraumatic, normocephalic. Oropharynx and nasopharynx clear.  NECK:  Supple, no jugular venous distention. No thyroid enlargement, no tenderness.  LUNGS: Normal breath sounds bilaterally And diminished at the bases, no wheezing, rales,rhonchi or crepitation. No use of accessory muscles of respiration.  CARDIOVASCULAR: S1, S2 normal. No murmurs, rubs, or gallops.  ABDOMEN: Soft, nontender,  nondistended. Bowel sounds present. No organomegaly or mass.  EXTREMITIES: Left lower extremity with a 3 ulcers on the foot. One of the left heel with black  Eschar, 1 wound on the medial aspect of the plantar area with purulent discharge and left medial aspect of the great toe with another ulcer and purulent discharge. Right lower extremity is edematous and some cyanotic patches were noticed with diminished pulses and toes look macerated.  NEUROLOGIC: Patient is awake and alert but disoriented PSYCHIATRIC: The patient is alert and disoriented SKIN: No obvious rash, lesion  LABORATORY PANEL:   CBC  Recent Labs Lab 05/28/2015 1210  WBC 19.7*  HGB 9.2*  HCT 28.7*  PLT 215   ------------------------------------------------------------------------------------------------------------------  Chemistries   Recent Labs Lab 06/16/2015 1210  NA 137  K 4.6  CL 99*  CO2 30  GLUCOSE 357*  BUN 47*  CREATININE 1.63*  CALCIUM 8.4*  AST 143*  ALT 77*  ALKPHOS 55  BILITOT 1.1   ------------------------------------------------------------------------------------------------------------------  Cardiac Enzymes  Recent Labs Lab 06/07/2015 1210  TROPONINI 1.85*   ------------------------------------------------------------------------------------------------------------------  RADIOLOGY:  Dg Chest Portable 1 View  05/26/2015  CLINICAL DATA:  Edema and cellulitis bilateral lower extremity, possible sepsis EXAM: PORTABLE CHEST 1 VIEW COMPARISON:  01/13/2015 FINDINGS: Cardiomegaly. Central mild vascular congestion without convincing pulmonary edema. Small to moderate bilateral pleural effusion with bilateral basilar atelectasis or infiltrate. IMPRESSION: Central vascular congestion without convincing pulmonary edema. Small to moderate bilateral pleural effusion with bilateral basilar atelectasis or infiltrate. Electronically Signed   By: Natasha Mead M.D.   On: 05/28/2015 12:58   Dg Foot Complete  Left  05/20/2015  CLINICAL DATA:  Multiple open wounds left foot evaluate for osteomyelitis EXAM: LEFT FOOT - COMPLETE 3+ VIEW COMPARISON:  01/14/2015 FINDINGS: There is pronounced tissue loss over the medial aspect of the great toe from the level of the metatarsal phalangeal joints to the interphalangeal joint. This is similar to the prior study. There is no periosteal reaction or cortical destruction along this region. There is another deep soft tissue ulcer projecting over the base of the first metatarsal along the sole. No evidence of osseous involvement seen in this area. There is significant soft tissue defect over the calcaneal tuberosity. The calcaneal tuberosity demonstrates cortical thickening. This area was not included on previous MRI. Previous radiograph 01/08/2015 shows similar calcaneal tuberosity cortical thickening. There is extensive soft tissue swelling throughout the foot. There is extensive small vessel calcification. This portion of anatomy is not included on the previous MRI. IMPRESSION: New soft tissue defects over the calcaneal tuberosity and over the plantar surface of the midfoot with no findings to suggest osteomyelitis. As seen on previous MRI, extensive soft tissue defects over the medial aspect of the great toe without evidence of osteomyelitis. Please note that MRI would be more sensitive for early findings of osteomyelitis. Electronically Signed   By: Esperanza Heir M.D.   On: 06/04/2015 14:05    EKG:   Orders placed or performed during the hospital encounter of 01/08/15  . EKG 12-Lead  . EKG 12-Lead  IMPRESSION AND PLAN:   Winfield RastDavid Chalker  is a 65 y.o. male with a known history of Diabetes mellitus, hypertension and peripheral vascular disease is presenting to the ED with a chief complaint of worsening of his mental status from his baseline. Patient has chronic lower extremity ulcers, had debridement recently by podiatry, following that patient was seen by home health  care RN for wound care, who has stopped coming for the past 2 weeks and patient's wounds have gotten worse and he is brought into the ED for further evaluation.  #Sepsis meets criteria with leukocytosis, fever and tachycardia-most likely from the infected leg wounds Blood cultures, urine cultures and wound culture and sensitivity is ordered Empiric antibiotics vancomycin and Zosyn are started following cultures Consult is placed to podiatry, vascular surgery. Discussed with Dr. Graciela HusbandsKlein Bilateral lower extending to venous Dopplers to rule out DVT  #Non-STEMI troponin at 1.85 Patient is a poor historian Cycle troponins and patient is started on heparin drip after bolus On call cardiology Dr. Lady Garyfath called and notified. He is aware and agreeable with the current plan of care Continue aspirin, Plavix and statin and check fasting lipid panel. Continue home medication Lipitor 80 mg by mouth once daily Provided beta blocker  # independent diabetes mellitus Patient is currently nothing by mouth Will provide only 20 units of Lantus at bedtime current is currently patient is nothing by mouth. Patient takes 60 units at home sliding scale sliding scale insulin and check hemoglobin A1c in a.m.  #Chronic history of congestive heart failure Not fluid overloaded at this time Monitor intake and output and daily weights. Echocardiogram and cardiology consult is placed to Dr.Fath    All the records are reviewed and case discussed with ED provider. Management plans discussed with the patient, sister they are  in agreement.  CODE STATUS: Full code/healthcare power of attorney Matt-phone #[phone]  TOTAL Critical care TIME TAKING CARE OF THIS PATIENT: 50 minutes.    Ramonita LabGouru, Grover Woodfield M.D on 05/19/2015 at 3:15 PM  Between 7am to 6pm - Pager - [phone]  After 6pm go to [email or web address] - password [email or web address] [facility]  [facility]  Office  [phone]  CC: Primary care physician; Leotis ShamesSingh,[name],  MD

## 2015-05-29 NOTE — Consult Note (Signed)
[facility]  CARDIOLOGY CONSULT NOTE  Patient ID: Johnny Lane MRN: [id number] DOB/AGE: 67/02/1950 67 y.o.  Admit date: 05/28/2015 Referring Physician Dr. Amado Lane Primary Physician Dr. Leotis ShamesJasmine Lane Primary Cardiologist none Reason for Consultation Abnormal troponin  HPI: Patient is a 65 year old male with history of multiple medical problems including diabetes mellitus, hypertension, S peripheral vascular disease with chronic lower extremity ulcers status post recent debridement by podiatry who was being followed and home health for wound care. Home health was stopped coming for the past 2 weeks and the patient's lower extremity was again worse. He was brought to the emergency room for further evaluation due to worsening of his mental status from baseline. Not able to give much history but denies any pain. He was noted to have a serum troponin of 1.318 cardiac consultation was called. Patient was placed on IV heparin. He was noted to have acute on chronic renal insufficiency with a serum creatinine of 1.63. Creatinine was 0.882 months ago and 2.91  4 months ago. EKG revealed sinus rhythm with LVH interventricular conduction delay. This is unchanged from previous electrocardiogram done in 2015. Lactate was 2.2. White count was 19.7. Urinalysis showed too numerous to count white blood cells. Urine culture is sent process.  Review of Systems  Unable to perform ROS: mental acuity    Past Medical History  Diagnosis Date  . Diabetes mellitus with neuropathy (HCC)   . Benign essential HTN   . PVD (peripheral vascular disease) (HCC)     History reviewed. No pertinent family history.  Social History   Social History  . Marital Status: Single    Spouse Name: N/A  . Number of Children: N/A  . Years of Education: N/A   Occupational History  . Not on file.   Social History Main Topics  . Smoking status: Former Games developermoker  . Smokeless tobacco: Not on file  .  Alcohol Use: Not on file  . Drug Use: Not on file  . Sexual Activity: Not on file   Other Topics Concern  . Not on file   Social History Narrative    Past Surgical History  Procedure Laterality Date  . Peripheral vascular catheterization  01/09/2015    Procedure: Lower Extremity Intervention;  Surgeon: Annice NeedyJason S Dew, MD;  [facility];  Service: Cardiovascular;;  . Peripheral vascular catheterization N/A 01/09/2015    Procedure: Abdominal Aortogram w/Lower Extremity;  Surgeon: Annice NeedyJason S Dew, MD;  Location: [facility] INVASIVE CV LAB;  Service: Cardiovascular;  Laterality: N/A;      (Not in a hospital admission)  Physical Exam: Blood pressure 122/67, pulse 97, temperature 100.5 F (38.1 C), temperature source Oral, resp. rate 25, height 5\' 8"  (1.727 m), weight 84.777 kg (186 lb 14.4 oz), SpO2 93 %.   Wt Readings from Last 1 Encounters:  06/12/2015 84.777 kg (186 lb 14.4 oz)     General appearance: slowed mentation Resp: diminished breath sounds bilaterally Cardio: regular rate and rhythm GI: soft, non-tender; bowel sounds normal; no masses,  no organomegaly Extremities: Bilateral lower extremity ulcerations Neurologic: Mental status: Reduced mental status with poor history.  Labs:   Lab Results  Component Value Date   WBC 19.7* 05/21/2015   HGB 9.2* 06/14/2015   HCT 28.7* 05/26/2015   MCV 85.6 06/07/2015   PLT 215 05/30/2015    Recent Labs Lab 06/12/2015 1210  NA 137  K 4.6  CL 99*  CO2 30  BUN 47*  CREATININE 1.63*  CALCIUM 8.4*  PROT 7.7  BILITOT 1.1  ALKPHOS 55  ALT 77*  AST 143*  GLUCOSE 357*   Lab Results  Component Value Date   TROPONINI 1.85* 06/18/2015      Radiology: Chest x-ray shows central vascular congestion without pulmonary edema. EKG: Sinus rhythm with interventricular conduction delay consistent with a bundle branch block unchanged from baseline  ASSESSMENT AND PLAN: 65 year old male with severe peripheral vascular disease,  diabetes who was admitted after developing altered mental status. He was felt to have urosepsis after urinalysis showed too numerous to count white blood cells. He has an elevated white blood cell count. He has acute on chronic renal insufficiency. His serum troponin is mildly elevated. EKG is not changed from baseline but bundle branch block makes it difficult to assess. Chest x-ray shows no  high-grade pulmonary edema. We'll continue heparin overnight. Does not appear to be a candidate for invasive evaluation. We'll make further recommendations after further laboratory evaluation his course. Signed: Dalia Heading MD, [facility] 05/20/2015, 5:17 PM

## 2015-05-29 NOTE — Progress Notes (Signed)
ANTICOAGULATION CONSULT NOTE - Initial Consult  Pharmacy Consult for Heparin Indication: chest pain/ACS  No Known Allergies  Patient Measurements: Height: 5\' 10"  (177.8 cm) Weight: 185 lb 1.6 oz (83.961 kg) IBW/kg (Calculated) : 73 Heparin Dosing Weight: 84.8 kg  Vital Signs: Temp: 98.4 F (36.9 C) (05/11 1951) Temp Source: Oral (05/11 1951) BP: 121/62 mmHg (05/11 1951) Pulse Rate: 76 (05/11 1951)  Labs:  Recent Labs  05/28/2015 1210 05/27/2015 2222  HGB 9.2*  --   HCT 28.7*  --   PLT 215  --   APTT 37*  --   LABPROT 15.6*  --   INR 1.22  --   HEPARINUNFRC  --  0.11*  CREATININE 1.63*  --   TROPONINI 1.85*  --     Estimated Creatinine Clearance: 46 mL/min (by C-G formula based on Cr of 1.63).   Medical History: Past Medical History  Diagnosis Date  . Diabetes mellitus with neuropathy (HCC)   . Benign essential HTN   . PVD (peripheral vascular disease) (HCC)     Medications:  Prescriptions prior to admission  Medication Sig Dispense Refill Last Dose  . albuterol (ACCUNEB) 0.63 MG/3ML nebulizer solution Inhale 3 mLs into the lungs every 6 (six) hours as needed for wheezing or shortness of breath.    PRN  . aspirin EC 81 MG tablet Take 81 mg by mouth daily.   01/07/2015 at am  . atorvastatin (LIPITOR) 80 MG tablet Take 80 mg by mouth at bedtime.   01/06/2015 at pm  . clopidogrel (PLAVIX) 75 MG tablet Take 75 mg by mouth daily.   01/07/2015 at am  . collagenase (SANTYL) ointment Apply topically daily. 15 g 0   . insulin glargine (LANTUS) 100 UNIT/ML injection Inject 60 Units into the skin at bedtime.   01/07/2015 at pm  . piperacillin-tazobactam (ZOSYN) 3.375 GM/50ML IVPB Inject 50 mLs (3.375 g total) into the vein every 8 (eight) hours. 500 mL 0   . senna-docusate (SENOKOT-S) 8.6-50 MG tablet Take 1 tablet by mouth daily.   01/07/2015 at pm   Scheduled:  . atorvastatin  80 mg Oral QHS  . clopidogrel  75 mg Oral Daily  . collagenase   Topical Daily  . insulin  glargine  20 Units Subcutaneous QHS  . mupirocin ointment   Nasal BID  . piperacillin-tazobactam  3.375 g Intravenous Q8H  . senna-docusate  1 tablet Oral Daily  . sodium chloride flush  3 mL Intravenous Q12H   Infusions:  . sodium chloride 75 mL/hr at 05/23/2015 1735  . heparin 1,100 Units/hr (06/14/2015 1605)    Assessment: 65 y/o M admitted with cellulitis and NSTEMI. Patient reports taking Plavix but no blood thinners.   5/11 2230 HL resulted at 0.11  Goal of Therapy:  Heparin level 0.3-0.7 units/ml Monitor platelets by anticoagulation protocol: Yes   Plan:  Will bolus with Heparin 2500 units IV and increase rate to 1350 units/hr. Will recheck next HL in 6hr.  Clovia CuffLisa Adaline Trejos, PharmD, BCPS 06/02/2015 11:26 PM

## 2015-05-29 NOTE — Consult Note (Signed)
[facility] Vascular Consult Note  MRN : [id number]  Johnny Lane is a 65 y.o. (1950-05-22) male who presents with chief complaint of  Chief Complaint  Patient presents with  . Altered Mental Status  .  History of Present Illness: I am asked to evaluate the patient by Dr. Amado Coe.  He is a 65 y.o. male with a known history of Diabetes mellitus, hypertension and peripheral vascular disease is presenting to the ED with a chief complaint of worsening of his mental status from his baseline. Patient has chronic lower extremity ulcers, had debridement recently by podiatry, following that patient was seen by home health care RN for wound care, who has stopped coming for the past 2 weeks and patient's wounds have gotten worse and he is brought into the ED for further evaluation.  Patient has been seen by Dr. Wyn Quaker in the past and is status post lower extremity angiography and intervention in December 2016.  Current Facility-Administered Medications  Medication Dose Route Frequency Provider Last Rate Last Dose  . 0.9 %  sodium chloride infusion   Intravenous Continuous Ramonita Lab, MD 75 mL/hr at Jun 17, 2015 1735    . acetaminophen (TYLENOL) tablet 650 mg  650 mg Oral Q6H PRN Ramonita Lab, MD       Or  . acetaminophen (TYLENOL) suppository 650 mg  650 mg Rectal Q6H PRN Aruna Gouru, MD      . albuterol (PROVENTIL) (2.5 MG/3ML) 0.083% nebulizer solution 1.25 mg  1.25 mg Nebulization Q6H PRN Ramonita Lab, MD      . atorvastatin (LIPITOR) tablet 80 mg  80 mg Oral QHS Aruna Gouru, MD      . clopidogrel (PLAVIX) tablet 75 mg  75 mg Oral Daily Ramonita Lab, MD   75 mg at 06-17-2015 1736  . collagenase (SANTYL) ointment   Topical Daily Aruna Gouru, MD      . heparin ADULT infusion 100 units/mL (25000 units/250 mL)  1,100 Units/hr Intravenous Continuous Governor Rooks, MD 11 mL/hr at Jun 17, 2015 1605 1,100 Units/hr at 17-Jun-2015 1605  . insulin glargine (LANTUS) injection 20 Units  20 Units Subcutaneous  QHS Aruna Gouru, MD      . morphine 2 MG/ML injection 2 mg  2 mg Intravenous Q4H PRN Ramonita Lab, MD      . nitroGLYCERIN (NITROSTAT) SL tablet 0.4 mg  0.4 mg Sublingual Q5 min PRN Ramonita Lab, MD      . oxyCODONE (Oxy IR/ROXICODONE) immediate release tablet 5 mg  5 mg Oral Q4H PRN Aruna Gouru, MD      . piperacillin-tazobactam (ZOSYN) IVPB 3.375 g  3.375 g Intravenous Q8H Aruna Gouru, MD      . senna-docusate (Senokot-S) tablet 1 tablet  1 tablet Oral Daily Ramonita Lab, MD   1 tablet at Jun 17, 2015 1736  . sodium chloride flush (NS) 0.9 % injection 3 mL  3 mL Intravenous Q12H Ramonita Lab, MD        Past Medical History  Diagnosis Date  . Diabetes mellitus with neuropathy (HCC)   . Benign essential HTN   . PVD (peripheral vascular disease) [facility])     Past Surgical History  Procedure Laterality Date  . Peripheral vascular catheterization  01/09/2015    Procedure: Lower Extremity Intervention;  Surgeon: Annice Needy, MD;  Location: [facility] INVASIVE CV LAB;  Service: Cardiovascular;;  . Peripheral vascular catheterization N/A 01/09/2015    Procedure: Abdominal Aortogram w/Lower Extremity;  Surgeon: Annice Needy, MD;  Location: [facility] INVASIVE CV  LAB;  Service: Cardiovascular;  Laterality: N/A;    Social History Social History  Substance Use Topics  . Smoking status: Former Games developermoker  . Smokeless tobacco: None  . Alcohol Use: None    Family History History reviewed. No pertinent family history. No family history of bleeding clotting disorders, porphyria or autoimmune disease  No Known Allergies   REVIEW OF SYSTEMS (Negative unless checked)  Constitutional: [] Weight loss  [] Fever  [x] Chills Cardiac: [] Chest pain   [] Chest pressure   [] Palpitations   [] Shortness of breath when laying flat   [] Shortness of breath at rest   [] Shortness of breath with exertion. Vascular:  [] Pain in legs with walking   [x] Pain in legs at rest   [] Pain in legs when laying flat   [] Claudication   [] Pain in feet when  walking  [] Pain in feet at rest  [] Pain in feet when laying flat   [] History of DVT   [] Phlebitis   [x] Swelling in legs   [] Varicose veins   [x] Non-healing ulcers Pulmonary:   [] Uses home oxygen   [] Productive cough   [] Hemoptysis   [] Wheeze  [] COPD   [] Asthma Neurologic:  [] Dizziness  [] Blackouts   [] Seizures   [] History of stroke   [] History of TIA  [] Aphasia   [] Temporary blindness   [] Dysphagia   [] Weakness or numbness in arms   [] Weakness or numbness in legs Musculoskeletal:  [] Arthritis   [] Joint swelling   [] Joint pain   [] Low back pain Hematologic:  [] Easy bruising  [] Easy bleeding   [] Hypercoagulable state   [] Anemic  [] Hepatitis Gastrointestinal:  [] Blood in stool   [] Vomiting blood  [] Gastroesophageal reflux/heartburn   [] Difficulty swallowing. Genitourinary:  [] Chronic kidney disease   [] Difficult urination  [] Frequent urination  [] Burning with urination   [] Blood in urine Skin:  [x] Rashes   [x] Ulcers   [x] Wounds Psychological:  [] History of anxiety   []  History of major depression.  Physical Examination  Filed Vitals:   05/28/2015 1630 05/20/2015 1641 06/12/2015 1732 05/30/2015 1951  BP: 122/67  109/57 121/62  Pulse: 101 97 82 76  Temp: 100.8 F (38.2 C) 100.5 F (38.1 C) 98.3 F (36.8 C) 98.4 F (36.9 C)  TempSrc:   Axillary Oral  Resp: 27 25 26 18   Height:   5\' 10"  (1.778 m)   Weight:   83.961 kg (185 lb 1.6 oz)   SpO2: 94% 93% 93% 94%   Body mass index is 26.56 kg/(m^2). Gen:  WD/WN, NAD Head: [place]/AT, No temporalis wasting. Prominent temp pulse not noted. Ear/Nose/Throat: Hearing grossly intact, nares w/o erythema or drainage, oropharynx w/o Erythema/Exudate Eyes: PERRLA, EOMI.  Neck: Supple, no nuchal rigidity.  No bruit or JVD.  Pulmonary:  Good air movement, clear to auscultation bilaterally.  Cardiac: RRR, normal S1, S2, no Murmurs, rubs or gallops. Vascular: Massive chronic edema left leg worse than right. Multiple ulcers noted. There is marked erythema of both  legs. Vessel Right Left  Radial Palpable Palpable  Ulnar Palpable Palpable  Brachial Palpable Palpable  Carotid Palpable, without bruit Palpable, without bruit  Aorta Not palpable N/A  Femoral Trace Palpable Not Palpable  Popliteal 1+ Palpable Not Palpable  PT Not Palpable Not Palpable  DP Not Palpable Not Palpable   Gastrointestinal: soft, non-tender/non-distended. No guarding/reflex. No masses, surgical incisions, or scars. Musculoskeletal: M/S Week both lower extremities.  Massive edema. Neurologic: CN 2-12 intact. Pain and light touch intact in extremities.  Symmetrical.  Speech is Broken. Motor exam as listed above. Psychiatric: Judgment intact, Mood &  affect appropriate for pt's clinical situation. Dermatologic: Extensive rashes and ulcers noted.  + cellulitis with open wounds. Lymph : No Cervical, Axillary, or Inguinal lymphadenopathy.   CBC Lab Results  Component Value Date   WBC 19.7* 05/31/2015   HGB 9.2* 06/18/2015   HCT 28.7* 06/12/2015   MCV 85.6 05/30/2015   PLT 215 05/19/2015    BMET    Component Value Date/Time   NA 137 06/10/2015 1210   NA 140 12/25/2013 1136   K 4.6 05/28/2015 1210   K 3.9 12/25/2013 1136   CL 99* 06/09/2015 1210   CL 107 12/25/2013 1136   CO2 30 06/17/2015 1210   CO2 30 12/25/2013 1136   GLUCOSE 357* 05/19/2015 1210   GLUCOSE 269* 12/25/2013 1136   BUN 47* 06/09/2015 1210   BUN 21* 12/25/2013 1136   CREATININE 1.63* 06/02/2015 1210   CREATININE 0.71 12/25/2013 1136   CALCIUM 8.4* 05/19/2015 1210   CALCIUM 8.4* 12/25/2013 1136   GFRNONAA 42* 06/02/2015 1210   GFRNONAA >60 12/25/2013 1136   GFRAA 49* 05/19/2015 1210   GFRAA >60 12/25/2013 1136   Estimated Creatinine Clearance: 46 mL/min (by C-G formula based on Cr of 1.63).  COAG Lab Results  Component Value Date   INR 1.22 05/28/2015    Radiology Dg Chest Portable 1 View  05/31/2015  CLINICAL DATA:  Edema and cellulitis bilateral lower extremity, possible sepsis EXAM:  PORTABLE CHEST 1 VIEW COMPARISON:  01/13/2015 FINDINGS: Cardiomegaly. Central mild vascular congestion without convincing pulmonary edema. Small to moderate bilateral pleural effusion with bilateral basilar atelectasis or infiltrate. IMPRESSION: Central vascular congestion without convincing pulmonary edema. Small to moderate bilateral pleural effusion with bilateral basilar atelectasis or infiltrate. Electronically Signed   By: Natasha Mead M.D.   On: 05/21/2015 12:58   Dg Foot Complete Left  06/18/2015  CLINICAL DATA:  Multiple open wounds left foot evaluate for osteomyelitis EXAM: LEFT FOOT - COMPLETE 3+ VIEW COMPARISON:  01/14/2015 FINDINGS: There is pronounced tissue loss over the medial aspect of the great toe from the level of the metatarsal phalangeal joints to the interphalangeal joint. This is similar to the prior study. There is no periosteal reaction or cortical destruction along this region. There is another deep soft tissue ulcer projecting over the base of the first metatarsal along the sole. No evidence of osseous involvement seen in this area. There is significant soft tissue defect over the calcaneal tuberosity. The calcaneal tuberosity demonstrates cortical thickening. This area was not included on previous MRI. Previous radiograph 01/08/2015 shows similar calcaneal tuberosity cortical thickening. There is extensive soft tissue swelling throughout the foot. There is extensive small vessel calcification. This portion of anatomy is not included on the previous MRI. IMPRESSION: New soft tissue defects over the calcaneal tuberosity and over the plantar surface of the midfoot with no findings to suggest osteomyelitis. As seen on previous MRI, extensive soft tissue defects over the medial aspect of the great toe without evidence of osteomyelitis. Please note that MRI would be more sensitive for early findings of osteomyelitis. Electronically Signed   By: Esperanza Heir M.D.   On: 05/24/2015 14:05     Assessment/Plan 1.  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 The risks and benefits were reviewed with the patient all questions were answered he agrees to proceed and states above all else he wants everything done to prevent leg amputations. I will  discuss with Dr. Wyn Quaker and possibly he could be added to the schedule for Monday.  2.  Lower extremity cellulitis with Sepsis meets criteria with leukocytosis, fever and tachycardia-most likely from the infected leg wounds Blood cultures, urine cultures and wound culture and sensitivity is ordered Empiric antibiotics vancomycin and Zosyn are started following cultures. Bilateral lower extending to venous Dopplers to rule out DVT  3.  Non-STEMI troponin at 1.85 Patient is a poor historian Cycle troponins and patient is started on heparin drip after bolus On call cardiology Dr. Lady Gary called and notified. He is aware and agreeable with the current plan of care Continue aspirin, Plavix and statin and check fasting lipid panel. Continue home medication Lipitor 80 mg by mouth once daily Provided beta blocker  4.  Insulin diabetes mellitus Patient is currently nothing by mouth Will provide only 20 units of Lantus at bedtime current is currently patient is nothing by mouth. Patient takes 60 units at home sliding scale sliding scale insulin and check hemoglobin A1c in a.m.  5.  Chronic history of congestive heart failure Not fluid overloaded at this time Monitor intake and output and daily weights. Echocardiogram and cardiology consult is placed to Dr.Fath     Sham Alviar, Latina Craver, MD  05/31/2015 8:16 PM

## 2015-05-30 ENCOUNTER — Inpatient Hospital Stay: Payer: Medicare Other

## 2015-05-30 ENCOUNTER — Inpatient Hospital Stay
Admit: 2015-05-30 | Discharge: 2015-05-30 | Disposition: A | Discharge status: Home / Self Care | Payer: Medicare Other | Attending: Internal Medicine | Admitting: Internal Medicine

## 2015-05-30 LAB — COMPREHENSIVE METABOLIC PANEL
ALBUMIN: 2.3 g/dL — AB (ref 3.5–5.0)
ALT: 71 U/L — AB (ref 17–63)
AST: 64 U/L — AB (ref 15–41)
Alkaline Phosphatase: 49 U/L (ref 38–126)
Anion gap: 6 (ref 5–15)
BILIRUBIN TOTAL: 0.9 mg/dL (ref 0.3–1.2)
BUN: 54 mg/dL — AB (ref 6–20)
CHLORIDE: 103 mmol/L (ref 101–111)
CO2: 31 mmol/L (ref 22–32)
CREATININE: 1.42 mg/dL — AB (ref 0.61–1.24)
Calcium: 8.1 mg/dL — ABNORMAL LOW (ref 8.9–10.3)
GFR calc Af Amer: 58 mL/min — ABNORMAL LOW (ref >60)
GFR, EST NON AFRICAN AMERICAN: 50 mL/min — AB (ref >60)
GLUCOSE: 352 mg/dL — AB (ref 65–99)
POTASSIUM: 4.1 mmol/L (ref 3.5–5.1)
Sodium: 140 mmol/L (ref 135–145)
Total Protein: 7 g/dL (ref 6.5–8.1)

## 2015-05-30 LAB — GLUCOSE, CAPILLARY
GLUCOSE-CAPILLARY: 294 mg/dL — AB (ref 65–99)
Glucose-Capillary: 272 mg/dL — ABNORMAL HIGH (ref 65–99)

## 2015-05-30 LAB — CBC
HEMATOCRIT: 29.3 % — AB (ref 40.0–52.0)
Hemoglobin: 9.5 g/dL — ABNORMAL LOW (ref 13.0–18.0)
MCH: 28.2 pg (ref 26.0–34.0)
MCHC: 32.3 g/dL (ref 32.0–36.0)
MCV: 87.2 fL (ref 80.0–100.0)
Platelets: 193 10*3/uL (ref 150–440)
RBC: 3.35 MIL/uL — ABNORMAL LOW (ref 4.40–5.90)
RDW: 15.8 % — ABNORMAL HIGH (ref 11.5–14.5)
WBC: 21 10*3/uL — ABNORMAL HIGH (ref 3.8–10.6)

## 2015-05-30 LAB — LIPID PANEL
Cholesterol: 87 mg/dL (ref 0–200)
HDL: 22 mg/dL — ABNORMAL LOW (ref >40)
LDL CALC: 41 mg/dL (ref 0–99)
Total CHOL/HDL Ratio: 4 RATIO
Triglycerides: 121 mg/dL (ref <150)
VLDL: 24 mg/dL (ref 0–40)

## 2015-05-30 LAB — ECHOCARDIOGRAM COMPLETE
HEIGHTINCHES: 70 in
Weight: 2961.6 oz

## 2015-05-30 LAB — PROTIME-INR
INR: 1.08
PROTHROMBIN TIME: 14.2 s (ref 11.4–15.0)

## 2015-05-30 LAB — HEPARIN LEVEL (UNFRACTIONATED)
HEPARIN UNFRACTIONATED: 0.17 [IU]/mL — AB (ref 0.30–0.70)
HEPARIN UNFRACTIONATED: 0.47 [IU]/mL (ref 0.30–0.70)
Heparin Unfractionated: 0.15 IU/mL — ABNORMAL LOW (ref 0.30–0.70)

## 2015-05-30 LAB — HEMOGLOBIN A1C: HEMOGLOBIN A1C: 9.2 % — AB (ref 4.0–6.0)

## 2015-05-30 LAB — TROPONIN I: Troponin I: 0.65 ng/mL — ABNORMAL HIGH (ref <0.031)

## 2015-05-30 MED ORDER — HEPARIN BOLUS VIA INFUSION
2500.0000 [IU] | Freq: Once | INTRAVENOUS | Status: AC
Start: 2015-05-30 — End: 2015-05-30
  Administered 2015-05-30: 2500 [IU] via INTRAVENOUS
  Filled 2015-05-30: qty 2500

## 2015-05-30 MED ORDER — CHLORHEXIDINE GLUCONATE CLOTH 2 % EX PADS
6.0000 | MEDICATED_PAD | Freq: Every day | CUTANEOUS | Status: DC
  Administered 2015-05-30: 6 via TOPICAL

## 2015-05-30 MED ORDER — MUPIROCIN 2 % EX OINT
1.0000 | TOPICAL_OINTMENT | Freq: Two times a day (BID) | CUTANEOUS | Status: AC
Start: 2015-05-30 — End: 2015-06-03
  Administered 2015-05-30 – 2015-06-03 (×9): 1 via NASAL
  Filled 2015-05-30 (×2): qty 22

## 2015-05-30 MED ORDER — HEPARIN BOLUS VIA INFUSION
2500.0000 [IU] | Freq: Once | INTRAVENOUS | Status: AC
  Administered 2015-05-30: 2500 [IU] via INTRAVENOUS
  Filled 2015-05-30: qty 2500

## 2015-05-30 MED ORDER — VANCOMYCIN HCL 10 G IV SOLR
1250.0000 mg | INTRAVENOUS | Status: DC
  Administered 2015-05-30 – 2015-06-01 (×3): 1250 mg via INTRAVENOUS
  Filled 2015-05-30 (×5): qty 1250

## 2015-05-30 NOTE — Progress Notes (Signed)
Initial Nutrition Assessment  DOCUMENTATION CODES:   Not applicable  INTERVENTION:   -Cater to pt preferences on Carb Modified Diet order. Encourage intake of protein sources.  -Will recommend Glucerna on follow-up if intake inadequate.    NUTRITION DIAGNOSIS:   Increased nutrient needs related to wound healing as evidenced by estimated needs.  GOAL:   Patient will meet greater than or equal to 90% of their needs  MONITOR:   PO intake, Skin, Weight trends, Labs, I & O's  REASON FOR ASSESSMENT:   Low Braden    ASSESSMENT:   Pt admitted with urosepsis as well as chronic lower extremity diabetic foot ulcers. Podiatry consult placed; per MD note likely surgery tomorrow for I&D and possible amputations.  Past Medical History  Diagnosis Date  . Diabetes mellitus with neuropathy (HCC)   . Benign essential HTN   . PVD (peripheral vascular disease) (HCC)     Diet Order:  Diet Carb Modified Fluid consistency:: Thin; Room service appropriate?: Yes Diet NPO time specified  Pt eating lunch on visit, had tilapia, mashed potatoes, cole slaw and fresh fruit. Pt reports he prefers his fish fried. Pt did not have dentures in place, however reports being able to eat soft fish and potatoes without difficulty on visit. Pt reports his appetite has been good, however pt reports since he is unable to move as easily as he used to he has been making frozen meals from [employer]. Pt reports preferring to eat one food item meals, such as macaroni and cheese. Pt reports liking to make beans and will make a large pot and then refrigerate the rest for later.   Medications: Lantus, Senokot, NS at 4775mL/hr, heparin Labs: Glucose 352, BUN 54, and Cre 1.42  Gastrointestinal Profile: Last BM:  05/30/2015   Nutrition-Focused Physical Exam Findings:  Unable to complete Nutrition-Focused physical exam at this time.    Weight Change: Pt reports his weight has been 170lbs recently. Pt measured weight  currently 185lbs   Skin:   (Multiple Stage III and Unstageable pressure uclers of foot)   Height:   Ht Readings from Last 1 Encounters:  2015/07/19 5\' 10"  (1.778 m)    Weight:   Wt Readings from Last 1 Encounters:  2015/07/19 185 lb 1.6 oz (83.961 kg)   Wt Readings from Last 10 Encounters:  2015/07/19 185 lb 1.6 oz (83.961 kg)  01/08/15 180 lb (81.647 kg)     BMI:  Body mass index is 26.56 kg/(m^2).  Estimated Nutritional Needs:   Kcal:  2100-2520kcals  Protein:  92-109g protein  Fluid:  >2L fluid  EDUCATION NEEDS:   Education needs no appropriate at this time   Leda QuailAllyson Vanesa Renier, RD, LDN Pager [phone] Weekend/On-Call Pager [phone]

## 2015-05-30 NOTE — Progress Notes (Signed)
ANTICOAGULATION CONSULT NOTE - Initial Consult  Pharmacy Consult for Heparin Indication: chest pain/ACS  No Known Allergies  Patient Measurements: Height: 5\' 10"  (177.8 cm) Weight: 185 lb 1.6 oz (83.961 kg) IBW/kg (Calculated) : 73 Heparin Dosing Weight: 84.8 kg  Vital Signs: Temp: 97.8 F (36.6 C) (05/12 0354) Temp Source: Oral (05/11 1951) BP: 117/68 mmHg (05/12 0354) Pulse Rate: 90 (05/12 0354)  Labs:  Recent Labs  05/25/2015 1210 05/25/2015 2222 05/30/15 0439  HGB 9.2*  --  9.5*  HCT 28.7*  --  29.3*  PLT 215  --  193  APTT 37*  --   --   LABPROT 15.6*  --  14.2  INR 1.22  --  1.08  HEPARINUNFRC  --  0.11* 0.15*  CREATININE 1.63*  --  1.42*  TROPONINI 1.85*  --   --     Estimated Creatinine Clearance: 52.8 mL/min (by C-G formula based on Cr of 1.42).   Medical History: Past Medical History  Diagnosis Date  . Diabetes mellitus with neuropathy (HCC)   . Benign essential HTN   . PVD (peripheral vascular disease) (HCC)     Medications:  Prescriptions prior to admission  Medication Sig Dispense Refill Last Dose  . albuterol (ACCUNEB) 0.63 MG/3ML nebulizer solution Inhale 3 mLs into the lungs every 6 (six) hours as needed for wheezing or shortness of breath.    PRN  . aspirin EC 81 MG tablet Take 81 mg by mouth daily.   01/07/2015 at am  . atorvastatin (LIPITOR) 80 MG tablet Take 80 mg by mouth at bedtime.   01/06/2015 at pm  . clopidogrel (PLAVIX) 75 MG tablet Take 75 mg by mouth daily.   01/07/2015 at am  . collagenase (SANTYL) ointment Apply topically daily. 15 g 0   . insulin glargine (LANTUS) 100 UNIT/ML injection Inject 60 Units into the skin at bedtime.   01/07/2015 at pm  . piperacillin-tazobactam (ZOSYN) 3.375 GM/50ML IVPB Inject 50 mLs (3.375 g total) into the vein every 8 (eight) hours. 500 mL 0   . senna-docusate (SENOKOT-S) 8.6-50 MG tablet Take 1 tablet by mouth daily.   01/07/2015 at pm   Scheduled:  . atorvastatin  80 mg Oral QHS  . clopidogrel   75 mg Oral Daily  . collagenase   Topical Daily  . heparin  2,500 Units Intravenous Once  . insulin glargine  20 Units Subcutaneous QHS  . mupirocin ointment   Nasal BID  . piperacillin-tazobactam  3.375 g Intravenous Q8H  . senna-docusate  1 tablet Oral Daily  . sodium chloride flush  3 mL Intravenous Q12H   Infusions:  . sodium chloride 75 mL/hr at 05/25/2015 1735  . heparin 1,350 Units/hr (05/28/2015 2335)    Assessment: 65 y/o M admitted with cellulitis and NSTEMI. Patient reports taking Plavix but no blood thinners.   5/12 0500 HL resulted at 0.15  Goal of Therapy:  Heparin level 0.3-0.7 units/ml Monitor platelets by anticoagulation protocol: Yes   Plan:  Will bolus with Heparin 2500 units IV and increase rate to 1600 units/hr. Will recheck next HL in 6hr.  Johnny Lane, PharmD, BCPS 05/30/2015 5:58 AM

## 2015-05-30 NOTE — Care Management (Signed)
[facility]  [facility] reports the following: Patient has been at home with foul smelling wounds and frequently lethargic during visits.  He is malnourished and does not have support at home.  Our team members have been very concerned about his safety.  He has refused to visit his PCP or allow staff to call EMS.  Recently, he has become acutely ill as evident by decreasing O2 sats, fever, elevated heart rate, and increased confusion.   Please reach out Lesli Albeeindy Tucker at our office for additional details as needed.

## 2015-05-30 NOTE — Progress Notes (Signed)
*  PRELIMINARY RESULTS* Echocardiogram 2D Echocardiogram has been performed.  Georgann HousekeeperJerry R Hege 05/30/2015, 9:12 AM

## 2015-05-30 NOTE — Progress Notes (Addendum)
ANTICOAGULATION CONSULT NOTE -FOLLOW UP   Pharmacy Consult for Heparin Indication: chest pain/ACS  No Known Allergies  Patient Measurements: Height: 5\' 10"  (177.8 cm) Weight: 185 lb 1.6 oz (83.961 kg) IBW/kg (Calculated) : 73 Heparin Dosing Weight: 84.8 kg  Vital Signs: Temp: 97.4 F (36.3 C) (05/12 1154) Temp Source: Oral (05/12 1154) BP: 117/73 mmHg (05/12 1154) Pulse Rate: 90 (05/12 1154)  Labs:  Recent Labs  06/09/2015 1210 05/28/2015 2222 05/30/15 0439 05/30/15 1153  HGB 9.2*  --  9.5*  --   HCT 28.7*  --  29.3*  --   PLT 215  --  193  --   APTT 37*  --   --   --   LABPROT 15.6*  --  14.2  --   INR 1.22  --  1.08  --   HEPARINUNFRC  --  0.11* 0.15* 0.17*  CREATININE 1.63*  --  1.42*  --   TROPONINI 1.85*  --   --  0.65*    Estimated Creatinine Clearance: 52.8 mL/min (by C-G formula based on Cr of 1.42).   Medical History: Past Medical History  Diagnosis Date  . Diabetes mellitus with neuropathy (HCC)   . Benign essential HTN   . PVD (peripheral vascular disease) (HCC)     Medications:  Prescriptions prior to admission  Medication Sig Dispense Refill Last Dose  . albuterol (ACCUNEB) 0.63 MG/3ML nebulizer solution Inhale 3 mLs into the lungs every 6 (six) hours as needed for wheezing or shortness of breath.    PRN  . aspirin EC 81 MG tablet Take 81 mg by mouth daily.   01/07/2015 at am  . atorvastatin (LIPITOR) 80 MG tablet Take 80 mg by mouth at bedtime.   01/06/2015 at pm  . clopidogrel (PLAVIX) 75 MG tablet Take 75 mg by mouth daily.   01/07/2015 at am  . collagenase (SANTYL) ointment Apply topically daily. 15 g 0   . insulin glargine (LANTUS) 100 UNIT/ML injection Inject 60 Units into the skin at bedtime.   01/07/2015 at pm  . piperacillin-tazobactam (ZOSYN) 3.375 GM/50ML IVPB Inject 50 mLs (3.375 g total) into the vein every 8 (eight) hours. 500 mL 0   . senna-docusate (SENOKOT-S) 8.6-50 MG tablet Take 1 tablet by mouth daily.   01/07/2015 at pm    Scheduled:  . atorvastatin  80 mg Oral QHS  . Chlorhexidine Gluconate Cloth  6 each Topical Q0600  . clopidogrel  75 mg Oral Daily  . collagenase   Topical Daily  . heparin  2,500 Units Intravenous Once  . insulin glargine  20 Units Subcutaneous QHS  . mupirocin ointment  1 application Nasal BID  . mupirocin ointment   Nasal BID  . piperacillin-tazobactam  3.375 g Intravenous Q8H  . senna-docusate  1 tablet Oral Daily  . sodium chloride flush  3 mL Intravenous Q12H   Infusions:  . sodium chloride 75 mL/hr at 06/02/2015 1735  . heparin 1,600 Units/hr (05/30/15 [id number])    Assessment: 65 y/o M admitted with cellulitis and NSTEMI. Patient reports taking Plavix but no blood thinners.   5/12 0500 HL resulted at 0.15 5/12 ~12:00 HL resulted @ 0.17  Goal of Therapy:  Heparin level 0.3-0.7 units/ml Monitor platelets by anticoagulation protocol: Yes   Plan:  Will give heparin bolus of 2500 units x 1 and will also change heparin gtt to 1850 units/hr. Will order heparin level to be drawn @ 1830.    ADD: Patient off floor having MRI.  Heparin level ordered to be drawn @ 21:30.   Demetrius Charity, PharmD   05/30/2015 12:41 PM

## 2015-05-30 NOTE — Progress Notes (Addendum)
ANTIBIOTIC CONSULT NOTE - INITIAL  Pharmacy Consult for Vancomycin  Indication: wound infection/cellulitis  No Known Allergies  Patient Measurements: Height:  (177.8 cm) Weight: 185 lb 1.6 oz (83.961 kg) IBW/kg (Calculated) : 73 Adjusted Body Weight:   Vital Signs: Temp: 97.4 F (36.3 C) (05/12 1154) Temp Source: Oral (05/12 1154) BP: 117/73 mmHg (05/12 1154) Pulse Rate: 90 (05/12 1154) Intake/Output from previous day: 05/11 0701 - 05/12 0700 In: 91.3 [I.V.:91.3] Out: -  Intake/Output from this shift:    Labs:  Recent Labs  05/28/2015 1210 05/30/15 0439  WBC 19.7* 21.0*  HGB 9.2* 9.5*  PLT 215 193  CREATININE 1.63* 1.42*   Estimated Creatinine Clearance: 52.8 mL/min (by C-G formula based on Cr of 1.42). No results for input(s): VANCOTROUGH, VANCOPEAK, VANCORANDOM, GENTTROUGH, GENTPEAK, GENTRANDOM, TOBRATROUGH, TOBRAPEAK, TOBRARND, AMIKACINPEAK, AMIKACINTROU, AMIKACIN in the last 72 hours.   Microbiology: Recent Results (from the past 720 hour(s))  Urine culture     Status: None (Preliminary result)   Collection Time: 06/05/2015  2:29 PM  Result Value Ref Range Status   Specimen Description URINE, RANDOM  Final   Special Requests NONE  Final   Culture NO GROWTH < 24 HOURS  Final   Report Status PENDING  Incomplete  Surgical PCR screen     Status: Abnormal   Collection Time: 05/22/2015  6:30 PM  Result Value Ref Range Status   MRSA, PCR POSITIVE (A) NEGATIVE Final    Comment: CRITICAL RESULT CALLED TO, READ BACK BY AND VERIFIED WITH: [name] FORIANO AT 2019 05/30/2015 MLZ     Staphylococcus aureus POSITIVE (A) NEGATIVE Final    Comment:        The Xpert SA Assay (FDA approved for NASAL specimens in patients over 43 years of age), is one component of a comprehensive surveillance program.  Test performance has been validated by [facility] for patients greater than or equal to 1 year old. It is not intended to diagnose infection nor to guide or monitor  treatment.     Medical History: Past Medical History  Diagnosis Date  . Diabetes mellitus with neuropathy (HCC)   . Benign essential HTN   . PVD (peripheral vascular disease) (HCC)     Medications:  Prescriptions prior to admission  Medication Sig Dispense Refill Last Dose  . albuterol (ACCUNEB) 0.63 MG/3ML nebulizer solution Inhale 3 mLs into the lungs every 6 (six) hours as needed for wheezing or shortness of breath.    PRN  . aspirin EC 81 MG tablet Take 81 mg by mouth daily.   01/07/2015 at am  . atorvastatin (LIPITOR) 80 MG tablet Take 80 mg by mouth at bedtime.   01/06/2015 at pm  . clopidogrel (PLAVIX) 75 MG tablet Take 75 mg by mouth daily.   01/07/2015 at am  . collagenase (SANTYL) ointment Apply topically daily. 15 g 0   . insulin glargine (LANTUS) 100 UNIT/ML injection Inject 60 Units into the skin at bedtime.   01/07/2015 at pm  . piperacillin-tazobactam (ZOSYN) 3.375 GM/50ML IVPB Inject 50 mLs (3.375 g total) into the vein every 8 (eight) hours. 500 mL 0   . senna-docusate (SENOKOT-S) 8.6-50 MG tablet Take 1 tablet by mouth daily.   01/07/2015 at pm   Scheduled:  . atorvastatin  80 mg Oral QHS  . Chlorhexidine Gluconate Cloth  6 each Topical Q0600  . clopidogrel  75 mg Oral Daily  . collagenase   Topical Daily  . heparin  2,500 Units Intravenous Once  .  insulin glargine  20 Units Subcutaneous QHS  . mupirocin ointment  1 application Nasal BID  . mupirocin ointment   Nasal BID  . piperacillin-tazobactam  3.375 g Intravenous Q8H  . senna-docusate  1 tablet Oral Daily  . sodium chloride flush  3 mL Intravenous Q12H  . vancomycin  1,250 mg Intravenous Q18H   Assessment: Pharmacy consulted to dose and monitor Vancomycin. Patient is currently on Zosyn 3.375 g IV q8 hours as well. Patient received 1 dose of Vancomycin 1 g IV on 5/11 prior to consult.    Goal of Therapy:  Vancomycin trough level 15-20 mcg/ml  Plan:  Will start Vancomycin 1250 mg IV q18 hours. Will  order Vancomycin trough level prior to the 19:00 dose on 4/14.     Shizuo Biskup D 05/30/2015,12:50 PM

## 2015-05-30 NOTE — Progress Notes (Signed)
[facility] [place] [facility]                                                                                                                                                                                            Patient Demographics   Johnny RastDavid Lane, is a 65 y.o. male, DOB - 03/04/1950, ZOX:[id number]  Admit date - 2015-08-26   Admitting Physician Ramonita LabAruna Gouru, MD  Outpatient Primary MD for the patient is [name],[name], MD   LOS - 1  Subjective:Patient admitted with lower extremity wounds cellulitis He denies any chest pains or shortness of breath     Review of Systems:   CONSTITUTIONAL: Positive documented fever. NoPositive fatigue, weakness. No weight gain, no weight loss.  EYES: No blurry or double vision.  ENT: No tinnitus. No postnasal drip. No redness of the oropharynx.  RESPIRATORY: No cough, no wheeze, no hemoptysis. No dyspnea.  CARDIOVASCULAR: No chest pain. No orthopnea. No palpitations. No syncope.  GASTROINTESTINAL: No nausea, no vomiting or diarrhea. No abdominal pain. No melena or hematochezia.  GENITOURINARY: No dysuria or hematuria.  ENDOCRINE: No polyuria or nocturia. No heat or cold intolerance.  HEMATOLOGY: No anemia. No bruising. No bleeding.  INTEGUMENTARY: Multiple ulcers on the left leg. MUSCULOSKELETAL: No arthritis. No swelling. No gout.  NEUROLOGIC: No numbness, tingling, or ataxia. No seizure-type activity.  PSYCHIATRIC: No anxiety. No insomnia. No ADD.    Vitals:   Filed Vitals:   Aug 05, 2015 1951 05/30/15 0354 05/30/15 0800 05/30/15 1154  BP: 121/62 117/68 126/67 117/73  Pulse: 76 90 87 90  Temp: 98.4 F (36.9 C) 97.8 F (36.6 C) 98 F (36.7 C) 97.4 F (36.3 C)  TempSrc: Oral  Oral Oral  Resp: 18 18  22   Height:      Weight:      SpO2: 94% 90% 96% 91%    Wt Readings from Last 3 Encounters:  Aug 05, 2015 83.961 kg (185 lb 1.6 oz)  01/08/15 81.647 kg (180 lb)     Intake/Output Summary (Last 24 hours) at  05/30/15 1226 Last data filed at Aug 05, 2015 1848  Gross per 24 hour  Intake  91.25 ml  Output      0 ml  Net  91.25 ml    Physical Exam:   GENERAL: Pleasant-appearing in no apparent distress.  HEAD, EYES, EARS, NOSE AND THROAT: Atraumatic, normocephalic. Extraocular muscles are intact. Pupils equal and reactive to light. Sclerae anicteric. No conjunctival injection. No oro-pharyngeal erythema.  NECK: Supple. There is no jugular venous distention. No bruits, no lymphadenopathy, no thyromegaly.  HEART: Regular rate and rhythm,. No murmurs, no rubs, no  clicks.  LUNGS: Clear to auscultation bilaterally. No rales or rhonchi. No wheezes.  ABDOMEN: Soft, flat, nontender, nondistended. Has good bowel sounds. No hepatosplenomegaly appreciated.  EXTREMITIES: No evidence of any cyanosis, clubbing, 2+ edema.  Diminished pedal and radial pulses bilaterally.  NEUROLOGIC: The patient is alert, awake, and oriented x3 with no focal motor or sensory deficits appreciated bilaterally.  SKIN: Multiple ulcers on the left lower extremity with drainage, also left heel ulcer, drainage from his toes Psych: Not anxious, depressed LN: No inguinal LN enlargement    Antibiotics   Anti-infectives    Start     Dose/Rate Route Frequency Ordered Stop   05/26/2015 2100  piperacillin-tazobactam (ZOSYN) IVPB 3.375 g     3.375 g 12.5 mL/hr over 240 Minutes Intravenous Every 8 hours 06/05/2015 1726     06/15/2015 1245  piperacillin-tazobactam (ZOSYN) IVPB 3.375 g     3.375 g 100 mL/hr over 30 Minutes Intravenous  Once 05/30/2015 1235 05/25/2015 1430   06/17/2015 1245  vancomycin (VANCOCIN) IVPB 1000 mg/200 mL premix     1,000 mg 200 mL/hr over 60 Minutes Intravenous  Once 06/07/2015 1235 05/23/2015 1622      Medications   Scheduled Meds: . atorvastatin  80 mg Oral QHS  . Chlorhexidine Gluconate Cloth  6 each Topical Q0600  . clopidogrel  75 mg Oral Daily  . collagenase   Topical Daily  . insulin glargine  20 Units Subcutaneous  QHS  . mupirocin ointment  1 application Nasal BID  . mupirocin ointment   Nasal BID  . piperacillin-tazobactam  3.375 g Intravenous Q8H  . senna-docusate  1 tablet Oral Daily  . sodium chloride flush  3 mL Intravenous Q12H   Continuous Infusions: . sodium chloride 75 mL/hr at 06/18/2015 1735  . heparin 1,600 Units/hr (05/30/15 0653)   PRN Meds:.acetaminophen **OR** acetaminophen, albuterol, morphine injection, nitroGLYCERIN, oxyCODONE   Data Review:   Micro Results Recent Results (from the past 240 hour(s))  Urine culture     Status: None (Preliminary result)   Collection Time: 06/13/2015  2:29 PM  Result Value Ref Range Status   Specimen Description URINE, RANDOM  Final   Special Requests NONE  Final   Culture NO GROWTH < 24 HOURS  Final   Report Status PENDING  Incomplete  Surgical PCR screen     Status: Abnormal   Collection Time: 06/04/2015  6:30 PM  Result Value Ref Range Status   MRSA, PCR POSITIVE (A) NEGATIVE Final    Comment: CRITICAL RESULT CALLED TO, READ BACK BY AND VERIFIED WITH: [name] FORIANO AT 2019 06/10/2015 MLZ     Staphylococcus aureus POSITIVE (A) NEGATIVE Final    Comment:        The Xpert SA Assay (FDA approved for NASAL specimens in patients over 68 years of age), is one component of a comprehensive surveillance program.  Test performance has been validated by [facility] for patients greater than or equal to 46 year old. It is not intended to diagnose infection nor to guide or monitor treatment.     Radiology Reports Dg Chest Portable 1 View  05/25/2015  CLINICAL DATA:  Edema and cellulitis bilateral lower extremity, possible sepsis EXAM: PORTABLE CHEST 1 VIEW COMPARISON:  01/13/2015 FINDINGS: Cardiomegaly. Central mild vascular congestion without convincing pulmonary edema. Small to moderate bilateral pleural effusion with bilateral basilar atelectasis or infiltrate. IMPRESSION: Central vascular congestion without convincing pulmonary edema. Small  to moderate bilateral pleural effusion with bilateral basilar atelectasis or infiltrate. Electronically  Signed   By: Natasha Mead M.D.   On: 06/03/2015 12:58   Dg Foot Complete Left  05/23/2015  CLINICAL DATA:  Multiple open wounds left foot evaluate for osteomyelitis EXAM: LEFT FOOT - COMPLETE 3+ VIEW COMPARISON:  01/14/2015 FINDINGS: There is pronounced tissue loss over the medial aspect of the great toe from the level of the metatarsal phalangeal joints to the interphalangeal joint. This is similar to the prior study. There is no periosteal reaction or cortical destruction along this region. There is another deep soft tissue ulcer projecting over the base of the first metatarsal along the sole. No evidence of osseous involvement seen in this area. There is significant soft tissue defect over the calcaneal tuberosity. The calcaneal tuberosity demonstrates cortical thickening. This area was not included on previous MRI. Previous radiograph 01/08/2015 shows similar calcaneal tuberosity cortical thickening. There is extensive soft tissue swelling throughout the foot. There is extensive small vessel calcification. This portion of anatomy is not included on the previous MRI. IMPRESSION: New soft tissue defects over the calcaneal tuberosity and over the plantar surface of the midfoot with no findings to suggest osteomyelitis. As seen on previous MRI, extensive soft tissue defects over the medial aspect of the great toe without evidence of osteomyelitis. Please note that MRI would be more sensitive for early findings of osteomyelitis. Electronically Signed   By: Esperanza Heir M.D.   On: 05/24/2015 14:05     CBC  Recent Labs Lab 06/06/2015 1210 05/30/15 0439  WBC 19.7* 21.0*  HGB 9.2* 9.5*  HCT 28.7* 29.3*  PLT 215 193  MCV 85.6 87.2  MCH 27.5 28.2  MCHC 32.1 32.3  RDW 16.1* 15.8*  LYMPHSABS 0.5*  --   MONOABS 0.8  --   EOSABS 0.0  --   BASOSABS 0.0  --     Chemistries   Recent Labs Lab  06/01/2015 1210 05/30/15 0439  NA 137 140  K 4.6 4.1  CL 99* 103  CO2 30 31  GLUCOSE 357* 352*  BUN 47* 54*  CREATININE 1.63* 1.42*  CALCIUM 8.4* 8.1*  AST 143* 64*  ALT 77* 71*  ALKPHOS 55 49  BILITOT 1.1 0.9   ------------------------------------------------------------------------------------------------------------------ estimated creatinine clearance is 52.8 mL/min (by C-G formula based on Cr of 1.42). ------------------------------------------------------------------------------------------------------------------ No results for input(s): HGBA1C in the last 72 hours. ------------------------------------------------------------------------------------------------------------------  Recent Labs  05/30/15 0439  CHOL 87  HDL 22*  LDLCALC 41  TRIG 161  CHOLHDL 4.0   ------------------------------------------------------------------------------------------------------------------ No results for input(s): TSH, T4TOTAL, T3FREE, THYROIDAB in the last 72 hours.  Invalid input(s): FREET3 ------------------------------------------------------------------------------------------------------------------ No results for input(s): VITAMINB12, FOLATE, FERRITIN, TIBC, IRON, RETICCTPCT in the last 72 hours.  Coagulation profile  Recent Labs Lab 06/16/2015 1210 05/30/15 0439  INR 1.22 1.08    No results for input(s): DDIMER in the last 72 hours.  Cardiac Enzymes  Recent Labs Lab 06/05/2015 1210  TROPONINI 1.85*   ------------------------------------------------------------------------------------------------------------------ Invalid input(s): POCBNP    Assessment & Plan   Jguadalupe Opiela is a 65 y.o. male with a known history of Diabetes mellitus, hypertension and peripheral vascular disease is presenting to the ED with a chief complaint of worsening of his mental status from his baseline.   #Sepsis Lower extremity wounds with cellulitis Continue broad-spectrum  antibiotics with bank and Zosyn Pleura for DVT negative  #Peripheral vascular disease seen by vascular surgery plan for arteriogram  #Non-STEMI troponin at 1.85 Continue heparin therapy Seen by cardiology Supportive care Elevated echocardiogram of the  heart   #  diabetes mellitus Patient is currently nothing by mouth Increase Lantus dose to 40 units Continue sliding scale insulin  #Chronic history of congestive heart failure Currently compensated continue to monitor echocardiogram of the heart pending     Code Status Orders        Start     Ordered   2015-05-30 1727  Full code   Continuous     2015/05/30 1726    Code Status History    Date Active Date Inactive Code Status Order ID Comments User Context   01/08/2015 11:53 AM 01/14/2015  8:08 PM DNR [id number]  Adrian Saran, MD Inpatient           Consults  Cardiology, vascular surgery  DVT Prophylaxis  heparin drip  Lab Results  Component Value Date   PLT 193 05/30/2015     Time Spent in minutes  35 minutes Greater than 50% of time spent in care coordination and counseling patient regarding the condition and plan of care.   Auburn Bilberry M.D on 05/30/2015 at 12:26 PM  Between 7am to 6pm - Pager - [phone]  After 6pm go to [email or web address] - password [email or web address] [facility]  [facility] [place] [facility]   Office  [phone]

## 2015-05-30 NOTE — Progress Notes (Signed)
ANTICOAGULATION CONSULT NOTE -FOLLOW UP   Pharmacy Consult for Heparin Indication: chest pain/ACS  No Known Allergies  Patient Measurements: Height:  (177.8 cm) Weight: 185 lb 1.6 oz (83.961 kg) IBW/kg (Calculated) : 73 Heparin Dosing Weight: 84.8 kg  Vital Signs: Temp: 97.6 F (36.4 C) (05/12 1945) Temp Source: Oral (05/12 1945) BP: 121/59 mmHg (05/12 1945) Pulse Rate: 85 (05/12 1945)  Labs:  Recent Labs  06/10/2015 1210  05/30/15 0439 05/30/15 1153 05/30/15 2125  HGB 9.2*  --  9.5*  --   --   HCT 28.7*  --  29.3*  --   --   PLT 215  --  193  --   --   APTT 37*  --   --   --   --   LABPROT 15.6*  --  14.2  --   --   INR 1.22  --  1.08  --   --   HEPARINUNFRC  --   < > 0.15* 0.17* 0.47  CREATININE 1.63*  --  1.42*  --   --   TROPONINI 1.85*  --   --  0.65*  --   < > = values in this interval not displayed.  Estimated Creatinine Clearance: 52.8 mL/min (by C-G formula based on Cr of 1.42).   Medical History: Past Medical History  Diagnosis Date  . Diabetes mellitus with neuropathy (HCC)   . Benign essential HTN   . PVD (peripheral vascular disease) (HCC)     Medications:  Prescriptions prior to admission  Medication Sig Dispense Refill Last Dose  . albuterol (ACCUNEB) 0.63 MG/3ML nebulizer solution Inhale 3 mLs into the lungs every 6 (six) hours as needed for wheezing or shortness of breath.    PRN  . aspirin EC 81 MG tablet Take 81 mg by mouth daily.   01/07/2015 at am  . atorvastatin (LIPITOR) 80 MG tablet Take 80 mg by mouth at bedtime.   01/06/2015 at pm  . clopidogrel (PLAVIX) 75 MG tablet Take 75 mg by mouth daily.   01/07/2015 at am  . collagenase (SANTYL) ointment Apply topically daily. 15 g 0   . insulin glargine (LANTUS) 100 UNIT/ML injection Inject 60 Units into the skin at bedtime.   01/07/2015 at pm  . piperacillin-tazobactam (ZOSYN) 3.375 GM/50ML IVPB Inject 50 mLs (3.375 g total) into the vein every 8 (eight) hours. 500 mL 0   .  senna-docusate (SENOKOT-S) 8.6-50 MG tablet Take 1 tablet by mouth daily.   01/07/2015 at pm   Scheduled:  . atorvastatin  80 mg Oral QHS  . Chlorhexidine Gluconate Cloth  6 each Topical Q0600  . clopidogrel  75 mg Oral Daily  . collagenase   Topical Daily  . insulin glargine  20 Units Subcutaneous QHS  . mupirocin ointment  1 application Nasal BID  . mupirocin ointment   Nasal BID  . piperacillin-tazobactam  3.375 g Intravenous Q8H  . senna-docusate  1 tablet Oral Daily  . sodium chloride flush  3 mL Intravenous Q12H  . vancomycin  1,250 mg Intravenous Q18H   Infusions:  . sodium chloride 75 mL/hr at 05/30/15 1415  . heparin 1,850 Units/hr (05/30/15 1543)    Assessment: 65 y/o M admitted with cellulitis and NSTEMI. Patient reports taking Plavix but no blood thinners.   5/12 0500 HL resulted at 0.15 5/12 ~12:00 HL resulted @ 0.17  Goal of Therapy:  Heparin level 0.3-0.7 units/ml Monitor platelets by anticoagulation protocol: Yes   Plan:  Will give heparin bolus of 2500 units x 1 and will also change heparin gtt to 1850 units/hr. Will order heparin level to be drawn @ 1830.    ADD: Patient off floor having MRI. Heparin level ordered to be drawn @ 21:30.   5/12:  HL @ 21:30 = 0.47.  Will continue this pt on current rate of 1850 units/hr and recheck HL on 5/13 @ 3:30.    Keigan Girten D 05/30/2015 9:58 PM

## 2015-05-30 NOTE — Progress Notes (Signed)
Inpatient Diabetes Program Recommendations  AACE/ADA: New Consensus Statement on Inpatient Glycemic Control (2015)  Target Ranges:  Prepandial:   less than 140 mg/dL      Peak postprandial:   less than 180 mg/dL (1-2 hours)      Critically ill patients:  140 - 180 mg/dL   Results for Johnny Lane, Johnny Lane (MRN [id number]) as of 05/30/2015 11:08  Ref. Range 22-Apr-2015 20:40 05/30/2015 07:22  Glucose-Capillary Latest Ref Range: 65-99 mg/dL [id number] (H) [id number] (H)   Review of Glycemic Control  Diabetes history: DM 2 Outpatient Diabetes medications: Latnus 60 units Current orders for Inpatient glycemic control: Lantus 20 units  Inpatient Diabetes Program Recommendations: Insulin - Basal: Glucose 294 mg/dl this am after 20 units of basal insulin given last night. Patient takes 60 units at home. Please consider increasing basal insulin to Lantus 35 units.  Correction insulin not ordered. Please consider starting Novolog Moderate Correction (0-15 units) TID + HS scale.  Thanks,  Christena DeemShannon Soraiya Ahner RN, MSN, [facility] Inpatient Diabetes Coordinator Team Pager [phone] (8a-5p)

## 2015-05-30 NOTE — Consult Note (Signed)
Patient Demographics  Johnny Lane, is a 65 y.o. male   MRN: [id number]   DOB - 1950/05/12  Admit Date - 04-Jun-2015    Outpatient Primary MD for the patient is [name],[name], MD  Consult requested in the Hospital by Auburn Bilberry, MD, On 05/30/2015    Reason for consult diabetic foot infection left foot with 3 separate ulcerations.   With History of -  Past Medical History  Diagnosis Date  . Diabetes mellitus with neuropathy (HCC)   . Benign essential HTN   . PVD (peripheral vascular disease) [facility])       Past Surgical History  Procedure Laterality Date  . Peripheral vascular catheterization  01/09/2015    Procedure: Lower Extremity Intervention;  Surgeon: Annice Needy, MD;  Location: [facility] INVASIVE CV LAB;  Service: Cardiovascular;;  . Peripheral vascular catheterization N/A 01/09/2015    Procedure: Abdominal Aortogram w/Lower Extremity;  Surgeon: Annice Needy, MD;  Location: [facility] INVASIVE CV LAB;  Service: Cardiovascular;  Laterality: N/A;    in for   Chief Complaint  Patient presents with  . Altered Mental Status     HPI  Johnny Lane  is a 64 y.o. male, Patient has had ulcerations going on for a number of months on this left foot. She recently had home health nurse change in dressings but he has progressively worsened over the last couple weeks. He states over the nurses out about 4 days ago. He has a history of severe lymphedema to both lower extremities. He's had an incision and drainage 2 ulcerations on that left foot before. Previously I think was at the first metatarsal head and possibly the plantar foot but now he has a lesion at the posterior heel as well.    Review of Systems  : Patient's alert and seemingly well oriented. He states he feels like she's had the fever couple times. Otherwise states  she feels okay. Vital signs are listed below. Currently is getting oxygen through nasal cannula Social History Social History  Substance Use Topics  . Smoking status: Former Games developer  . Smokeless tobacco: Not on file  . Alcohol Use: Not on file     Family History History reviewed. No pertinent family history.   Prior to Admission medications   Medication Sig Start Date End Date Taking? Authorizing Provider  albuterol (ACCUNEB) 0.63 MG/3ML nebulizer solution Inhale 3 mLs into the lungs every 6 (six) hours as needed for wheezing or shortness of breath.     Historical Provider, MD  aspirin EC 81 MG tablet Take 81 mg by mouth daily.    Historical Provider, MD  atorvastatin (LIPITOR) 80 MG tablet Take 80 mg by mouth at bedtime.    Historical Provider, MD  clopidogrel (PLAVIX) 75 MG tablet Take 75 mg by mouth daily.    Historical Provider, MD  collagenase (SANTYL) ointment Apply topically daily. 01/14/15   Shaune Pollack, MD  insulin glargine (LANTUS) 100 UNIT/ML injection Inject 60 Units into the skin at bedtime.    Historical Provider, MD  piperacillin-tazobactam (ZOSYN) 3.375 GM/50ML IVPB Inject 50 mLs (3.375 g total) into the vein every 8 (eight) hours. 01/14/15   Shaune Pollack, MD  senna-docusate (  SENOKOT-S) 8.6-50 MG tablet Take 1 tablet by mouth daily.    Historical Provider, MD    Anti-infectives    Start     Dose/Rate Route Frequency Ordered Stop   05/30/15 1300  vancomycin (VANCOCIN) 1,250 mg in sodium chloride 0.9 % 250 mL IVPB     1,250 mg 166.7 mL/hr over 90 Minutes Intravenous Every 18 hours 05/30/15 1247     06-14-2015 2100  piperacillin-tazobactam (ZOSYN) IVPB 3.375 g     3.375 g 12.5 mL/hr over 240 Minutes Intravenous Every 8 hours 06-14-2015 1726     2015/06/14 1245  piperacillin-tazobactam (ZOSYN) IVPB 3.375 g     3.375 g 100 mL/hr over 30 Minutes Intravenous  Once 06/14/2015 1235 2015-06-14 1430   14-Jun-2015 1245  vancomycin (VANCOCIN) IVPB 1000 mg/200 mL premix     1,000 mg 200 mL/hr  over 60 Minutes Intravenous  Once 2015-06-14 1235 06-14-15 1622      Scheduled Meds: . atorvastatin  80 mg Oral QHS  . Chlorhexidine Gluconate Cloth  6 each Topical Q0600  . clopidogrel  75 mg Oral Daily  . collagenase   Topical Daily  . heparin  2,500 Units Intravenous Once  . insulin glargine  20 Units Subcutaneous QHS  . mupirocin ointment  1 application Nasal BID  . mupirocin ointment   Nasal BID  . piperacillin-tazobactam  3.375 g Intravenous Q8H  . senna-docusate  1 tablet Oral Daily  . sodium chloride flush  3 mL Intravenous Q12H  . vancomycin  1,250 mg Intravenous Q18H   Continuous Infusions: . sodium chloride 75 mL/hr at 06-14-15 1735  . heparin 1,600 Units/hr (05/30/15 0653)  Vitals  Blood pressure 117/73, pulse 90, temperature 97.4 F (36.3 C), temperature source Oral, resp. rate 22, height 5\' 10"  (1.778 m), weight 83.961 kg (185 lb 1.6 oz), SpO2 91 %.  Lower Extremity exam:  Vascular:Nonpalpable to both PT and DP arteries bilateral. Patient has severe wrinkling to the skin due to his lymphedema. The swelling is down some as compared to what typically is but has quite a few changes to the skin of his legs and ankles and feet.  Dermatological: Patient has 3 ulcerations on the left foot one at the first metatarsal head which is about 4.5 x 2 cm in size that appears to be going to the capsular tissue of the first metatarsal phalangeal joint. There is purulence that emanates from the area with squeezing of that joint. The wound is plantar on the central portion of the plantar aspect of the foot. Heavy pus is draining from this area with some superficial necrotic tissue. Some of this was cleaned off and it does appear to be some granulation at a deeper level but there is a sinus tract that was probed that runs about 4-5 cm proximal and is likely the source of some of this pus. Was also a wound on the posterior left heel approximately 4 cm in diameter with some evidence of healing  but there is pus draining out of the plantar aspect of the wound this timeframe.   neurological: Long history of diabetic neuropathy  O.rtho: Concerns are present for possible osteomyelitis to the first metatarsal phalangeal joint as well as the heel on the left foot. Wound and the purulence associated with it indicated a possible deeper infection that could involve bone.ata Review  CBC  Recent Labs Lab Jun 14, 2015 1210 05/30/15 0439  WBC 19.7* 21.0*  HGB 9.2* 9.5*  HCT 28.7* 29.3*  PLT 215 193  MCV 85.6 87.2  MCH 27.5 28.2  MCHC 32.1 32.3  RDW 16.1* 15.8*  LYMPHSABS 0.5*  --   MONOABS 0.8  --   EOSABS 0.0  --   BASOSABS 0.0  --    ------------------------------------------------------------------------------------------------------------------  Chemistries   Recent Labs Lab 06/04/2015 1210 05/30/15 0439  NA 137 140  K 4.6 4.1  CL 99* 103  CO2 30 31  GLUCOSE 357* 352*  BUN 47* 54*  CREATININE 1.63* 1.42*  CALCIUM 8.4* 8.1*  AST 143* 64*  ALT 77* 71*  ALKPHOS 55 49  BILITOT 1.1 0.9   ------------------------------------------------------------------------------------------------------------------ estimated creatinine clearance is 52.8 mL/min (by C-G formula based on Cr of 1.42). ------------------------------------------------------------------------------------------------------------------ No results for input(s): TSH, T4TOTAL, T3FREE, THYROIDAB in the last 72 hours.  Invalid input(s): FREET3   Coagulation profile  Recent Labs Lab 05/25/2015 1210 05/30/15 0439  INR 1.22 1.08   ------------------------------------------------------------------------------------------------------------------- No results for input(s): DDIMER in the last 72 hours. -------------------------------------------------------------------------------------------------------------------  Cardiac Enzymes  Recent Labs Lab 05/30/2015 1210 05/30/15 1153  TROPONINI 1.85* 0.65*    ------------------------------------------------------------------------------------------------------------------ Invalid input(s): POCBNP   ---------------------------------------------------------------------------------------------------------------  Urinalysis    Component Value Date/Time   COLORURINE AMBER* 05/24/2015 1429   APPEARANCEUR CLOUDY* 06/04/2015 1429   LABSPEC 1.023 05/27/2015 1429   PHURINE 5.0 06/05/2015 1429   GLUCOSEU 50* 06/03/2015 1429   HGBUR 2+* 05/26/2015 1429   BILIRUBINUR NEGATIVE 06/17/2015 1429   KETONESUR NEGATIVE 06/10/2015 1429   PROTEINUR >500* 05/31/2015 1429   NITRITE NEGATIVE 06/14/2015 1429   LEUKOCYTESUR NEGATIVE 05/31/2015 1429     Imaging results:   [place] Venous Img Lower Bilateral  05/30/2015  CLINICAL DATA:  [age] year old male with a history of leg pain EXAM: BILATERAL LOWER EXTREMITY VENOUS DOPPLER ULTRASOUND TECHNIQUE: Gray-scale sonography with graded compression, as well as color Doppler and duplex ultrasound were performed to evaluate the lower extremity deep venous systems from the level of the common femoral vein and including the common femoral, femoral, profunda femoral, popliteal and calf veins including the posterior tibial, peroneal and gastrocnemius veins when visible. The superficial great saphenous vein was also interrogated. Spectral Doppler was utilized to evaluate flow at rest and with distal augmentation maneuvers in the common femoral, femoral and popliteal veins. COMPARISON:  None. FINDINGS: RIGHT LOWER EXTREMITY Common Femoral Vein: No evidence of thrombus. Normal compressibility, respiratory phasicity and response to augmentation. Saphenofemoral Junction: No evidence of thrombus. Normal compressibility and flow on color Doppler imaging. Profunda Femoral Vein: No evidence of thrombus. Normal compressibility and flow on color Doppler imaging. Femoral Vein: No evidence of thrombus. Normal compressibility, respiratory phasicity  and response to augmentation. Popliteal Vein: No evidence of thrombus. Normal compressibility, respiratory phasicity and response to augmentation. Calf Veins: No evidence of thrombus. Normal compressibility and flow on color Doppler imaging. Superficial Great Saphenous Vein: No evidence of thrombus. Normal compressibility and flow on color Doppler imaging. Other Findings:  Edema of the lower extremity. Borderline enlarged lymph nodes of the inguinal region. LEFT LOWER EXTREMITY Common Femoral Vein: No evidence of thrombus. Normal compressibility, respiratory phasicity and response to augmentation. Saphenofemoral Junction: No evidence of thrombus. Normal compressibility and flow on color Doppler imaging. Profunda Femoral Vein: No evidence of thrombus. Normal compressibility and flow on color Doppler imaging. Femoral Vein: No evidence of thrombus. Normal compressibility, respiratory phasicity and response to augmentation. Popliteal Vein: No evidence of thrombus. Normal compressibility, respiratory phasicity and response to augmentation. Calf Veins: No evidence of thrombus. Normal compressibility and flow on color Doppler imaging. Superficial Great  Saphenous Vein: No evidence of thrombus. Normal compressibility and flow on color Doppler imaging. Other Findings:  Edema lower extremity. Borderline enlarged lymph nodes of the inguinal region. IMPRESSION: Sonographic survey bilateral lower extremities negative for DVT. Edema of the lower extremities with likely reactive lymph nodes of the inguinal region. Signed, Yvone Neu. Loreta Ave, DO [facility] Electronically Signed   By: Gilmer Mor D.O.   On: 05/30/2015 12:25   Dg Chest Portable 1 View  2015/06/12  CLINICAL DATA:  Edema and cellulitis bilateral lower extremity, possible sepsis EXAM: PORTABLE CHEST 1 VIEW COMPARISON:  01/13/2015 FINDINGS: Cardiomegaly. Central mild vascular congestion without convincing  pulmonary edema. Small to moderate bilateral pleural effusion with bilateral basilar atelectasis or infiltrate. IMPRESSION: Central vascular congestion without convincing pulmonary edema. Small to moderate bilateral pleural effusion with bilateral basilar atelectasis or infiltrate. Electronically Signed   By: Natasha Mead M.D.   On: Jun 12, 2015 12:58   Dg Foot Complete Left  06-12-15  CLINICAL DATA:  Multiple open wounds left foot evaluate for osteomyelitis EXAM: LEFT FOOT - COMPLETE 3+ VIEW COMPARISON:  01/14/2015 FINDINGS: There is pronounced tissue loss over the medial aspect of the great toe from the level of the metatarsal phalangeal joints to the interphalangeal joint. This is similar to the prior study. There is no periosteal reaction or cortical destruction along this region. There is another deep soft tissue ulcer projecting over the base of the first metatarsal along the sole. No evidence of osseous involvement seen in this area. There is significant soft tissue defect over the calcaneal tuberosity. The calcaneal tuberosity demonstrates cortical thickening. This area was not included on previous MRI. Previous radiograph 01/08/2015 shows similar calcaneal tuberosity cortical thickening. There is extensive soft tissue swelling throughout the foot. There is extensive small vessel calcification. This portion of anatomy is not included on the previous MRI. IMPRESSION: New soft tissue defects over the calcaneal tuberosity and over the plantar surface of the midfoot with no findings to suggest osteomyelitis. As seen on previous MRI, extensive soft tissue defects over the medial aspect of the great toe without evidence of osteomyelitis. Please note that MRI would be more sensitive for early findings of osteomyelitis. Electronically Signed   By: Esperanza Heir M.D.   On: 2015-06-12 14:05   Assessment & Plan: Pronounced infection to the left foot with purulent drainage from all 3 wounds. Significant history of  severe lymphedema and diabetic neuropathy as well as chronic ulceration to these areas. Plan: I explained to the patient that he has a poor prognosis for healing with this and that he faces a very possible likelihood of limb loss. That said he is very strongly against considering that at this point and wants to pursue any venue possible to try and salvage as much of his foot as possible. I explained to him that he may have osteomyelitis and that the bone would need to be resected and he states that he be okay with amputation of the great toe if that is necessary. I will get an MRI ordered for him today and scheduled for surgery Diane D the infection and remove any infected bone that may be necessary. Also explained that it is possible I will need to amputate the first ray and also possibly remove some of the heel bone if it appears to be infected on the MRI. Hopefully this is all soft tissue and [place] and either region and apply vancomycin and gentamicin beads to promote healing. Scheduled  for surgery tomorrow morning   Active Problems:   Sepsis [facility])     Family Communication: Plan discussed with patient .   Epimenio Sarin M.D on 05/30/2015 at 1:15 PM  Thank you for the consult, we will follow the patient with you in the Hospital.

## 2015-05-31 ENCOUNTER — Inpatient Hospital Stay: Payer: Medicare Other

## 2015-05-31 DIAGNOSIS — I469 Cardiac arrest, cause unspecified: Secondary | ICD-10-CM

## 2015-05-31 DIAGNOSIS — J9600 Acute respiratory failure, unspecified whether with hypoxia or hypercapnia: Secondary | ICD-10-CM | POA: Insufficient documentation

## 2015-05-31 DIAGNOSIS — J9601 Acute respiratory failure with hypoxia: Secondary | ICD-10-CM

## 2015-05-31 DIAGNOSIS — A419 Sepsis, unspecified organism: Principal | ICD-10-CM

## 2015-05-31 LAB — BASIC METABOLIC PANEL
ANION GAP: 10 (ref 5–15)
BUN: 56 mg/dL — AB (ref 6–20)
CALCIUM: 8.3 mg/dL — AB (ref 8.9–10.3)
CO2: 27 mmol/L (ref 22–32)
Chloride: 103 mmol/L (ref 101–111)
Creatinine, Ser: 1.53 mg/dL — ABNORMAL HIGH (ref 0.61–1.24)
GFR calc Af Amer: 53 mL/min — ABNORMAL LOW (ref >60)
GFR, EST NON AFRICAN AMERICAN: 46 mL/min — AB (ref >60)
Glucose, Bld: 421 mg/dL — ABNORMAL HIGH (ref 65–99)
Potassium: 4.8 mmol/L (ref 3.5–5.1)
SODIUM: 140 mmol/L (ref 135–145)

## 2015-05-31 LAB — LACTIC ACID, PLASMA: LACTIC ACID, VENOUS: 3.5 mmol/L — AB (ref 0.5–2.0)

## 2015-05-31 LAB — BLOOD GAS, ARTERIAL
ACID-BASE EXCESS: 10.6 mmol/L — AB (ref 0.0–3.0)
ALLENS TEST (PASS/FAIL): POSITIVE — AB
Acid-base deficit: 0.5 mmol/L (ref 0.0–2.0)
BICARBONATE: 26.7 meq/L (ref 21.0–28.0)
Bicarbonate: 35.1 mEq/L — ABNORMAL HIGH (ref 21.0–28.0)
FIO2: 1
FIO2: 0.8
LHR: 18 {breaths}/min
MECHVT: 550 mL
Mechanical Rate: 18
O2 SAT: 99.4 %
O2 Saturation: 98.5 %
PATIENT TEMPERATURE: 37
PCO2 ART: 46 mmHg (ref 32.0–48.0)
PEEP/CPAP: 10 cmH2O
PEEP: 5 cmH2O
PH ART: 7.49 — AB (ref 7.350–7.450)
Patient temperature: 37
RATE: 15 resp/min
VT: 550 mL
pCO2 arterial: 53 mmHg — ABNORMAL HIGH (ref 32.0–48.0)
pH, Arterial: 7.31 — ABNORMAL LOW (ref 7.350–7.450)
pO2, Arterial: 124 mmHg — ABNORMAL HIGH (ref 83.0–108.0)
pO2, Arterial: 144 mmHg — ABNORMAL HIGH (ref 83.0–108.0)

## 2015-05-31 LAB — URINE CULTURE: CULTURE: NO GROWTH

## 2015-05-31 LAB — CBC
HCT: 29.7 % — ABNORMAL LOW (ref 40.0–52.0)
Hemoglobin: 9.3 g/dL — ABNORMAL LOW (ref 13.0–18.0)
MCH: 27.6 pg (ref 26.0–34.0)
MCHC: 31.2 g/dL — ABNORMAL LOW (ref 32.0–36.0)
MCV: 88.5 fL (ref 80.0–100.0)
PLATELETS: 215 10*3/uL (ref 150–440)
RBC: 3.36 MIL/uL — AB (ref 4.40–5.90)
RDW: 16.2 % — ABNORMAL HIGH (ref 11.5–14.5)
WBC: 24.6 10*3/uL — AB (ref 3.8–10.6)

## 2015-05-31 LAB — PROCALCITONIN: PROCALCITONIN: 8.2 ng/mL

## 2015-05-31 LAB — TROPONIN I
TROPONIN I: 0.66 ng/mL — AB (ref <0.031)
TROPONIN I: 1.04 ng/mL — AB (ref <0.031)
Troponin I: 1.56 ng/mL — ABNORMAL HIGH (ref <0.031)

## 2015-05-31 LAB — GLUCOSE, CAPILLARY
GLUCOSE-CAPILLARY: 328 mg/dL — AB (ref 65–99)
GLUCOSE-CAPILLARY: 352 mg/dL — AB (ref 65–99)
GLUCOSE-CAPILLARY: 339 mg/dL — AB (ref 65–99)
GLUCOSE-CAPILLARY: 292 mg/dL — AB (ref 65–99)
Glucose-Capillary: 335 mg/dL — ABNORMAL HIGH (ref 65–99)
Glucose-Capillary: 227 mg/dL — ABNORMAL HIGH (ref 65–99)

## 2015-05-31 LAB — FIBRIN DERIVATIVES D-DIMER (ARMC ONLY)

## 2015-05-31 LAB — EXPECTORATED SPUTUM ASSESSMENT W GRAM STAIN, RFLX TO RESP C

## 2015-05-31 LAB — PROTIME-INR
INR: 1.06
PROTHROMBIN TIME: 14 s (ref 11.4–15.0)

## 2015-05-31 LAB — AMMONIA: Ammonia: 34 umol/L (ref 9–35)

## 2015-05-31 LAB — PHOSPHORUS: PHOSPHORUS: 5.3 mg/dL — AB (ref 2.5–4.6)

## 2015-05-31 LAB — EXPECTORATED SPUTUM ASSESSMENT W REFEX TO RESP CULTURE

## 2015-05-31 LAB — MAGNESIUM: MAGNESIUM: 2.4 mg/dL (ref 1.7–2.4)

## 2015-05-31 LAB — HEPARIN LEVEL (UNFRACTIONATED): Heparin Unfractionated: 0.5 IU/mL (ref 0.30–0.70)

## 2015-05-31 MED ORDER — ALBUTEROL SULFATE (2.5 MG/3ML) 0.083% IN NEBU: 2.5000 mg | INHALATION_SOLUTION | RESPIRATORY_TRACT | Status: DC | PRN

## 2015-05-31 MED ORDER — CALCIUM GLUCONATE 10 % IV SOLN
1.0000 g | Freq: Once | INTRAVENOUS | Status: AC
  Administered 2015-05-31: 1 g via INTRAVENOUS

## 2015-05-31 MED ORDER — EPINEPHRINE HCL 1 MG/ML IJ SOLN
0.5000 ug/min | INTRAMUSCULAR | Status: DC
  Filled 2015-05-31 (×2): qty 4

## 2015-05-31 MED ORDER — DOPAMINE-DEXTROSE 3.2-5 MG/ML-% IV SOLN
INTRAVENOUS | Status: AC
  Administered 2015-05-31: 800 mg via INTRAVENOUS
  Filled 2015-05-31: qty 250

## 2015-05-31 MED ORDER — ATROPINE SULFATE 1 MG/10ML IJ SOSY
1.0000 mg | PREFILLED_SYRINGE | Freq: Once | INTRAMUSCULAR | Status: AC
  Administered 2015-05-31: 1 mg via INTRAVENOUS

## 2015-05-31 MED ORDER — DOPAMINE-DEXTROSE 3.2-5 MG/ML-% IV SOLN
0.0000 ug/kg/min | INTRAVENOUS | Status: DC
  Administered 2015-05-31: 800 mg via INTRAVENOUS
  Administered 2015-05-31: 20 ug/kg/min via INTRAVENOUS
  Administered 2015-05-31: 15 ug/kg/min via INTRAVENOUS
  Administered 2015-06-01: 6 ug/kg/min via INTRAVENOUS
  Filled 2015-05-31 (×2): qty 250

## 2015-05-31 MED ORDER — CHLORHEXIDINE GLUCONATE 0.12% ORAL RINSE (MEDLINE KIT): 15.0000 mL | Freq: Two times a day (BID) | OROMUCOSAL | Status: DC

## 2015-05-31 MED ORDER — PANTOPRAZOLE SODIUM 40 MG IV SOLR
40.0000 mg | Freq: Every day | INTRAVENOUS | Status: DC
  Administered 2015-05-31 – 2015-06-01 (×2): 40 mg via INTRAVENOUS
  Filled 2015-05-31 (×2): qty 40

## 2015-05-31 MED ORDER — ALBUTEROL SULFATE (2.5 MG/3ML) 0.083% IN NEBU
2.5000 mg | INHALATION_SOLUTION | RESPIRATORY_TRACT | Status: DC | PRN
Start: 2015-05-31 — End: 2015-06-04

## 2015-05-31 MED ORDER — ATROPINE SULFATE 1 MG/10ML IJ SOSY
PREFILLED_SYRINGE | INTRAMUSCULAR | Status: AC
  Administered 2015-05-31 (×2): 0.5 mg
  Filled 2015-05-31: qty 10

## 2015-05-31 MED ORDER — PANTOPRAZOLE SODIUM 40 MG IV SOLR: 40.0000 mg | Freq: Every day | INTRAVENOUS | Status: DC

## 2015-05-31 MED ORDER — ATORVASTATIN CALCIUM 20 MG PO TABS
80.0000 mg | ORAL_TABLET | Freq: Every day | ORAL | Status: DC
  Administered 2015-05-31 – 2015-06-02 (×3): 80 mg
  Filled 2015-05-31 (×3): qty 4

## 2015-05-31 MED ORDER — INSULIN ASPART 100 UNIT/ML ~~LOC~~ SOLN: 8.0000 [IU] | Freq: Once | SUBCUTANEOUS | Status: DC

## 2015-05-31 MED ORDER — ANTISEPTIC ORAL RINSE SOLUTION (CORINZ)
7.0000 mL | Freq: Four times a day (QID) | OROMUCOSAL | Status: DC
  Administered 2015-05-31 – 2015-06-08 (×26): 7 mL via OROMUCOSAL
  Filled 2015-05-31 (×37): qty 7

## 2015-05-31 MED ORDER — AMIODARONE HCL IN DEXTROSE 360-4.14 MG/200ML-% IV SOLN
60.0000 mg/h | INTRAVENOUS | Status: DC
  Filled 2015-05-31: qty 200

## 2015-05-31 MED ORDER — SODIUM CHLORIDE 0.9 % IV SOLN: 2.0000 ug/min | INTRAVENOUS | Status: DC

## 2015-05-31 MED ORDER — FENTANYL BOLUS VIA INFUSION
25.0000 ug | INTRAVENOUS | Status: DC | PRN
  Filled 2015-05-31: qty 25

## 2015-05-31 MED ORDER — NOREPINEPHRINE 4 MG/250ML-% IV SOLN
0.0000 ug/min | INTRAVENOUS | Status: DC
  Administered 2015-05-31: 10 ug/min via INTRAVENOUS
  Administered 2015-05-31: 5 ug/min via INTRAVENOUS
  Filled 2015-05-31 (×2): qty 250

## 2015-05-31 MED ORDER — CLOPIDOGREL BISULFATE 75 MG PO TABS
75.0000 mg | ORAL_TABLET | Freq: Every day | ORAL | Status: DC
  Administered 2015-05-31 – 2015-06-03 (×4): 75 mg
  Filled 2015-05-31 (×4): qty 1

## 2015-05-31 MED ORDER — MIDAZOLAM HCL 2 MG/2ML IJ SOLN
1.0000 mg | INTRAMUSCULAR | Status: DC | PRN
  Filled 2015-05-31: qty 2

## 2015-05-31 MED ORDER — MIDAZOLAM HCL 2 MG/2ML IJ SOLN: 1.0000 mg | INTRAMUSCULAR | Status: DC | PRN

## 2015-05-31 MED ORDER — FENTANYL CITRATE (PF) 100 MCG/2ML IJ SOLN: 50.0000 ug | Freq: Once | INTRAMUSCULAR | Status: DC

## 2015-05-31 MED ORDER — FENTANYL 2500MCG IN NS 250ML (10MCG/ML) PREMIX INFUSION
25.0000 ug/h | INTRAVENOUS | Status: DC
  Administered 2015-05-31: 200 ug/h via INTRAVENOUS
  Administered 2015-05-31 (×2): 100 ug/h via INTRAVENOUS
  Administered 2015-06-01 (×2): 200 ug/h via INTRAVENOUS
  Administered 2015-06-02 – 2015-06-03 (×3): 250 ug/h via INTRAVENOUS
  Filled 2015-05-31 (×6): qty 250

## 2015-05-31 MED ORDER — AMIODARONE HCL IN DEXTROSE 360-4.14 MG/200ML-% IV SOLN
30.0000 mg/h | INTRAVENOUS | Status: DC
  Filled 2015-05-31 (×2): qty 200

## 2015-05-31 MED ORDER — INSULIN ASPART 100 UNIT/ML ~~LOC~~ SOLN
0.0000 [IU] | SUBCUTANEOUS | Status: DC
  Administered 2015-05-31: 5 [IU] via SUBCUTANEOUS
  Administered 2015-06-02 (×3): 3 [IU] via SUBCUTANEOUS
  Administered 2015-06-02 (×3): 2 [IU] via SUBCUTANEOUS
  Administered 2015-06-03 (×2): 3 [IU] via SUBCUTANEOUS
  Administered 2015-06-03: 2 [IU] via SUBCUTANEOUS
  Administered 2015-06-03 – 2015-06-04 (×4): 3 [IU] via SUBCUTANEOUS
  Filled 2015-05-31 (×4): qty 3
  Filled 2015-05-31: qty 2
  Filled 2015-05-31: qty 3
  Filled 2015-05-31: qty 5
  Filled 2015-05-31: qty 2
  Filled 2015-05-31 (×2): qty 3
  Filled 2015-05-31: qty 2
  Filled 2015-05-31: qty 3
  Filled 2015-05-31: qty 2

## 2015-05-31 MED ORDER — INSULIN ASPART 100 UNIT/ML ~~LOC~~ SOLN
0.0000 [IU] | Freq: Three times a day (TID) | SUBCUTANEOUS | Status: DC
  Administered 2015-05-31: 8 [IU] via SUBCUTANEOUS
  Administered 2015-05-31: 11 [IU] via SUBCUTANEOUS
  Filled 2015-05-31 (×2): qty 11
  Filled 2015-05-31: qty 8

## 2015-05-31 MED ORDER — ATROPINE SULFATE 1 MG/10ML IJ SOSY
PREFILLED_SYRINGE | INTRAMUSCULAR | Status: AC
  Administered 2015-05-31: 0.5 mg
  Filled 2015-05-31: qty 10

## 2015-05-31 MED ORDER — BISACODYL 10 MG RE SUPP: 10.0000 mg | Freq: Every day | RECTAL | Status: DC | PRN

## 2015-05-31 MED ORDER — FENTANYL 2500MCG IN NS 250ML (10MCG/ML) PREMIX INFUSION
INTRAVENOUS | Status: AC
  Administered 2015-05-31: 100 ug/h via INTRAVENOUS
  Filled 2015-05-31: qty 250

## 2015-05-31 MED ORDER — SODIUM CHLORIDE 0.9 % IV BOLUS (SEPSIS): 500.0000 mL | Freq: Once | INTRAVENOUS | Status: DC

## 2015-05-31 MED ORDER — CHLORHEXIDINE GLUCONATE 0.12% ORAL RINSE (MEDLINE KIT)
15.0000 mL | Freq: Two times a day (BID) | OROMUCOSAL | Status: DC
  Administered 2015-05-31 – 2015-06-08 (×14): 15 mL via OROMUCOSAL
  Filled 2015-05-31 (×19): qty 15

## 2015-05-31 MED ORDER — ANTISEPTIC ORAL RINSE SOLUTION (CORINZ): 7.0000 mL | Freq: Four times a day (QID) | OROMUCOSAL | Status: DC

## 2015-05-31 MED ORDER — SENNOSIDES 8.8 MG/5ML PO SYRP: 5.0000 mL | ORAL_SOLUTION | Freq: Two times a day (BID) | ORAL | Status: DC | PRN

## 2015-05-31 MED ORDER — ADENOSINE 6 MG/2ML IV SOLN
12.0000 mg | Freq: Once | INTRAVENOUS | Status: AC
  Administered 2015-05-31: 12 mg via INTRAVENOUS

## 2015-05-31 MED ORDER — INSULIN ASPART 100 UNIT/ML ~~LOC~~ SOLN
11.0000 [IU] | Freq: Once | SUBCUTANEOUS | Status: AC
  Administered 2015-05-31: 11 [IU] via SUBCUTANEOUS

## 2015-05-31 MED ORDER — DEXTROSE 5 % IV SOLN
300.0000 mg | Freq: Once | INTRAVENOUS | Status: DC
  Filled 2015-05-31: qty 6

## 2015-05-31 MED ORDER — ATROPINE SULFATE 1 MG/10ML IJ SOSY: 0.5000 mg | PREFILLED_SYRINGE | Freq: Once | INTRAMUSCULAR | Status: DC

## 2015-05-31 MED ORDER — SENNOSIDES-DOCUSATE SODIUM 8.6-50 MG PO TABS
1.0000 | ORAL_TABLET | Freq: Every day | ORAL | Status: DC
Start: 2015-05-31 — End: 2015-05-31

## 2015-05-31 MED ORDER — INSULIN ASPART 100 UNIT/ML ~~LOC~~ SOLN: 2.0000 [IU] | SUBCUTANEOUS | Status: DC

## 2015-05-31 MED ORDER — IPRATROPIUM-ALBUTEROL 0.5-2.5 (3) MG/3ML IN SOLN
3.0000 mL | Freq: Four times a day (QID) | RESPIRATORY_TRACT | Status: DC
  Administered 2015-05-31 – 2015-06-04 (×16): 3 mL via RESPIRATORY_TRACT
  Filled 2015-05-31 (×16): qty 3

## 2015-05-31 MED FILL — Medication: Qty: 1 | Status: AC

## 2015-05-31 NOTE — Progress Notes (Signed)
Pt is able to open eyes and follow some simple commands. Pt remains sedated on Fentanyl. Dopamine and Heparin are also running. Pt is on 80% on the ventilator. Pt is in stable condition at this time.Report given to oncoming nurse.
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
## 2015-05-31 NOTE — ED Provider Notes (Signed)
[place]  Department of Emergency Medicine   Code Blue CONSULT NOTE  Chief Complaint: Cardiac arrest/unresponsive   Level V Caveat: Unresponsive  History of present illness: I was contacted by the hospital for a CODE BLUE cardiac arrest upstairs and presented to the patient's bedside.    ROS: Unable to obtain, Level V caveat  Scheduled Meds: . antiseptic oral rinse  7 mL Mouth Rinse QID  . atorvastatin  80 mg Per Tube QHS  . atropine      . atropine      . calcium gluconate  1 g Intravenous Once  . chlorhexidine gluconate (SAGE KIT)  15 mL Mouth Rinse BID  . clopidogrel  75 mg Per Tube Daily  . collagenase   Topical Daily  . DOPamine      . fentaNYL (SUBLIMAZE) injection  50 mcg Intravenous Once  . fentaNYL      . insulin aspart  2-6 Units Subcutaneous Q4H  . ipratropium-albuterol  3 mL Nebulization Q6H  . mupirocin ointment  1 application Nasal BID  . mupirocin ointment   Nasal BID  . pantoprazole (PROTONIX) IV  40 mg Intravenous Daily  . piperacillin-tazobactam  3.375 g Intravenous Q8H  . sodium chloride  500 mL Intravenous Once  . sodium chloride flush  3 mL Intravenous Q12H  . vancomycin  1,250 mg Intravenous Q18H   Continuous Infusions: . sodium chloride 75 mL/hr at 05/30/15 1415  . fentaNYL infusion INTRAVENOUS    . heparin 1,850 Units/hr (05/30/15 2229)  . norepinephrine     PRN Meds:.acetaminophen **OR** acetaminophen, albuterol, bisacodyl, fentaNYL, midazolam, midazolam, sennosides Past Medical History  Diagnosis Date  . Diabetes mellitus with neuropathy ([place])   . Benign essential HTN   . PVD (peripheral vascular disease) [facility])    Past Surgical History  Procedure Laterality Date  . Peripheral vascular catheterization  01/09/2015    Procedure: Lower Extremity Intervention;  Surgeon: Algernon Huxley, MD;  Location: [place] [facility];  Service: Cardiovascular;;  . Peripheral vascular catheterization N/A 01/09/2015    Procedure: Abdominal  Aortogram w/Lower Extremity;  Surgeon: Algernon Huxley, MD;  Location: [place] [facility];  Service: Cardiovascular;  Laterality: N/A;   Social History   Social History  . Marital Status: Single    Spouse Name: N/A  . Number of Children: N/A  . Years of Education: N/A   Occupational History  . Not on file.   Social History Main Topics  . Smoking status: Former Research scientist (life sciences)  . Smokeless tobacco: Not on file  . Alcohol Use: Not on file  . Drug Use: Not on file  . Sexual Activity: Not on file   Other Topics Concern  . Not on file   Social History Narrative   No Known Allergies  Last set of Vital Signs (not current) Filed Vitals:   05/30/15 1945 05/31/15 0321  BP: 121/59 207/101  Pulse: 85 50  Temp: 97.6 F (36.4 C)   Resp: 20       Physical Exam  Gen: unresponsive Cardiovascular: pulseless  Resp: apneic. Breath sounds equal bilaterally with bagging  Abd: nondistended  Neuro: GCS 3, unresponsive to pain  HEENT: No blood in posterior pharynx, gag reflex absent  Neck: No crepitus  Musculoskeletal: No deformity  Skin: warm  Procedures  INTUBATION Performed by: Lisa Roca Required items: required blood products, implants, devices, and special equipment available Patient identity confirmed: provided demographic data and hospital-assigned identification number Time out: Immediately prior to  procedure a "time out" was called to verify the correct patient, procedure, equipment, support staff and site/side marked as required. Indications: Cardiac and respiratory arrest  Intubation method: Direct laryngoscopy Preoxygenation: BVM Sedatives: None  Paralytic: None  Tube Size: 8-0 cuffed Post-procedure assessment: chest rise and ETCO2 monitor Breath sounds: equal and absent over the epigastrium Tube secured by Respiratory Therapy Patient tolerated the procedure well with no immediate complications.  CRITICAL CARE Performed by: Lisa Roca Total critical care time: 30  minutes Critical care time was exclusive of separately billable procedures and treating other patients. Critical care was necessary to treat or prevent imminent or life-threatening deterioration. Critical care was time spent personally by me on the following activities: development of treatment plan with patient and/or surrogate as well as nursing, discussions with consultants, evaluation of patient's response to treatment, examination of patient, obtaining history from patient or surrogate, ordering and performing treatments and interventions, ordering and review of laboratory studies, ordering and review of radiographic studies, pulse oximetry and re-evaluation of patient's condition.  Cardiopulmonary Resuscitation (CPR) Procedure Note  Directed/Performed by: Lisa Roca I personally directed ancillary staff and/or performed CPR in an effort to regain return of spontaneous circulation and to maintain cardiac, neuro and systemic perfusion.    Medical Decision making  I arrived in the nurse reported the patient was here with sepsis, had an episode of atrial fibrillation which was new, and then she noticed on the monitor that he was bradycardic and went to assess him and he was unresponsive, found to have no pulse and CPR was initiated with chest compressions. Patient was confirmed to have no pulse, bradycardic in the 50s rhythm seen on monitor. CPR was continued with chest compressions and epinephrine. Fluids were wide open bolus.  Intubation with direct laryngoscopy took several at times due to deep and floppy epiglottis, but was up obtained with a Mac 3 and 8-oh tube.  Management of the CPR was taken over by ICU nurse practitioner.    Assessment and Plan  Cardiopulmonary arrest, additional ongoing CPR taken over by ICU nurse practitioner.    Lisa Roca, MD 05/31/15 [phone]

## 2015-05-31 NOTE — Procedures (Signed)
Central Venous Catheter Insertion Procedure Note Winfield RastDavid Leaver [id number] 12/19/1950  Procedure: Insertion of Central Venous Catheter Indications: Assessment of intravascular volume, Drug and/or fluid administration and Frequent blood sampling  Procedure Details Consent: Risks of procedure as well as the alternatives and risks of each were explained to the (patient/caregiver).  Consent for procedure obtained. Time Out: Verified patient identification, verified procedure, site/side was marked, verified correct patient position, special equipment/implants available, medications/allergies/relevent history reviewed, required imaging and test results available.  Performed  Maximum sterile technique was used including antiseptics, cap, gloves, gown, hand hygiene, mask and sheet. Skin prep: Chlorhexidine; local anesthetic administered A antimicrobial bonded/coated triple lumen catheter was placed in the right internal jugular vein using the Seldinger technique.  Evaluation Blood flow good Complications: No apparent complications Patient did tolerate procedure well. Chest X-ray ordered to verify placement.  CXR: normal.  Procedure performed under direct supervision of Dr. Nicholos Johnsamachandran. Ultrasound utilized for realtime vessel cannulation  Magdalene S. Tukov ANP-BC Pulmonary and Critical Care Medicine [facility] Pager [phone] or [phone]  05/31/2015, 5:18 AM  I was not physically present for this procedure, I agree with need for placement of TLC catheter in this patient.  05/31/2015

## 2015-05-31 NOTE — Progress Notes (Signed)
Around 0251 received a called from central tele that patient had converted to Atrial fibrillation, this nurse verified that patient was in A-fib with HR 117-119. MD Pyreddy notified. Minutes later, this nurse noted on monitor that pt's HR was 56 and went back to pt's room to make sure pt was okay, pt had a pulse but was unresponsive. A code blue was called. Refer to code blue sheet.

## 2015-05-31 NOTE — Progress Notes (Signed)
Code blue called. Patient was unresponsive and in asystole and full ACLS protocol was run. Patient received epinephrine and iv bicarbonate and cpr and iv fluids. Electively intubated and put on ventilator. Blood pressure and pulse was revived.Patient transferred to ICU and care taken over by critical care nurse practitoner. Intensivist consult ordered. Family informed.

## 2015-05-31 NOTE — Progress Notes (Signed)
ANTICOAGULATION CONSULT NOTE -FOLLOW UP   Pharmacy Consult for Heparin Indication: chest pain/ACS  No Known Allergies  Patient Measurements: Height: 5' 10" (177.8 cm) Weight: 185 lb 1.6 oz (83.961 kg) IBW/kg (Calculated) : 73 Heparin Dosing Weight: 84.8 kg  Vital Signs: Temp: 97.6 F (36.4 C) (05/12 1945) Temp Source: Oral (05/12 1945) BP: 207/101 mmHg (05/13 0321) Pulse Rate: 50 (05/13 0321)  Labs:  Recent Labs  05/21/2015 1210  05/30/15 0439 05/30/15 1153 05/30/15 2125 05/31/15 0417  HGB 9.2*  --  9.5*  --   --  9.3*  HCT 28.7*  --  29.3*  --   --  29.7*  PLT 215  --  193  --   --  215  APTT 37*  --   --   --   --   --   LABPROT 15.6*  --  14.2  --   --  14.0  INR 1.22  --  1.08  --   --  1.06  HEPARINUNFRC  --   < > 0.15* 0.17* 0.47 0.50  CREATININE 1.63*  --  1.42*  --   --   --   TROPONINI 1.85*  --   --  0.65*  --   --   < > = values in this interval not displayed.  Estimated Creatinine Clearance: 52.8 mL/min (by C-G formula based on Cr of 1.42).   Medical History: Past Medical History  Diagnosis Date  . Diabetes mellitus with neuropathy ([place])   . Benign essential HTN   . PVD (peripheral vascular disease) (H[facility])     Medications:  Prescriptions prior to admission  Medication Sig Dispense Refill Last Dose  . albuterol (ACCUNEB) 0.63 MG/3ML nebulizer solution Inhale 3 mLs into the lungs every 6 (six) hours as needed for wheezing or shortness of breath.    PRN  . aspirin EC 81 MG tablet Take 81 mg by mouth daily.   01/07/2015 at am  . atorvastatin (LIPITOR) 80 MG tablet Take 80 mg by mouth at bedtime.   01/06/2015 at pm  . clopidogrel (PLAVIX) 75 MG tablet Take 75 mg by mouth daily.   01/07/2015 at am  . collagenase (SANTYL) ointment Apply topically daily. 15 g 0   . insulin glargine (LANTUS) 100 UNIT/ML injection Inject 60 Units into the skin at bedtime.   01/07/2015 at pm  . piperacillin-tazobactam (ZOSYN) 3.375 GM/50ML IVPB Inject 50 mLs (3.375 g total)  into the vein every 8 (eight) hours. 500 mL 0   . senna-docusate (SENOKOT-S) 8.6-50 MG tablet Take 1 tablet by mouth daily.   01/07/2015 at pm   Scheduled:  . antiseptic oral rinse  7 mL Mouth Rinse QID  . atorvastatin  80 mg Per Tube QHS  . atropine      . atropine      . calcium gluconate  1 g Intravenous Once  . chlorhexidine gluconate (SAGE KIT)  15 mL Mouth Rinse BID  . clopidogrel  75 mg Per Tube Daily  . collagenase   Topical Daily  . DOPamine      . fentaNYL (SUBLIMAZE) injection  50 mcg Intravenous Once  . fentaNYL      . insulin aspart  2-6 Units Subcutaneous Q4H  . ipratropium-albuterol  3 mL Nebulization Q6H  . mupirocin ointment  1 application Nasal BID  . mupirocin ointment   Nasal BID  . pantoprazole (PROTONIX) IV  40 mg Intravenous Daily  . piperacillin-tazobactam  3.375 g Intravenous Q8H  .  sodium chloride  500 mL Intravenous Once  . sodium chloride flush  3 mL Intravenous Q12H  . vancomycin  1,250 mg Intravenous Q18H   Infusions:  . sodium chloride 75 mL/hr at 05/30/15 1415  . fentaNYL infusion INTRAVENOUS    . heparin 1,850 Units/hr (05/30/15 2229)  . norepinephrine      Assessment: 65 y/o M admitted with cellulitis and NSTEMI. Patient reports taking Plavix but no blood thinners.   5/12 0500 HL resulted at 0.15 5/12 ~12:00 HL resulted @ 0.17  Goal of Therapy:  Heparin level 0.3-0.7 units/ml Monitor platelets by anticoagulation protocol: Yes   Plan:  Heparin level therapeutic. Continue current rate. Pharmacy will continue to monitor daily.  Laural Benes, Pharm.D., BCPS Clinical Pharmacist 05/31/2015 5:29 AM

## 2015-05-31 NOTE — Progress Notes (Signed)
Patient rhythm change from SB with rate in 30-40s to Wide complex sinus tach rate 115. Patient has 2+ carotid pulse. Dr Ardyth Manam aware with no new orders at this time.

## 2015-05-31 NOTE — Progress Notes (Signed)
Nutrition Follow-up  DOCUMENTATION CODES:   Not applicable  INTERVENTION:   -Spoke with Dr. Nicholos Johnsamachandran this am, agreeable to hold of on EN currently. Recommend starting EN within 24-48 hours of intubation as medically able. Will continue to assess.   NUTRITION DIAGNOSIS:   Increased nutrient needs related to wound healing as evidenced by estimated needs.  GOAL:   Patient will meet greater than or equal to 90% of their needs  MONITOR:   PO intake, Skin, Weight trends, Labs, I & O's  REASON FOR ASSESSMENT:   Ventilator    ASSESSMENT:   Pt admitted with urosepsis as well as chronic lower extremity diabetic foot ulcers. Podiatry consult placed; per MD note likely surgery tomorrow for I&D and possible amputations.  Pt unresponsive with a pulse this am times two; s/p 2 Code blue with chest compressions this morning. Pt intubated on the vent currently.   Diet Order:  Diet NPO time specified   Pt remains on isolation. RD visited pt while eating fish, mashed potatoes and fruit yesterday afternoon for lunch. Limited documentation as pt on isolation for dinner last night.   Gastrointestinal Profile: Last BM: 05/30/2015  Medications: Fentanyl, Protonix, SS Novolog, Dopamine, NS at 8575mL/hr, Heparin   Electrolyte/Renal Profile and Glucose Profile:   Recent Labs Lab 05/23/2015 1210 05/30/15 0439 05/31/15 0417  NA 137 140 140  K 4.6 4.1 4.8  CL 99* 103 103  CO2 30 31 27   BUN 47* 54* 56*  CREATININE 1.63* 1.42* 1.53*  CALCIUM 8.4* 8.1* 8.3*  MG  --   --  2.4  PHOS  --   --  5.3*  GLUCOSE 357* 352* 421*     Weight Trend since Admission: Filed Weights   05/19/2015 1155 05/23/2015 1732  Weight: 186 lb 14.4 oz (84.777 kg) 185 lb 1.6 oz (83.961 kg)     Skin:   (Multiple Stage III and Unstageable pressure uclers of foot)   BMI:  Body mass index is 26.56 kg/(m^2).    Estimated Nutritional Needs:   Kcal:  will assess once pt on vent >24 hours  Protein:  168-210g  protein (2-2.5g/kg using current wt of 84kg)  Fluid:  >2L fluid  EDUCATION NEEDS:   Education needs no appropriate at this time  Leda QuailAllyson Kairav Russomanno, RD, LDN Pager [phone] Weekend/On-Call Pager [phone]

## 2015-05-31 NOTE — Progress Notes (Signed)
Patient in SVT with HR 150s at time of report, patient with pulse. Dr Ardyth Manam at bedside, ordered and administered adenosine with brief return of NSR. HR climbed back to rate 140s, went into asystole, no pulse, CODE BLUE called, compressions started. Ran code blue until ROSC. Family decided patient to be DNR from this point forward. Patient currently unresponsive, SB on monitor with HR in 30s and 40s, unresponsive to atropine. Will continue to monitor.

## 2015-05-31 NOTE — Progress Notes (Signed)
Patient CODE BLUE around 0800am, unresponsive to stimuli until 1240, patient grimacing to painful stimuli. Not following commands at this time, pupils appear to be 5mm and unreactive to light. VSS at this time, on dopamine, heparin, and fentanyl drips. Will continue to monitor.

## 2015-06-01 ENCOUNTER — Encounter: Admission: EM | Disposition: E | Payer: Self-pay | Source: Home / Self Care | Attending: Internal Medicine

## 2015-06-01 ENCOUNTER — Inpatient Hospital Stay: Payer: Medicare Other

## 2015-06-01 LAB — PHOSPHORUS: Phosphorus: 2.5 mg/dL (ref 2.5–4.6)

## 2015-06-01 LAB — BLOOD GAS, ARTERIAL
Acid-Base Excess: 9.7 mmol/L — ABNORMAL HIGH (ref 0.0–3.0)
Allens test (pass/fail): POSITIVE — AB
BICARBONATE: 34.3 meq/L — AB (ref 21.0–28.0)
FIO2: 0.6
LHR: 18 {breaths}/min
MECHANICAL RATE: 18
O2 Saturation: 98.8 %
PATIENT TEMPERATURE: 37
PCO2 ART: 45 mmHg (ref 32.0–48.0)
PEEP: 10 cmH2O
VT: 550 mL
pH, Arterial: 7.49 — ABNORMAL HIGH (ref 7.350–7.450)
pO2, Arterial: 114 mmHg — ABNORMAL HIGH (ref 83.0–108.0)

## 2015-06-01 LAB — BASIC METABOLIC PANEL
ANION GAP: 7 (ref 5–15)
BUN: 56 mg/dL — ABNORMAL HIGH (ref 6–20)
CALCIUM: 7.9 mg/dL — AB (ref 8.9–10.3)
CO2: 31 mmol/L (ref 22–32)
Chloride: 106 mmol/L (ref 101–111)
Creatinine, Ser: 1.43 mg/dL — ABNORMAL HIGH (ref 0.61–1.24)
GFR calc non Af Amer: 50 mL/min — ABNORMAL LOW (ref >60)
GFR, EST AFRICAN AMERICAN: 57 mL/min — AB (ref >60)
Glucose, Bld: 120 mg/dL — ABNORMAL HIGH (ref 65–99)
POTASSIUM: 3.7 mmol/L (ref 3.5–5.1)
Sodium: 144 mmol/L (ref 135–145)

## 2015-06-01 LAB — GLUCOSE, CAPILLARY
GLUCOSE-CAPILLARY: 113 mg/dL — AB (ref 65–99)
GLUCOSE-CAPILLARY: 116 mg/dL — AB (ref 65–99)
GLUCOSE-CAPILLARY: 58 mg/dL — AB (ref 65–99)
GLUCOSE-CAPILLARY: 60 mg/dL — AB (ref 65–99)
Glucose-Capillary: 61 mg/dL — ABNORMAL LOW (ref 65–99)
Glucose-Capillary: 82 mg/dL (ref 65–99)
Glucose-Capillary: 100 mg/dL — ABNORMAL HIGH (ref 65–99)
Glucose-Capillary: 98 mg/dL (ref 65–99)
Glucose-Capillary: 110 mg/dL — ABNORMAL HIGH (ref 65–99)

## 2015-06-01 LAB — HEPARIN LEVEL (UNFRACTIONATED): HEPARIN UNFRACTIONATED: 0.52 [IU]/mL (ref 0.30–0.70)

## 2015-06-01 LAB — CBC
HEMATOCRIT: 26.8 % — AB (ref 40.0–52.0)
HEMOGLOBIN: 8.6 g/dL — AB (ref 13.0–18.0)
MCH: 27.9 pg (ref 26.0–34.0)
MCHC: 32.1 g/dL (ref 32.0–36.0)
MCV: 86.7 fL (ref 80.0–100.0)
Platelets: 166 10*3/uL (ref 150–440)
RBC: 3.09 MIL/uL — ABNORMAL LOW (ref 4.40–5.90)
RDW: 16 % — ABNORMAL HIGH (ref 11.5–14.5)
WBC: 12 10*3/uL — ABNORMAL HIGH (ref 3.8–10.6)

## 2015-06-01 LAB — MAGNESIUM: Magnesium: 2.5 mg/dL — ABNORMAL HIGH (ref 1.7–2.4)

## 2015-06-01 LAB — VANCOMYCIN, TROUGH: Vancomycin Tr: 26 ug/mL (ref 10–20)

## 2015-06-01 SURGERY — IRRIGATION AND DEBRIDEMENT FOOT
Anesthesia: Choice | Laterality: Left

## 2015-06-01 MED ORDER — DEXTROSE 50 % IV SOLN
INTRAVENOUS | Status: AC
  Filled 2015-06-01: qty 50

## 2015-06-01 MED ORDER — DEXTROSE 50 % IV SOLN
1.0000 | Freq: Once | INTRAVENOUS | Status: AC
  Administered 2015-06-01: 50 mL via INTRAVENOUS

## 2015-06-01 MED ORDER — VITAL HIGH PROTEIN PO LIQD
1000.0000 mL | ORAL | Status: DC
  Administered 2015-06-01 – 2015-06-02 (×2): 1000 mL

## 2015-06-01 MED ORDER — DEXTROSE 5 % IV SOLN
INTRAVENOUS | Status: DC
  Administered 2015-06-01: 16:00:00 via INTRAVENOUS

## 2015-06-01 MED ORDER — DEXTROSE 50 % IV SOLN
25.0000 mL | Freq: Once | INTRAVENOUS | Status: AC
  Administered 2015-06-01: 25 mL via INTRAVENOUS

## 2015-06-01 MED FILL — Medication: Qty: 1 | Status: AC

## 2015-06-01 SURGICAL SUPPLY — 47 items
BAG COUNTER SPONGE EZ (MISCELLANEOUS) IMPLANT
BANDAGE ELASTIC 3 LF NS (GAUZE/BANDAGES/DRESSINGS) IMPLANT
BANDAGE ELASTIC 4 LF NS (GAUZE/BANDAGES/DRESSINGS) IMPLANT
BANDAGE STRETCH 3X4.1 STRL (GAUZE/BANDAGES/DRESSINGS) IMPLANT
BLADE MED AGGRESSIVE (BLADE) IMPLANT
BLADE SURG 15 STRL LF DISP TIS (BLADE) IMPLANT
BLADE SURG 15 STRL SS (BLADE)
BNDG ESMARK 4X12 TAN STRL LF (GAUZE/BANDAGES/DRESSINGS) IMPLANT
BNDG GAUZE 4.5X4.1 6PLY STRL (MISCELLANEOUS) IMPLANT
CANISTER SUCT 1200ML W/VALVE (MISCELLANEOUS) IMPLANT
COUNTER SPONGE BAG EZ (MISCELLANEOUS)
CUFF TOURN 18 STER (MISCELLANEOUS) IMPLANT
CUFF TOURN DUAL PL 12 NO SLV (MISCELLANEOUS) IMPLANT
DRAPE FLUOR MINI C-ARM 54X84 (DRAPES) IMPLANT
DURAPREP 26ML APPLICATOR (WOUND CARE) IMPLANT
ELECT REM PT RETURN 9FT ADLT (ELECTROSURGICAL)
ELECTRODE REM PT RTRN 9FT ADLT (ELECTROSURGICAL) IMPLANT
GAUZE PETRO XEROFOAM 1X8 (MISCELLANEOUS) IMPLANT
GAUZE SPONGE 4X4 12PLY STRL (GAUZE/BANDAGES/DRESSINGS) IMPLANT
GLOVE BIO SURGEON STRL SZ8 (GLOVE) IMPLANT
GLOVE INDICATOR 7.5 STRL GRN (GLOVE) IMPLANT
GOWN STRL REUS W/ TWL LRG LVL3 (GOWN DISPOSABLE) IMPLANT
GOWN STRL REUS W/TWL LRG LVL3 (GOWN DISPOSABLE)
KIT RM TURNOVER STRD PROC AR (KITS) IMPLANT
LABEL OR SOLS (LABEL) IMPLANT
NDL SAFETY 18GX1.5 (NEEDLE) IMPLANT
NEEDLE FILTER BLUNT 18X 1/2SAF (NEEDLE)
NEEDLE FILTER BLUNT 18X1 1/2 (NEEDLE) IMPLANT
NEEDLE HYPO 25X1 1.5 SAFETY (NEEDLE) IMPLANT
NS IRRIG 500ML POUR BTL (IV SOLUTION) IMPLANT
PACK EXTREMITY ARMC (MISCELLANEOUS) IMPLANT
PENCIL ELECTRO HAND CTR (MISCELLANEOUS) IMPLANT
RASP SM TEAR CROSS CUT (RASP) IMPLANT
STOCKINETTE STRL 6IN 960660 (GAUZE/BANDAGES/DRESSINGS) IMPLANT
SUT ETH BLK MONO 3 0 FS 1 12/B (SUTURE) IMPLANT
SUT ETHILON 3-0 FS-10 30 BLK (SUTURE)
SUT ETHILON 4-0 (SUTURE)
SUT ETHILON 4-0 FS2 18XMFL BLK (SUTURE)
SUT ETHILON 5 0 PS 2 18 (SUTURE) IMPLANT
SUT VIC AB 3-0 SH 27 (SUTURE)
SUT VIC AB 3-0 SH 27X BRD (SUTURE) IMPLANT
SUT VIC AB 4-0 FS2 27 (SUTURE) IMPLANT
SUTURE EHLN 3-0 FS-10 30 BLK (SUTURE) IMPLANT
SUTURE ETHLN 4-0 FS2 18XMF BLK (SUTURE) IMPLANT
SWAB CULTURE AMIES ANAERIB BLU (MISCELLANEOUS) IMPLANT
SYR 3ML LL SCALE MARK (SYRINGE) IMPLANT
SYRINGE 10CC LL (SYRINGE) IMPLANT
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
## 2015-06-01 NOTE — Progress Notes (Signed)
RN notified Dr Ardyth Manam that patient's fsbs this am was 60, recheck 58, per protocol patient received 1 amp of D50 with improvement fsbs to 116.  RN wanted to know if MD wanted to add D5 since patient fsbs has a trend of being low yesterday as well.  MD states will attempt to start tube feeding.  MD aware that patient's been having coffee ground emesis via OG low intermittent suction.  MD and RN looked at patient's hbg together.  MD states "I will put in orders"

## 2015-06-01 NOTE — Progress Notes (Signed)
   06/13/2015 0844  Vent Select  Invasive or Noninvasive Invasive  Adult Vent Y  Airway 8 mm  Placement Date/Time: 05/31/15 0315   Placed By: ED Physician  Airway Device: Endotracheal Tube  ETT Types: Oral  Size (mm): 8 mm  Cuffed: Cuffed  Airway Equipment: Stylet  Placement Confirmation: Direct Visualization;Bilateral Breath Sounds;ETCO2 (Cap...  Secured at (cm) 25 cm  Measured From Lips  Secured Location Left  Secured By Publishing rights managerCommercial Tube Holder  Adult Ventilator Settings  Vent Type Servo i  Humidity HME  Vent Mode PRVC  Vt Set 550 mL  Set Rate 18 bmp  FiO2 (%) 40 %  I Time 1 Sec(s)  PEEP 8 cmH20  Adult Ventilator Measurements  Peak Airway Pressure 30 L/min  Mean Airway Pressure 16 cmH20  Resp Rate Spontaneous 7 br/min  Resp Rate Total 25 br/min  Exhaled Vt 625 mL  Measured Ve 13.6 mL  I:E Ratio Measured 1:1.4  End Tidal CO2 34  SpO2 99 %  Adult Ventilator Alarms  Alarms On Y  Ve High Alarm 20 L/min  Ve Low Alarm 5 L/min  Resp Rate High Alarm 38 br/min  Resp Rate Low Alarm 5  PEEP Low Alarm 2 cmH2O  Press High Alarm 50 cmH2O  Breath Sounds  Bilateral Breath Sounds Clear  Placed patient on psv 5/5 resulting in patients RR increasing to 38 and pulse oximetry of 87.  Returned patient to previous settings.  RN at bedside.  Will inform MD.

## 2015-06-01 NOTE — Progress Notes (Signed)
Brief Nutrition Note  Consult received for enteral/tube feeding initiation and management.  Adult Enteral Nutrition Protocol initiated with Vital High Protein.  Admitting Dx: NSTEMI (non-ST elevated myocardial infarction) (HCC) [I21.4] Cellulitis of left lower extremity [L03.116] Cellulitis of right lower extremity [L03.115] Sepsis, due to unspecified organism (HCC) [A41.9]  Body mass index is 26.56 kg/(m^2).   Labs:   Recent Labs Lab 05/30/15 0439 05/31/15 0417 06/03/2015 0420  NA 140 140 144  K 4.1 4.8 3.7  CL 103 103 106  CO2 31 27 31   BUN 54* 56* 56*  CREATININE 1.42* 1.53* 1.43*  CALCIUM 8.1* 8.3* 7.9*  MG  --  2.4 2.5*  PHOS  --  5.3* 2.5  GLUCOSE 352* 421* 120*    Ariyon Mittleman Shelva MajesticWest, RD, LDN Pager [phone] Weekend/On-Call Pager [phone]

## 2015-06-01 NOTE — Progress Notes (Signed)
RN notified MD to clarify patient's code status, MD states "I'll change it back to full code"

## 2015-06-01 NOTE — Progress Notes (Signed)
Patient Demographics  Johnny Lane, is a 65 y.o. male   MRN: [id number]   DOB - [birth year]  Admit Date - 05/28/2015    Outpatient Primary MD for the patient is [name],[name], MD  With History of -  Past Medical History  Diagnosis Date  . Diabetes mellitus with neuropathy ([place])   . Benign essential HTN   . PVD (peripheral vascular disease) [facility])       Past Surgical History  Procedure Laterality Date  . Peripheral vascular catheterization  01/09/2015    Procedure: Lower Extremity Intervention;  Surgeon: Algernon Huxley, MD;  Location: [facility];  Service: Cardiovascular;;  . Peripheral vascular catheterization N/A 01/09/2015    Procedure: Abdominal Aortogram w/Lower Extremity;  Surgeon: Algernon Huxley, MD;  Location: [facility];  Service: Cardiovascular;  Laterality: N/A;    in for   Chief Complaint  Patient presents with  . Altered Mental Status     HPI  Johnny Lane  is a 65 y.o. male, Patient said chronic ulcerations on his left foot for the last 8 months. Recently developed more purulent drainage with infection to the soft tissues. He was scheduled for surgery yesterday morning for incision and drainage on the infected ulcers on his left foot. We were unable to do this due to the fact that he he coded on 2022/03/29 night and again Saturday morning. He has improved since yesterday but is still not a surgical candidate at this timeframe.    Review of Systems  patient is currently on a ventilator but is responsive.  Social History Social History  Substance Use Topics  . Smoking status: Former Research scientist (life sciences)  . Smokeless tobacco: Not on file  . Alcohol Use: Not on file     Family History History reviewed. No pertinent family history.   Prior to Admission medications   Medication Sig Start Date End  Date Taking? Authorizing Provider  albuterol (ACCUNEB) 0.63 MG/3ML nebulizer solution Inhale 3 mLs into the lungs every 6 (six) hours as needed for wheezing or shortness of breath.     Historical Provider, MD  aspirin EC 81 MG tablet Take 81 mg by mouth daily.    Historical Provider, MD  atorvastatin (LIPITOR) 80 MG tablet Take 80 mg by mouth at bedtime.    Historical Provider, MD  clopidogrel (PLAVIX) 75 MG tablet Take 75 mg by mouth daily.    Historical Provider, MD  collagenase (SANTYL) ointment Apply topically daily. 01/14/15   Demetrios Loll, MD  insulin glargine (LANTUS) 100 UNIT/ML injection Inject 60 Units into the skin at bedtime.    Historical Provider, MD  piperacillin-tazobactam (ZOSYN) 3.375 GM/50ML IVPB Inject 50 mLs (3.375 g total) into the vein every 8 (eight) hours. 01/14/15   Demetrios Loll, MD  senna-docusate (SENOKOT-S) 8.6-50 MG tablet Take 1 tablet by mouth daily.    Historical Provider, MD    Anti-infectives    Start     Dose/Rate Route Frequency Ordered Stop   05/30/15 1300  vancomycin (VANCOCIN) 1,250 mg in sodium chloride 0.9 % 250 mL IVPB     1,250 mg 166.7 mL/hr over 90 Minutes Intravenous Every 18 hours 05/30/15 1247  06/11/2015 2100  piperacillin-tazobactam (ZOSYN) IVPB 3.375 g     3.375 g 12.5 mL/hr over 240 Minutes Intravenous Every 8 hours 05/24/2015 1726     05/20/2015 1245  piperacillin-tazobactam (ZOSYN) IVPB 3.375 g     3.375 g 100 mL/hr over 30 Minutes Intravenous  Once 06/04/2015 1235 06/17/2015 1430   06/12/2015 1245  vancomycin (VANCOCIN) IVPB 1000 mg/200 mL premix     1,000 mg 200 mL/hr over 60 Minutes Intravenous  Once 05/28/2015 1235 06/15/2015 1622      Scheduled Meds: . antiseptic oral rinse  7 mL Mouth Rinse QID  . atorvastatin  80 mg Per Tube QHS  . chlorhexidine gluconate (SAGE KIT)  15 mL Mouth Rinse BID  . clopidogrel  75 mg Per Tube Daily  . collagenase   Topical Daily  . dextrose      . fentaNYL (SUBLIMAZE) injection  50 mcg Intravenous Once  .  insulin aspart  0-15 Units Subcutaneous Q4H  . ipratropium-albuterol  3 mL Nebulization Q6H  . mupirocin ointment  1 application Nasal BID  . pantoprazole (PROTONIX) IV  40 mg Intravenous Daily  . piperacillin-tazobactam  3.375 g Intravenous Q8H  . sodium chloride  500 mL Intravenous Once  . sodium chloride flush  3 mL Intravenous Q12H  . vancomycin  1,250 mg Intravenous Q18H   Continuous Infusions: . DOPamine 6 mcg/kg/min (06/07/2015 0928)  . epinephrine    . fentaNYL infusion INTRAVENOUS 300 mcg/hr (05/28/2015 1011)  . heparin 1,850 Units/hr (06/17/2015 0023)  . norepinephrine Stopped (05/31/15 5400)  Lower Extremity exam:Dressings changed today.Wet-to-dry saline packing was used to pack both the first metatarsal head area and also the plantar surface wound. Still heavy drainage from the region but no overt cellulitis. White count below shows significant improvement over the last 2 days.   Data Review  CBC  Recent Labs Lab 05/22/2015 1210 05/30/15 0439 05/31/15 0417 06/18/2015 0420  WBC 19.7* 21.0* 24.6* 12.0*  HGB 9.2* 9.5* 9.3* 8.6*  HCT 28.7* 29.3* 29.7* 26.8*  PLT 215 193 215 166  MCV 85.6 87.2 88.5 86.7  MCH 27.5 28.2 27.6 27.9  MCHC 32.1 32.3 31.2* 32.1  RDW 16.1* 15.8* 16.2* 16.0*  LYMPHSABS 0.5*  --   --   --   MONOABS 0.8  --   --   --   EOSABS 0.0  --   --   --   BASOSABS 0.0  --   --   --    ------------------------------------------------------------------------------------------------------------------  Chemistries   Recent Labs Lab 05/25/2015 1210 05/30/15 0439 05/31/15 0417 05/20/2015 0420  NA 137 140 140 144  K 4.6 4.1 4.8 3.7  CL 99* 103 103 106  CO2 30 31 27 31   GLUCOSE 357* 352* 421* 120*  BUN 47* 54* 56* 56*  CREATININE 1.63* 1.42* 1.53* 1.43*  CALCIUM 8.4* 8.1* 8.3* 7.9*  MG  --   --  2.4 2.5*  AST 143* 64*  --   --   ALT 77* 71*  --   --   ALKPHOS 55 49  --   --   BILITOT 1.1 0.9  --   --    -------- Coagulation profile  Recent Labs Lab  06/13/2015 1210 05/30/15 0439 05/31/15 0417  INR 1.22 1.08 1.06   ---------------------------------------------------------------------------------------------------------------  Urinalysis    Component Value Date/Time   COLORURINE AMBER* 05/28/2015 1429   APPEARANCEUR CLOUDY* 06/16/2015 1429   LABSPEC 1.023 06/18/2015 1429   PHURINE 5.0 06/02/2015 1429   GLUCOSEU 50*  06/09/2015 1429   HGBUR 2+* 06/18/2015 1429   BILIRUBINUR NEGATIVE 05/23/2015 1429   KETONESUR NEGATIVE 06/10/2015 1429   PROTEINUR >500* 05/25/2015 1429   NITRITE NEGATIVE 06/04/2015 1429   LEUKOCYTESUR NEGATIVE 05/22/2015 1429     Imaging results:   Mr Foot Left Wo Contrast  05/30/2015  CLINICAL DATA:  Left foot ulcerations for many months. Question osteomyelitis or abscess. Subsequent encounter. EXAM: MRI OF THE LEFT FOREFOOT WITHOUT CONTRAST TECHNIQUE: Multiplanar, multisequence MR imaging was performed. No intravenous contrast was administered. COMPARISON:  Plain films left foot 06/14/2015 and MRI left foot 01/14/2015. FINDINGS: Large ulcerations and/or areas of debridement are seen along the first MTP joint and proximal phalanx of the great toe and plantar surface of the midfoot. No bone marrow signal abnormality to suggest osteomyelitis is identified. No evidence of septic joint is seen. There is intense subcutaneous edema about the foot which is worst over the dorsum and increased since the prior exam. Intrinsic musculature of the foot is atrophied. The Achilles tendon is markedly thickened with intrasubstance increased T2 signal consistent with severe tendinopathy. IMPRESSION: Large skin ulcerations and areas of debridement on the left foot. Negative for osteomyelitis or septic joint. Intense subcutaneous edema over the dorsum of the foot is worse than on the prior examination and could be due to cellulitis and/or dependent change. No abscess is seen. Severe appearing Achilles tendinopathy. Electronically Signed   By:  Inge Rise M.D.   On: 05/30/2015 15:43   US Venous Img Lower Bilateral  05/30/2015  CLINICAL DATA:  65 year old male with a history of leg pain EXAM: BILATERAL LOWER EXTREMITY VENOUS DOPPLER ULTRASOUND TECHNIQUE: Gray-scale sonography with graded compression, as well as color Doppler and duplex ultrasound were performed to evaluate the lower extremity deep venous systems from the level of the common femoral vein and including the common femoral, femoral, profunda femoral, popliteal and calf veins including the posterior tibial, peroneal and gastrocnemius veins when visible. The superficial great saphenous vein was also interrogated. Spectral Doppler was utilized to evaluate flow at rest and with distal augmentation maneuvers in the common femoral, femoral and popliteal veins. COMPARISON:  None. FINDINGS: RIGHT LOWER EXTREMITY Common Femoral Vein: No evidence of thrombus. Normal compressibility, respiratory phasicity and response to augmentation. Saphenofemoral Junction: No evidence of thrombus. Normal compressibility and flow on color Doppler imaging. Profunda Femoral Vein: No evidence of thrombus. Normal compressibility and flow on color Doppler imaging. Femoral Vein: No evidence of thrombus. Normal compressibility, respiratory phasicity and response to augmentation. Popliteal Vein: No evidence of thrombus. Normal compressibility, respiratory phasicity and response to augmentation. Calf Veins: No evidence of thrombus. Normal compressibility and flow on color Doppler imaging. Superficial Great Saphenous Vein: No evidence of thrombus. Normal compressibility and flow on color Doppler imaging. Other Findings:  Edema of the lower extremity. Borderline enlarged lymph nodes of the inguinal region. LEFT LOWER EXTREMITY Common Femoral Vein: No evidence of thrombus. Normal compressibility, respiratory phasicity and response to augmentation. Saphenofemoral Junction: No evidence of thrombus. Normal compressibility and  flow on color Doppler imaging. Profunda Femoral Vein: No evidence of thrombus. Normal compressibility and flow on color Doppler imaging. Femoral Vein: No evidence of thrombus. Normal compressibility, respiratory phasicity and response to augmentation. Popliteal Vein: No evidence of thrombus. Normal compressibility, respiratory phasicity and response to augmentation. Calf Veins: No evidence of thrombus. Normal compressibility and flow on color Doppler imaging. Superficial Great Saphenous Vein: No evidence of thrombus. Normal compressibility and flow on color Doppler imaging. Other Findings:  Edema  lower extremity. Borderline enlarged lymph nodes of the inguinal region. IMPRESSION: Sonographic survey bilateral lower extremities negative for DVT. Edema of the lower extremities with likely reactive lymph nodes of the inguinal region. Signed, Dulcy Fanny. Earleen Newport, DO [facility] Electronically Signed   By: Corrie Mckusick D.O.   On: 05/30/2015 12:25   Portable Chest Xray  05/19/2015  CLINICAL DATA:  Acute respiratory failure EXAM: PORTABLE CHEST 1 VIEW COMPARISON:  05/31/2015 FINDINGS: Cardiomegaly again noted. Stable endotracheal and NG tube position. Right IJ central line is unchanged in position. Central mild vascular congestion and mild interstitial edema bilaterally again noted. Stable bilateral pleural effusion with bilateral lower lobe atelectasis or infiltrate. IMPRESSION: Stable support apparatus.Central mild vascular congestion and mild interstitial edema bilaterally again noted. Stable bilateral pleural effusion with bilateral lower lobe atelectasis or infiltrate. Electronically Signed   By: Lahoma Crocker M.D.   On: 05/27/2015 10:15   Dg Chest Port 1 View  05/31/2015  CLINICAL DATA:  Line placement EXAM: PORTABLE CHEST 1 VIEW COMPARISON:  05/31/2015 at 03:33 FINDINGS: Endotracheal tube is 6 cm above the carina. Nasogastric tube extends below the diaphragm.  There is a new right jugular central line with tip in the SVC just below the azygos vein junction. There is no pneumothorax. There are persistent bilateral effusions and there is hazy opacity consistent with bilateral edema or infectious infiltrate. IMPRESSION: Knee right jugular central line. No pneumothorax. Satisfactory ET and NG tube positions. Unchanged effusions and basilar hazy opacities. Electronically Signed   By: Andreas Newport M.D.   On: 05/31/2015 06:01   Portable Chest Xray  05/31/2015  CLINICAL DATA:  Cardiac arrest EXAM: PORTABLE CHEST 1 VIEW COMPARISON:  06/07/2015 FINDINGS: The endotracheal tube is 5 cm above the carina. There are pleural effusions bilaterally. There is perihilar hazy opacity which may represent alveolar edema. No pneumothorax. IMPRESSION: Satisfactory ET tube position. Bilateral effusions.  Probable alveolar edema bilaterally. Electronically Signed   By: Andreas Newport M.D.   On: 05/31/2015 03:55   Dg Abd Portable 1v  05/31/2015  CLINICAL DATA:  Tube placement EXAM: PORTABLE ABDOMEN - 1 VIEW COMPARISON:  None. FINDINGS: The nasogastric tube extends well into the stomach with tip in the region of the mid gastric body. IMPRESSION: Satisfactorily positioned nasogastric tube. Electronically Signed   By: Andreas Newport M.D.   On: 05/31/2015 06:02    Assessment & Plan:: His systemic response the infection appears to be getting better. He is stabilizing at this point but is on a ventilator. Foot is still significant for wounds with heavy drainage from the metatarsal head area and the plantar wound area. Both these areas were cleaned up today and repacked with wet-to-dry saline dressings. Since he is not a surgical candidate at this time we will continue local wound care with 3 times a day wet-to-dry saline dressings and continue intravenous antibiotics. Some point we stabilizes more we may need to incise and drain those regions on his left foot.  Active Problems:    Sepsis ([place])   Acute respiratory failure ([place])   Cardiac arrest [facility])     Family Communication: Plan discussed with patient and his friend   Perry Mount M.D on 06/10/2015 at 10:37 AM  Thank you for the consult, we will follow the patient with you in the Hospital.

## 2015-06-01 NOTE — Progress Notes (Signed)
ANTIBIOTIC CONSULT NOTE - INITIAL  Pharmacy Consult for Vancomycin  Indication: wound infection/cellulitis  No Known Allergies  Patient Measurements: Height: 5' 10" (177.8 cm) Weight: 185 lb 1.6 oz (83.961 kg) IBW/kg (Calculated) : 73 Adjusted Body Weight:   Vital Signs: Temp: 99.3 F (37.4 C) (05/14 1600) Temp Source: Oral (05/14 1600) BP: 112/48 mmHg (05/14 1700) Pulse Rate: 81 (05/14 1700) Intake/Output from previous day: 05/13 0701 - 05/14 0700 In: 6775.4 [I.V.:5585.4; NG/GT:90; IV Piggyback:1100] Out: 1435 [Urine:1330; Emesis/NG output:105] Intake/Output from this shift: Total I/O In: 818.9 [I.V.:614.2; NG/GT:104.8; IV Piggyback:100] Out: 1175 [Urine:1175]  Labs:  Recent Labs  05/30/15 0439 05/31/15 0417 05/28/2015 0420  WBC 21.0* 24.6* 12.0*  HGB 9.5* 9.3* 8.6*  PLT 193 215 166  CREATININE 1.42* 1.53* 1.43*   Estimated Creatinine Clearance: 52.5 mL/min (by C-G formula based on Cr of 1.43).  Recent Labs  06/03/2015 1802  VANCOTROUGH 26*     Microbiology: Recent Results (from the past 720 hour(s))  Culture, blood (Routine x 2)     Status: None (Preliminary result)   Collection Time: 06/03/2015 12:10 PM  Result Value Ref Range Status   Specimen Description BLOOD LEFT ASSIST CONTROL  Final   Special Requests BOTTLES DRAWN AEROBIC AND ANAEROBIC  5CC  Final   Culture NO GROWTH 3 DAYS  Final   Report Status PENDING  Incomplete  Culture, blood (Routine x 2)     Status: None (Preliminary result)   Collection Time: 05/21/2015 12:10 PM  Result Value Ref Range Status   Specimen Description BLOOD RIGHT FATTY CASTS  Final   Special Requests BOTTLES DRAWN AEROBIC AND ANAEROBIC  5CC  Final   Culture NO GROWTH 3 DAYS  Final   Report Status PENDING  Incomplete  Urine culture     Status: None   Collection Time: 05/20/2015  2:29 PM  Result Value Ref Range Status   Specimen Description URINE, RANDOM  Final   Special Requests NONE  Final   Culture NO GROWTH 1 DAY  Final   Report Status 05/31/2015 FINAL  Final  Surgical PCR screen     Status: Abnormal   Collection Time: 06/13/2015  6:30 PM  Result Value Ref Range Status   MRSA, PCR POSITIVE (A) NEGATIVE Final    Comment: CRITICAL RESULT CALLED TO, READ BACK BY AND VERIFIED WITH: [name] FORIANO AT 2019 06/11/2015 MLZ     Staphylococcus aureus POSITIVE (A) NEGATIVE Final    Comment:        The Xpert SA Assay (FDA approved for NASAL specimens in patients over 55 years of age), is one component of a comprehensive surveillance program.  Test performance has been validated by [facility] for patients greater than or equal to 58 year old. It is not intended to diagnose infection nor to guide or monitor treatment.   Wound culture     Status: None (Preliminary result)   Collection Time: 05/30/15  2:00 PM  Result Value Ref Range Status   Specimen Description Foot, Left  Final   Special Requests Immunocompromised  Final   Gram Stain   Final    MODERATE WBC SEEN MODERATE GRAM POSITIVE COCCI MODERATE GRAM POSITIVE RODS FEW GRAM NEGATIVE RODS    Culture   Final    MODERATE GROWTH STREPTOCOCCUS GROUP G MODERATE GROWTH STREPTOCOCCUS GROUP C There is no known Penicillin Resistant Beta Streptococcus in the [place] For patients that are Penicillin-allergic, Erythromycin is 85-94% susceptible, and Clindamycin is 80% susceptible.  Contact Microbiology within  7 days if sensitivity testing is  required.      Report Status PENDING  Incomplete  Anaerobic culture     Status: None (Preliminary result)   Collection Time: 05/30/15  2:00 PM  Result Value Ref Range Status   Specimen Description FOOT LEFT  Final   Special Requests immunocompromised  Final   Culture HOLDING FOR POSSIBLE ANAEROBE  Final   Report Status PENDING  Incomplete  Culture, expectorated sputum-assessment     Status: None   Collection Time: 05/31/15  6:43 AM  Result Value Ref Range Status   Specimen Description TRACHEAL ASPIRATE  Final   Special  Requests NONE  Final   Sputum evaluation THIS SPECIMEN IS ACCEPTABLE FOR SPUTUM CULTURE  Final   Report Status 05/31/2015 FINAL  Final  Culture, respiratory (NON-Expectorated)     Status: None (Preliminary result)   Collection Time: 05/31/15  6:43 AM  Result Value Ref Range Status   Specimen Description TRACHEAL ASPIRATE  Final   Special Requests NONE Reflexed from [id number]  Final   Gram Stain   Final    MODERATE WBC SEEN RARE GRAM POSITIVE COCCI IN PAIRS    Culture Consistent with normal respiratory flora.  Final   Report Status PENDING  Incomplete  Culture, blood (Routine X 2) w Reflex to ID Panel     Status: None (Preliminary result)   Collection Time: 05/31/15  8:13 AM  Result Value Ref Range Status   Specimen Description BLOOD LEFT ANTECUBITAL  Final   Special Requests   Final    BOTTLES DRAWN AEROBIC AND ANAEROBIC  AER 6CC ANA 4CC   Culture NO GROWTH 1 DAY  Final   Report Status PENDING  Incomplete    Medical History: Past Medical History  Diagnosis Date  . Diabetes mellitus with neuropathy ([facility])   . Benign essential HTN   . PVD (peripheral vascular disease) ([facility])     Medications:  Prescriptions prior to admission  Medication Sig Dispense Refill Last Dose  . albuterol (ACCUNEB) 0.63 MG/3ML nebulizer solution Inhale 3 mLs into the lungs every 6 (six) hours as needed for wheezing or shortness of breath.    PRN  . aspirin EC 81 MG tablet Take 81 mg by mouth daily.   01/07/2015 at am  . atorvastatin (LIPITOR) 80 MG tablet Take 80 mg by mouth at bedtime.   01/06/2015 at pm  . clopidogrel (PLAVIX) 75 MG tablet Take 75 mg by mouth daily.   01/07/2015 at am  . collagenase (SANTYL) ointment Apply topically daily. 15 g 0   . insulin glargine (LANTUS) 100 UNIT/ML injection Inject 60 Units into the skin at bedtime.   01/07/2015 at pm  . piperacillin-tazobactam (ZOSYN) 3.375 GM/50ML IVPB Inject 50 mLs (3.375 g total) into the vein every 8 (eight) hours. 500 mL 0   . senna-docusate  (SENOKOT-S) 8.6-50 MG tablet Take 1 tablet by mouth daily.   01/07/2015 at pm   Scheduled:  . antiseptic oral rinse  7 mL Mouth Rinse QID  . atorvastatin  80 mg Per Tube QHS  . chlorhexidine gluconate (SAGE KIT)  15 mL Mouth Rinse BID  . clopidogrel  75 mg Per Tube Daily  . collagenase   Topical Daily  . dextrose      . feeding supplement (VITAL HIGH PROTEIN)  1,000 mL Per Tube Q24H  . fentaNYL (SUBLIMAZE) injection  50 mcg Intravenous Once  . insulin aspart  0-15 Units Subcutaneous Q4H  . ipratropium-albuterol  3 mL   Nebulization Q6H  . mupirocin ointment  1 application Nasal BID  . pantoprazole (PROTONIX) IV  40 mg Intravenous Daily  . piperacillin-tazobactam  3.375 g Intravenous Q8H  . sodium chloride  500 mL Intravenous Once  . sodium chloride flush  3 mL Intravenous Q12H   Assessment: Pharmacy consulted to dose and monitor Vancomycin. Patient is currently on Zosyn 3.375 g IV q8 hours as well. Patient received 1 dose of Vancomycin 1 g IV on 5/11 prior to consult.    Goal of Therapy:  Vancomycin trough level 15-20 mcg/ml  Plan:  Will start Vancomycin 1250 mg IV q18 hours. Will order Vancomycin trough level prior to the 19:00 dose on 5/14.   5/14- VT=26. Will hold the next vanc dose. Will redraw level at 24 hours after last dose 5/15 _0 . If level is within range, then will resume 1253m q 24 hours.    MRamond Dial5/14/2017,6:33 PM

## 2015-06-01 NOTE — Progress Notes (Signed)
ANTICOAGULATION CONSULT NOTE -FOLLOW UP   Pharmacy Consult for Heparin Indication: chest pain/ACS  No Known Allergies  Patient Measurements: Height: 5' 10"  (177.8 cm) Weight: 185 lb 1.6 oz (83.961 kg) IBW/kg (Calculated) : 73 Heparin Dosing Weight: 84.8 kg  Vital Signs: Temp: 98.1 F (36.7 C) (05/14 0000) Temp Source: Axillary (05/14 0000) BP: 112/63 mmHg (05/14 0215) Pulse Rate: 85 (05/14 0215)  Labs:  Recent Labs  06/05/2015 1210  05/30/15 0439  05/30/15 2125 05/31/15 0417 05/31/15 0812 05/31/15 1556 05/19/2015 0420  HGB 9.2*  --  9.5*  --   --  9.3*  --   --  8.6*  HCT 28.7*  --  29.3*  --   --  29.7*  --   --  26.8*  PLT 215  --  193  --   --  215  --   --  166  APTT 37*  --   --   --   --   --   --   --   --   LABPROT 15.6*  --  14.2  --   --  14.0  --   --   --   INR 1.22  --  1.08  --   --  1.06  --   --   --   HEPARINUNFRC  --   < > 0.15*  < > 0.47 0.50  --   --  0.52  CREATININE 1.63*  --  1.42*  --   --  1.53*  --   --  1.43*  TROPONINI 1.85*  --   --   < >  --  0.66* 1.04* 1.56*  --   < > = values in this interval not displayed.  Estimated Creatinine Clearance: 52.5 mL/min (by C-G formula based on Cr of 1.43).   Medical History: Past Medical History  Diagnosis Date  . Diabetes mellitus with neuropathy ([place])   . Benign essential HTN   . PVD (peripheral vascular disease) (HCC)     Medications:  Prescriptions prior to admission  Medication Sig Dispense Refill Last Dose  . albuterol (ACCUNEB) 0.63 MG/3ML nebulizer solution Inhale 3 mLs into the lungs every 6 (six) hours as needed for wheezing or shortness of breath.    PRN  . aspirin EC 81 MG tablet Take 81 mg by mouth daily.   01/07/2015 at am  . atorvastatin (LIPITOR) 80 MG tablet Take 80 mg by mouth at bedtime.   01/06/2015 at pm  . clopidogrel (PLAVIX) 75 MG tablet Take 75 mg by mouth daily.   01/07/2015 at am  . collagenase (SANTYL) ointment Apply topically daily. 15 g 0   . insulin glargine  (LANTUS) 100 UNIT/ML injection Inject 60 Units into the skin at bedtime.   01/07/2015 at pm  . piperacillin-tazobactam (ZOSYN) 3.375 GM/50ML IVPB Inject 50 mLs (3.375 g total) into the vein every 8 (eight) hours. 500 mL 0   . senna-docusate (SENOKOT-S) 8.6-50 MG tablet Take 1 tablet by mouth daily.   01/07/2015 at pm   Scheduled:  . antiseptic oral rinse  7 mL Mouth Rinse QID  . atorvastatin  80 mg Per Tube QHS  . chlorhexidine gluconate (SAGE KIT)  15 mL Mouth Rinse BID  . clopidogrel  75 mg Per Tube Daily  . collagenase   Topical Daily  . fentaNYL (SUBLIMAZE) injection  50 mcg Intravenous Once  . insulin aspart  0-15 Units Subcutaneous Q4H  . ipratropium-albuterol  3 mL Nebulization Q6H  .  mupirocin ointment  1 application Nasal BID  . pantoprazole (PROTONIX) IV  40 mg Intravenous Daily  . piperacillin-tazobactam  3.375 g Intravenous Q8H  . sodium chloride  500 mL Intravenous Once  . sodium chloride flush  3 mL Intravenous Q12H  . vancomycin  1,250 mg Intravenous Q18H   Infusions:  . sodium chloride 75 mL/hr at 05/31/15 1700  . DOPamine 7 mcg/kg/min (06/13/2015 0114)  . epinephrine    . fentaNYL infusion INTRAVENOUS 200 mcg/hr (05/31/15 1829)  . heparin 1,850 Units/hr (05/28/2015 0023)  . norepinephrine Stopped (05/31/15 2552)    Assessment: 65 y/o M admitted with cellulitis and NSTEMI. Patient reports taking Plavix but no blood thinners.   5/12 0500 HL resulted at 0.15 5/12 ~12:00 HL resulted @ 0.17  Goal of Therapy:  Heparin level 0.3-0.7 units/ml Monitor platelets by anticoagulation protocol: Yes   Plan:  Heparin level therapeutic. Continue current rate. Pharmacy will continue to monitor daily.  Laural Benes, Pharm.D., BCPS Clinical Pharmacist 06/12/2015 5:02 AM

## 2015-06-01 NOTE — Progress Notes (Signed)
Will plan on a left lower extremity angiogram with intervention to revascularize the extremity later on in the week when patient is out of unit and stable to undergo the procedure. Will continue to follow. Discussed with Dr. Wyn Quakerew.

## 2015-06-02 ENCOUNTER — Inpatient Hospital Stay: Payer: Medicare Other

## 2015-06-02 DIAGNOSIS — I469 Cardiac arrest, cause unspecified: Secondary | ICD-10-CM

## 2015-06-02 DIAGNOSIS — J810 Acute pulmonary edema: Secondary | ICD-10-CM

## 2015-06-02 DIAGNOSIS — L03116 Cellulitis of left lower limb: Secondary | ICD-10-CM

## 2015-06-02 DIAGNOSIS — I429 Cardiomyopathy, unspecified: Secondary | ICD-10-CM

## 2015-06-02 LAB — GLUCOSE, CAPILLARY
GLUCOSE-CAPILLARY: 128 mg/dL — AB (ref 65–99)
GLUCOSE-CAPILLARY: 134 mg/dL — AB (ref 65–99)
GLUCOSE-CAPILLARY: 176 mg/dL — AB (ref 65–99)
GLUCOSE-CAPILLARY: 182 mg/dL — AB (ref 65–99)
Glucose-Capillary: 129 mg/dL — ABNORMAL HIGH (ref 65–99)
Glucose-Capillary: 182 mg/dL — ABNORMAL HIGH (ref 65–99)
Glucose-Capillary: 190 mg/dL — ABNORMAL HIGH (ref 65–99)

## 2015-06-02 LAB — COMPREHENSIVE METABOLIC PANEL
ALBUMIN: 1.8 g/dL — AB (ref 3.5–5.0)
ALT: 521 U/L — AB (ref 17–63)
AST: 742 U/L — AB (ref 15–41)
Alkaline Phosphatase: 57 U/L (ref 38–126)
Anion gap: 4 — ABNORMAL LOW (ref 5–15)
BUN: 47 mg/dL — ABNORMAL HIGH (ref 6–20)
CHLORIDE: 109 mmol/L (ref 101–111)
CO2: 31 mmol/L (ref 22–32)
Calcium: 7.4 mg/dL — ABNORMAL LOW (ref 8.9–10.3)
Creatinine, Ser: 1.33 mg/dL — ABNORMAL HIGH (ref 0.61–1.24)
GFR calc Af Amer: >60 mL/min (ref >60)
GFR, EST NON AFRICAN AMERICAN: 54 mL/min — AB (ref >60)
Glucose, Bld: 139 mg/dL — ABNORMAL HIGH (ref 65–99)
POTASSIUM: 3.2 mmol/L — AB (ref 3.5–5.1)
Sodium: 144 mmol/L (ref 135–145)
TOTAL PROTEIN: 5.2 g/dL — AB (ref 6.5–8.1)
Total Bilirubin: 1.3 mg/dL — ABNORMAL HIGH (ref 0.3–1.2)

## 2015-06-02 LAB — BLOOD GAS, ARTERIAL
ACID-BASE EXCESS: 11.7 mmol/L — AB (ref 0.0–3.0)
ALLENS TEST (PASS/FAIL): POSITIVE — AB
BICARBONATE: 34.9 meq/L — AB (ref 21.0–28.0)
FIO2: 0.4
LHR: 18 {breaths}/min
Mechanical Rate: 18
O2 SAT: 96.6 %
PEEP/CPAP: 5 cmH2O
PH ART: 7.56 — AB (ref 7.350–7.450)
Patient temperature: 37
VT: 550 mL
pCO2 arterial: 39 mmHg (ref 32.0–48.0)
pO2, Arterial: 74 mmHg — ABNORMAL LOW (ref 83.0–108.0)

## 2015-06-02 LAB — MAGNESIUM: Magnesium: 2.3 mg/dL (ref 1.7–2.4)

## 2015-06-02 LAB — CULTURE, RESPIRATORY: CULTURE: NORMAL

## 2015-06-02 LAB — CBC
HEMATOCRIT: 22.2 % — AB (ref 40.0–52.0)
Hemoglobin: 7.1 g/dL — ABNORMAL LOW (ref 13.0–18.0)
MCH: 27.5 pg (ref 26.0–34.0)
MCHC: 32.1 g/dL (ref 32.0–36.0)
MCV: 85.6 fL (ref 80.0–100.0)
PLATELETS: 144 10*3/uL — AB (ref 150–440)
RBC: 2.59 MIL/uL — AB (ref 4.40–5.90)
RDW: 16 % — AB (ref 11.5–14.5)
WBC: 14.8 10*3/uL — AB (ref 3.8–10.6)

## 2015-06-02 LAB — PHOSPHORUS: PHOSPHORUS: 2.2 mg/dL — AB (ref 2.5–4.6)

## 2015-06-02 LAB — CULTURE, RESPIRATORY W GRAM STAIN

## 2015-06-02 LAB — VANCOMYCIN, TROUGH: Vancomycin Tr: 23 ug/mL (ref 10–20)

## 2015-06-02 LAB — HEPARIN LEVEL (UNFRACTIONATED): HEPARIN UNFRACTIONATED: 0.38 [IU]/mL (ref 0.30–0.70)

## 2015-06-02 MED ORDER — FUROSEMIDE 10 MG/ML IJ SOLN
40.0000 mg | Freq: Four times a day (QID) | INTRAMUSCULAR | Status: AC
  Administered 2015-06-02 (×2): 40 mg via INTRAVENOUS
  Filled 2015-06-02: qty 4

## 2015-06-02 MED ORDER — CEFAZOLIN SODIUM 1-5 GM-% IV SOLN
1.0000 g | Freq: Three times a day (TID) | INTRAVENOUS | Status: DC
  Administered 2015-06-02 – 2015-06-03 (×3): 1 g via INTRAVENOUS
  Filled 2015-06-02 (×5): qty 50

## 2015-06-02 MED ORDER — POTASSIUM CHLORIDE 20 MEQ/15ML (10%) PO SOLN
40.0000 meq | Freq: Three times a day (TID) | ORAL | Status: AC
  Administered 2015-06-02 (×3): 40 meq
  Filled 2015-06-02 (×3): qty 30

## 2015-06-02 MED ORDER — FUROSEMIDE 10 MG/ML IJ SOLN
INTRAMUSCULAR | Status: AC
  Filled 2015-06-02: qty 4

## 2015-06-02 MED ORDER — POTASSIUM CHLORIDE 20 MEQ/15ML (10%) PO SOLN
30.0000 meq | ORAL | Status: AC
  Administered 2015-06-02: 30 meq
  Filled 2015-06-02: qty 30

## 2015-06-02 MED ORDER — VANCOMYCIN HCL 10 G IV SOLR
1250.0000 mg | INTRAVENOUS | Status: DC
  Filled 2015-06-02: qty 1250

## 2015-06-02 MED ORDER — PRO-STAT SUGAR FREE PO LIQD: 30.0000 mL | Freq: Three times a day (TID) | ORAL | Status: DC

## 2015-06-02 MED ORDER — ENOXAPARIN SODIUM 100 MG/ML ~~LOC~~ SOLN
1.0000 mg/kg | Freq: Two times a day (BID) | SUBCUTANEOUS | Status: DC
  Administered 2015-06-02 (×2): 85 mg via SUBCUTANEOUS
  Filled 2015-06-02 (×2): qty 1

## 2015-06-02 MED ORDER — SODIUM PHOSPHATES 45 MMOLE/15ML IV SOLN
10.0000 mmol | Freq: Once | INTRAVENOUS | Status: AC
  Administered 2015-06-02: 10 mmol via INTRAVENOUS
  Filled 2015-06-02 (×2): qty 3.33

## 2015-06-02 MED ORDER — VITAL HIGH PROTEIN PO LIQD
1000.0000 mL | ORAL | Status: DC
  Administered 2015-06-02 (×2): 1000 mL

## 2015-06-02 MED ORDER — FAMOTIDINE 40 MG/5ML PO SUSR
40.0000 mg | Freq: Two times a day (BID) | ORAL | Status: DC
  Administered 2015-06-02 (×2): 40 mg
  Filled 2015-06-02 (×4): qty 5

## 2015-06-02 NOTE — Progress Notes (Signed)
Patient Demographics  Johnny Lane, is a 65 y.o. male   MRN: [id number]   DOB - 26-Mar-1950  Admit Date - 05/28/2015    Outpatient Primary MD for the patient is [name],[name], MD  Consult requested in the Hospital by Laverle Hobby, MD, On 06/02/2015  With History of -  Past Medical History  Diagnosis Date  . Diabetes mellitus with neuropathy ([place])   . Benign essential HTN   . PVD (peripheral vascular disease) [facility])       Past Surgical History  Procedure Laterality Date  . Peripheral vascular catheterization  01/09/2015    Procedure: Lower Extremity Intervention;  Surgeon: Algernon Huxley, MD;  Location: [place] [facility];  Service: Cardiovascular;;  . Peripheral vascular catheterization N/A 01/09/2015    Procedure: Abdominal Aortogram w/Lower Extremity;  Surgeon: Algernon Huxley, MD;  Location: [place] [facility];  Service: Cardiovascular;  Laterality: N/A;    in for   Chief Complaint  Patient presents with  . Altered Mental Status     HPI  Johnny Lane  is a 65 y.o. male, And ICU at this point due to cardiac failure on Friday night and Saturday morning. Currently is ventilated. He is awake and responsive at this point. States doesn't have any pain. Wound is getting dressed on the left foot 2-3 times per day. Severe infections with excess purulence to the left foot.   Social History Social History  Substance Use Topics  . Smoking status: Former Research scientist (life sciences)  . Smokeless tobacco: Not on file  . Alcohol Use: Not on file     Family History History reviewed. No pertinent family history.   Prior to Admission medications   Medication Sig Start Date End Date Taking? Authorizing Provider  albuterol (ACCUNEB) 0.63 MG/3ML nebulizer solution Inhale 3 mLs into the lungs every 6 (six) hours as needed for wheezing or shortness of breath.      Historical Provider, MD  aspirin EC 81 MG tablet Take 81 mg by mouth daily.    Historical Provider, MD  atorvastatin (LIPITOR) 80 MG tablet Take 80 mg by mouth at bedtime.    Historical Provider, MD  clopidogrel (PLAVIX) 75 MG tablet Take 75 mg by mouth daily.    Historical Provider, MD  collagenase (SANTYL) ointment Apply topically daily. 01/14/15   Demetrios Loll, MD  insulin glargine (LANTUS) 100 UNIT/ML injection Inject 60 Units into the skin at bedtime.    Historical Provider, MD  piperacillin-tazobactam (ZOSYN) 3.375 GM/50ML IVPB Inject 50 mLs (3.375 g total) into the vein every 8 (eight) hours. 01/14/15   Demetrios Loll, MD  senna-docusate (SENOKOT-S) 8.6-50 MG tablet Take 1 tablet by mouth daily.    Historical Provider, MD    Anti-infectives    Start     Dose/Rate Route Frequency Ordered Stop   06/02/15 1600  vancomycin (VANCOCIN) 1,250 mg in sodium chloride 0.9 % 250 mL IVPB  Status:  Discontinued     1,250 mg 166.7 mL/hr over 90 Minutes Intravenous Every 48 hours 06/02/15 0210 06/02/15 0845   06/02/15 1400  ceFAZolin (ANCEF) IVPB 1 g/50 mL premix     1 g 100 mL/hr over 30 Minutes Intravenous Every  8 hours 06/02/15 0910     05/30/15 1300  vancomycin (VANCOCIN) 1,250 mg in sodium chloride 0.9 % 250 mL IVPB  Status:  Discontinued     1,250 mg 166.7 mL/hr over 90 Minutes Intravenous Every 18 hours 05/30/15 1247 06/03/2015 1833   06/04/2015 2100  piperacillin-tazobactam (ZOSYN) IVPB 3.375 g  Status:  Discontinued     3.375 g 12.5 mL/hr over 240 Minutes Intravenous Every 8 hours 05/28/2015 1726 06/02/15 0843   05/26/2015 1245  piperacillin-tazobactam (ZOSYN) IVPB 3.375 g     3.375 g 100 mL/hr over 30 Minutes Intravenous  Once 06/18/2015 1235 06/15/2015 1430   06/13/2015 1245  vancomycin (VANCOCIN) IVPB 1000 mg/200 mL premix     1,000 mg 200 mL/hr over 60 Minutes Intravenous  Once 05/24/2015 1235 06/05/2015 1622      Scheduled Meds: . antiseptic oral rinse  7 mL Mouth Rinse QID  . atorvastatin  80 mg  Per Tube QHS  .  ceFAZolin (ANCEF) IV  1 g Intravenous Q8H  . chlorhexidine gluconate (SAGE KIT)  15 mL Mouth Rinse BID  . clopidogrel  75 mg Per Tube Daily  . collagenase   Topical Daily  . enoxaparin (LOVENOX) injection  1 mg/kg Subcutaneous Q12H  . famotidine  40 mg Per Tube BID  . feeding supplement (PRO-STAT SUGAR FREE 64)  30 mL Oral TID  . fentaNYL (SUBLIMAZE) injection  50 mcg Intravenous Once  . insulin aspart  0-15 Units Subcutaneous Q4H  . ipratropium-albuterol  3 mL Nebulization Q6H  . mupirocin ointment  1 application Nasal BID  . potassium chloride  40 mEq Per Tube TID  . sodium chloride  500 mL Intravenous Once  . sodium chloride flush  3 mL Intravenous Q12H   Continuous Infusions: . dextrose 20 mL/hr at 06/18/2015 1541  . feeding supplement (VITAL HIGH PROTEIN) 1,000 mL (06/02/15 1338)  . fentaNYL infusion INTRAVENOUS 250 mcg/hr (06/02/15 1510)   PRN Meds:.acetaminophen **OR** acetaminophen, albuterol, bisacodyl, fentaNYL, midazolam, sennosides  No Known Allergies  Physical Exam: Left foot wound is dressed is no cellulitis or irritation to the legs or the toes and that region. The right foot has erythema and redness along with the ankle in the leg. No open wounds at this point.  Vitals  Blood pressure 125/61, pulse 74, temperature 98.7 F (37.1 C), temperature source Oral, resp. rate 23, height 5' 10"  (1.778 m), weight 86.1 kg (189 lb 13.1 oz), SpO2 100 %.   Data Review  CBC  Recent Labs Lab 06/04/2015 1210 05/30/15 0439 05/31/15 0417 06/05/2015 0420 06/02/15 0402  WBC 19.7* 21.0* 24.6* 12.0* 14.8*  HGB 9.2* 9.5* 9.3* 8.6* 7.1*  HCT 28.7* 29.3* 29.7* 26.8* 22.2*  PLT 215 193 215 166 144*  MCV 85.6 87.2 88.5 86.7 85.6  MCH 27.5 28.2 27.6 27.9 27.5  MCHC 32.1 32.3 31.2* 32.1 32.1  RDW 16.1* 15.8* 16.2* 16.0* 16.0*  LYMPHSABS 0.5*  --   --   --   --   MONOABS 0.8  --   --   --   --   EOSABS 0.0  --   --   --   --   BASOSABS 0.0  --   --   --   --     ------------------------------------------------------------------------------------------------------------------  Chemistries   Recent Labs Lab 05/28/2015 1210 05/30/15 0439 05/31/15 0417 06/16/2015 0420 06/02/15 0402  NA 137 140 140 144 144  K 4.6 4.1 4.8 3.7 3.2*  CL 99* 103 103 106 109  CO2 30 31 27 31 31   GLUCOSE 357* 352* 421* 120* 139*  BUN 47* 54* 56* 56* 47*  CREATININE 1.63* 1.42* 1.53* 1.43* 1.33*  CALCIUM 8.4* 8.1* 8.3* 7.9* 7.4*  MG  --   --  2.4 2.5* 2.3  AST 143* 64*  --   --  742*  ALT 77* 71*  --   --  521*  ALKPHOS 55 49  --   --  57  BILITOT 1.1 0.9  --   --  1.3*   --- ---------------------------------------------------------------------------------------------------------------   Imaging results:   Dg Chest 1 View  06/02/2015  CLINICAL DATA:  Dyspnea, cardiac arrest, acute respiratory failure, sepsis. EXAM: CHEST 1 VIEW COMPARISON:  Portable chest at 3 of Jun 01, 2015 FINDINGS: The lungs are adequately inflated. The lower 1/2 of the hemithoraces demonstrate increased density consistent with posterior layering pleural effusions. The pulmonary vascularity is engorged and the interstitial markings are increased. The cardiac silhouette remains enlarged. The endotracheal tube tip lies 5 cm above the carina. The esophagogastric tube tip projects below the inferior margin of the image. The right internal jugular venous catheter tip projects over the midportion of the SVC. IMPRESSION: Stable appearance of the chest. CHF with mild interstitial edema and moderate-sized bilateral pleural effusions remain. The support tubes are in reasonable position. Electronically Signed   By: Shaheim  Martinique M.D.   On: 06/02/2015 07:16   Portable Chest Xray  05/19/2015  CLINICAL DATA:  Acute respiratory failure EXAM: PORTABLE CHEST 1 VIEW COMPARISON:  05/31/2015 FINDINGS: Cardiomegaly again noted. Stable endotracheal and NG tube position. Right IJ central line is unchanged in position.  Central mild vascular congestion and mild interstitial edema bilaterally again noted. Stable bilateral pleural effusion with bilateral lower lobe atelectasis or infiltrate. IMPRESSION: Stable support apparatus.Central mild vascular congestion and mild interstitial edema bilaterally again noted. Stable bilateral pleural effusion with bilateral lower lobe atelectasis or infiltrate. Electronically Signed   By: Lahoma Crocker M.D.   On: 06/03/2015 10:15    Assessment & Plan: Right now he is fairly stable with the left foot. There is some increased erythema to his right leg which help bring attention to the nurse so that the CCU doctor can address it. Cultures were positive for 2 types of Streptococcus from the deep culture from the plantar midfoot from Friday. White count has reduced his hovering around 14 right now. Plan: Right now is too unstable to do any surgical procedures on. We'll have to wait until later in the week to see if he is more stable. Vascular is also talked about angioplasty on his feet and legs. Erythema to the right leg is a concern I would like the CCU doctor to address this.  Active Problems:   Sepsis ([place])   Acute respiratory failure ([place])   Cardiac arrest [facility])     Family Communication: Plan discussed with patient .    Perry Mount M.D on 06/02/2015 at 5:55 PM

## 2015-06-02 NOTE — Progress Notes (Signed)
Nutrition Follow-up  DOCUMENTATION CODES:   Not applicable  INTERVENTION: -Pt tolerating Vital High Protein, recommend goal rate of 65 ml/hr with Prostat TID (providing 1860 kcals, 182 g of protein, 1310 mL of free water). Potassium and phosphorus being supplemented today. Recommend increasing to rate of 35 ml/hr at present, if tolerating titrate by 10 mL q 4 hours until goal of 65 ml/hr. Continue to assess  NUTRITION DIAGNOSIS:   Inadequate oral intake related to inability to eat as evidenced by NPO status.  Being addressed via TF  GOAL:   Provide needs based on ASPEN/SCCM guidelines  MONITOR:   Vent status, Labs, Weight trends, Skin, I & O's  REASON FOR ASSESSMENT:   Ventilator    ASSESSMENT:   Pt admitted with urosepsis as well as chronic lower extremity diabetic foot ulcers. Podiatry consult placed; per MD note likely surgery tomorrow for I&D and possible amputations.  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  Estimated Nutritional Needs:   Kcal:  1927 kcals (Ve: 10.2, Tmax: 37.4) using wt of 86.1 kg  Protein:  129-172 g (1.5-2.0 g/kg)   Fluid:  >2L fluid  EDUCATION NEEDS:   Education needs no appropriate at this time  Romelle StarcherCate Nemesio Castrillon MS, RD, LDN [phone]  [phone] Pager  [phone] Weekend/On-Call Pager

## 2015-06-02 NOTE — Progress Notes (Signed)
ANTICOAGULATION CONSULT NOTE -FOLLOW UP   Pharmacy Consult for Heparin Indication: chest pain/ACS  No Known Allergies  Patient Measurements: Height: 5' 10"  (177.8 cm) Weight: 185 lb 1.6 oz (83.961 kg) IBW/kg (Calculated) : 73 Heparin Dosing Weight: 84.8 kg  Vital Signs: Temp: 98.2 F (36.8 C) (05/15 0000) Temp Source: Oral (05/15 0000) BP: 109/52 mmHg (05/15 0500) Pulse Rate: 74 (05/15 0500)  Labs:  Recent Labs  05/31/15 0417 05/31/15 0812 05/31/15 1556 05/25/2015 0420 06/02/15 0402  HGB 9.3*  --   --  8.6* 7.1*  HCT 29.7*  --   --  26.8* 22.2*  PLT 215  --   --  166 144*  LABPROT 14.0  --   --   --   --   INR 1.06  --   --   --   --   HEPARINUNFRC 0.50  --   --  0.52 0.38  CREATININE 1.53*  --   --  1.43* 1.33*  TROPONINI 0.66* 1.04* 1.56*  --   --     Estimated Creatinine Clearance: 56.4 mL/min (by C-G formula based on Cr of 1.33).   Medical History: Past Medical History  Diagnosis Date  . Diabetes mellitus with neuropathy ([place])   . Benign essential HTN   . PVD (peripheral vascular disease) (H[facility])     Medications:  Prescriptions prior to admission  Medication Sig Dispense Refill Last Dose  . albuterol (ACCUNEB) 0.63 MG/3ML nebulizer solution Inhale 3 mLs into the lungs every 6 (six) hours as needed for wheezing or shortness of breath.    PRN  . aspirin EC 81 MG tablet Take 81 mg by mouth daily.   01/07/2015 at am  . atorvastatin (LIPITOR) 80 MG tablet Take 80 mg by mouth at bedtime.   01/06/2015 at pm  . clopidogrel (PLAVIX) 75 MG tablet Take 75 mg by mouth daily.   01/07/2015 at am  . collagenase (SANTYL) ointment Apply topically daily. 15 g 0   . insulin glargine (LANTUS) 100 UNIT/ML injection Inject 60 Units into the skin at bedtime.   01/07/2015 at pm  . piperacillin-tazobactam (ZOSYN) 3.375 GM/50ML IVPB Inject 50 mLs (3.375 g total) into the vein every 8 (eight) hours. 500 mL 0   . senna-docusate (SENOKOT-S) 8.6-50 MG tablet Take 1 tablet by mouth  daily.   01/07/2015 at pm   Scheduled:  . antiseptic oral rinse  7 mL Mouth Rinse QID  . atorvastatin  80 mg Per Tube QHS  . chlorhexidine gluconate (SAGE KIT)  15 mL Mouth Rinse BID  . clopidogrel  75 mg Per Tube Daily  . collagenase   Topical Daily  . feeding supplement (VITAL HIGH PROTEIN)  1,000 mL Per Tube Q24H  . fentaNYL (SUBLIMAZE) injection  50 mcg Intravenous Once  . insulin aspart  0-15 Units Subcutaneous Q4H  . ipratropium-albuterol  3 mL Nebulization Q6H  . mupirocin ointment  1 application Nasal BID  . pantoprazole (PROTONIX) IV  40 mg Intravenous Daily  . piperacillin-tazobactam  3.375 g Intravenous Q8H  . sodium chloride  500 mL Intravenous Once  . sodium chloride flush  3 mL Intravenous Q12H  . vancomycin  1,250 mg Intravenous Q48H   Infusions:  . dextrose 20 mL/hr at 06/04/2015 1541  . DOPamine 2 mcg/kg/min (06/09/2015 1551)  . epinephrine    . fentaNYL infusion INTRAVENOUS 250 mcg/hr (06/02/15 0341)  . heparin 1,850 Units/hr (06/02/15 0221)  . norepinephrine Stopped (05/31/15 2751)    Assessment: 65 y/o  M admitted with cellulitis and NSTEMI. Patient reports taking Plavix but no blood thinners.   5/12 0500 HL resulted at 0.15 5/12 ~12:00 HL resulted @ 0.17  Goal of Therapy:  Heparin level 0.3-0.7 units/ml Monitor platelets by anticoagulation protocol: Yes   Plan:  Heparin level therapeutic. Continue current rate. Pharmacy will continue to monitor daily.  Laural Benes, Pharm.D., BCPS Clinical Pharmacist 06/02/2015 5:17 AM

## 2015-06-02 NOTE — Clinical Documentation Improvement (Signed)
Hospitalist  Can the diagnosis of CHF be further specified?    Acuity - Acute, Chronic, Acute on Chronic   Type - Systolic, Diastolic, Systolic and Diastolic  Other  Clinically Undetermined   Document any associated diagnoses/conditions   Supporting Information: Noted has history; ECHO results are available, CXR shows pleural effusions.  Had 2 cardiac arrests since admission.   Please exercise your independent, professional judgment when responding. A specific answer is not anticipated or expected.   Thank Modesta MessingYou,  Mavery Milling L [facility] [place] [phone]

## 2015-06-02 NOTE — Progress Notes (Signed)
ANTIBIOTIC CONSULT NOTE - INITIAL  Pharmacy Consult for Vancomycin  Indication: wound infection/cellulitis  No Known Allergies  Patient Measurements: Height: 5' 10"  (177.8 cm) Weight: 185 lb 1.6 oz (83.961 kg) IBW/kg (Calculated) : 73 Adjusted Body Weight:   Vital Signs: Temp: 98.2 F (36.8 C) (05/15 0000) Temp Source: Oral (05/15 0000) BP: 115/51 mmHg (05/15 0100) Pulse Rate: 76 (05/15 0100) Intake/Output from previous day: 05/14 0701 - 05/15 0700 In: 1005.5 [I.V.:740.8; NG/GT:164.8; IV Piggyback:100] Out: 1175 [Urine:1175] Intake/Output from this shift: Total I/O In: 186.6 [I.V.:126.6; NG/GT:60] Out: -   Labs:  Recent Labs  05/30/15 0439 05/31/15 0417 06/13/2015 0420  WBC 21.0* 24.6* 12.0*  HGB 9.5* 9.3* 8.6*  PLT 193 215 166  CREATININE 1.42* 1.53* 1.43*   Estimated Creatinine Clearance: 52.5 mL/min (by C-G formula based on Cr of 1.43).  Recent Labs  05/28/2015 1802 06/02/15 0028  VANCOTROUGH 26* 23*     Microbiology: Recent Results (from the past 720 hour(s))  Culture, blood (Routine x 2)     Status: None (Preliminary result)   Collection Time: 06/15/2015 12:10 PM  Result Value Ref Range Status   Specimen Description BLOOD LEFT ASSIST CONTROL  Final   Special Requests BOTTLES DRAWN AEROBIC AND ANAEROBIC  5CC  Final   Culture NO GROWTH 3 DAYS  Final   Report Status PENDING  Incomplete  Culture, blood (Routine x 2)     Status: None (Preliminary result)   Collection Time: 06/13/2015 12:10 PM  Result Value Ref Range Status   Specimen Description BLOOD RIGHT FATTY CASTS  Final   Special Requests BOTTLES DRAWN AEROBIC AND ANAEROBIC  5CC  Final   Culture NO GROWTH 3 DAYS  Final   Report Status PENDING  Incomplete  Urine culture     Status: None   Collection Time: 06/17/2015  2:29 PM  Result Value Ref Range Status   Specimen Description URINE, RANDOM  Final   Special Requests NONE  Final   Culture NO GROWTH 1 DAY  Final   Report Status 05/31/2015 FINAL  Final   Surgical PCR screen     Status: Abnormal   Collection Time: 05/31/2015  6:30 PM  Result Value Ref Range Status   MRSA, PCR POSITIVE (A) NEGATIVE Final    Comment: CRITICAL RESULT CALLED TO, READ BACK BY AND VERIFIED WITH: [name] FORIANO AT 2019 05/27/2015 MLZ     Staphylococcus aureus POSITIVE (A) NEGATIVE Final    Comment:        The Xpert SA Assay (FDA approved for NASAL specimens in patients over 35 years of age), is one component of a comprehensive surveillance program.  Test performance has been validated by [facility] for patients greater than or equal to 39 year old. It is not intended to diagnose infection nor to guide or monitor treatment.   Wound culture     Status: None (Preliminary result)   Collection Time: 05/30/15  2:00 PM  Result Value Ref Range Status   Specimen Description Foot, Left  Final   Special Requests Immunocompromised  Final   Gram Stain   Final    MODERATE WBC SEEN MODERATE GRAM POSITIVE COCCI MODERATE GRAM POSITIVE RODS FEW GRAM NEGATIVE RODS    Culture   Final    MODERATE GROWTH STREPTOCOCCUS GROUP G MODERATE GROWTH STREPTOCOCCUS GROUP C There is no known Penicillin Resistant Beta Streptococcus in the [place] For patients that are Penicillin-allergic, Erythromycin is 85-94% susceptible, and Clindamycin is 80% susceptible.  Contact Microbiology within  7 days if sensitivity testing is  required.      Report Status PENDING  Incomplete  Anaerobic culture     Status: None (Preliminary result)   Collection Time: 05/30/15  2:00 PM  Result Value Ref Range Status   Specimen Description FOOT LEFT  Final   Special Requests immunocompromised  Final   Culture HOLDING FOR POSSIBLE ANAEROBE  Final   Report Status PENDING  Incomplete  Culture, expectorated sputum-assessment     Status: None   Collection Time: 05/31/15  6:43 AM  Result Value Ref Range Status   Specimen Description TRACHEAL ASPIRATE  Final   Special Requests NONE  Final   Sputum evaluation  THIS SPECIMEN IS ACCEPTABLE FOR SPUTUM CULTURE  Final   Report Status 05/31/2015 FINAL  Final  Culture, respiratory (NON-Expectorated)     Status: None (Preliminary result)   Collection Time: 05/31/15  6:43 AM  Result Value Ref Range Status   Specimen Description TRACHEAL ASPIRATE  Final   Special Requests NONE Reflexed from [id number]  Final   Gram Stain   Final    MODERATE WBC SEEN RARE GRAM POSITIVE COCCI IN PAIRS    Culture Consistent with normal respiratory flora.  Final   Report Status PENDING  Incomplete  Culture, blood (Routine X 2) w Reflex to ID Panel     Status: None (Preliminary result)   Collection Time: 05/31/15  8:13 AM  Result Value Ref Range Status   Specimen Description BLOOD LEFT ANTECUBITAL  Final   Special Requests   Final    BOTTLES DRAWN AEROBIC AND ANAEROBIC  AER 6CC ANA 4CC   Culture NO GROWTH 1 DAY  Final   Report Status PENDING  Incomplete    Medical History: Past Medical History  Diagnosis Date  . Diabetes mellitus with neuropathy ([place])   . Benign essential HTN   . PVD (peripheral vascular disease) ([facility])     Medications:  Prescriptions prior to admission  Medication Sig Dispense Refill Last Dose  . albuterol (ACCUNEB) 0.63 MG/3ML nebulizer solution Inhale 3 mLs into the lungs every 6 (six) hours as needed for wheezing or shortness of breath.    PRN  . aspirin EC 81 MG tablet Take 81 mg by mouth daily.   01/07/2015 at am  . atorvastatin (LIPITOR) 80 MG tablet Take 80 mg by mouth at bedtime.   01/06/2015 at pm  . clopidogrel (PLAVIX) 75 MG tablet Take 75 mg by mouth daily.   01/07/2015 at am  . collagenase (SANTYL) ointment Apply topically daily. 15 g 0   . insulin glargine (LANTUS) 100 UNIT/ML injection Inject 60 Units into the skin at bedtime.   01/07/2015 at pm  . piperacillin-tazobactam (ZOSYN) 3.375 GM/50ML IVPB Inject 50 mLs (3.375 g total) into the vein every 8 (eight) hours. 500 mL 0   . senna-docusate (SENOKOT-S) 8.6-50 MG tablet Take 1 tablet  by mouth daily.   01/07/2015 at pm   Scheduled:  . antiseptic oral rinse  7 mL Mouth Rinse QID  . atorvastatin  80 mg Per Tube QHS  . chlorhexidine gluconate (SAGE KIT)  15 mL Mouth Rinse BID  . clopidogrel  75 mg Per Tube Daily  . collagenase   Topical Daily  . feeding supplement (VITAL HIGH PROTEIN)  1,000 mL Per Tube Q24H  . fentaNYL (SUBLIMAZE) injection  50 mcg Intravenous Once  . insulin aspart  0-15 Units Subcutaneous Q4H  . ipratropium-albuterol  3 mL Nebulization Q6H  . mupirocin ointment  1 application Nasal BID  . pantoprazole (PROTONIX) IV  40 mg Intravenous Daily  . piperacillin-tazobactam  3.375 g Intravenous Q8H  . sodium chloride  500 mL Intravenous Once  . sodium chloride flush  3 mL Intravenous Q12H   Assessment: Pharmacy consulted to dose and monitor Vancomycin. Patient is currently on Zosyn 3.375 g IV q8 hours as well. Patient received 1 dose of Vancomycin 1 g IV on 5/11 prior to consult.    Goal of Therapy:  Vancomycin trough level 15-20 mcg/ml  Plan:  Vancomycin level decreased from 26 mcg/mL at approximately 1800 on 06/17/2015 to 23 mcg/mL at approximately 0030 on 06/02/2015. Recalculated Ke 0.019 hr-1. Concentration predicted to be less than 20 but greater than 15 mcg/mL approximately 8 to 22 hours after last level, 0030 on 06/02/2015. Will resume dosing of 1.25 gm IV Q48H at 1600 06/02/2015, when predicted concentration 17 mcg/mL. Trough level due 05/30/2015 at 1530. Pharmacy will continue to follow BMP and creatinine clearance daily.  Laural Benes, Pharm.D., BCPS Clinical Pharmacist 06/02/2015,2:04 AM

## 2015-06-02 NOTE — Progress Notes (Signed)
ANTICOAGULATION CONSULT NOTE - Initial Consult  Pharmacy Consult for Lovenox Indication: chest pain/ACS  No Known Allergies  Patient Measurements: Height: 5' 10"  (177.8 cm) Weight: 189 lb 13.1 oz (86.1 kg) IBW/kg (Calculated) : 73   Vital Signs: Temp: 98.8 F (37.1 C) (05/15 0700) Temp Source: Oral (05/15 0500) BP: 121/57 mmHg (05/15 0900) Pulse Rate: 79 (05/15 0900)  Labs:  Recent Labs  05/31/15 0417 05/31/15 0812 05/31/15 1556 05/27/2015 0420 06/02/15 0402  HGB 9.3*  --   --  8.6* 7.1*  HCT 29.7*  --   --  26.8* 22.2*  PLT 215  --   --  166 144*  LABPROT 14.0  --   --   --   --   INR 1.06  --   --   --   --   HEPARINUNFRC 0.50  --   --  0.52 0.38  CREATININE 1.53*  --   --  1.43* 1.33*  TROPONINI 0.66* 1.04* 1.56*  --   --     Estimated Creatinine Clearance: 56.4 mL/min (by C-G formula based on Cr of 1.33).   Medical History: Past Medical History  Diagnosis Date  . Diabetes mellitus with neuropathy ([place])   . Benign essential HTN   . PVD (peripheral vascular disease) (HCC)     Medications:  Scheduled:  . antiseptic oral rinse  7 mL Mouth Rinse QID  . atorvastatin  80 mg Per Tube QHS  .  ceFAZolin (ANCEF) IV  1 g Intravenous Q8H  . chlorhexidine gluconate (SAGE KIT)  15 mL Mouth Rinse BID  . clopidogrel  75 mg Per Tube Daily  . collagenase   Topical Daily  . enoxaparin (LOVENOX) injection  1 mg/kg Subcutaneous Q12H  . famotidine  40 mg Per Tube BID  . feeding supplement (VITAL HIGH PROTEIN)  1,000 mL Per Tube Q24H  . fentaNYL (SUBLIMAZE) injection  50 mcg Intravenous Once  . insulin aspart  0-15 Units Subcutaneous Q4H  . ipratropium-albuterol  3 mL Nebulization Q6H  . mupirocin ointment  1 application Nasal BID  . potassium chloride  30 mEq Per Tube Q4H  . potassium chloride  40 mEq Per Tube TID  . sodium chloride  500 mL Intravenous Once  . sodium chloride flush  3 mL Intravenous Q12H  . sodium phosphate  Dextrose 5% IVPB  10 mmol Intravenous Once    Infusions:  . dextrose 20 mL/hr at 05/27/2015 1541  . fentaNYL infusion INTRAVENOUS 250 mcg/hr (06/02/15 0341)    Assessment: 65 y/o M admitted with cellulitis, sepsis, and NSTEMI on heparin drip with orders to convert to Lovenox.   Goal of Therapy:  Monitor platelets by anticoagulation protocol: Yes   Plan:  Lovenox 85 mg (43m/kg) [place] q 12 hours to begin 1 hour after stopping heparin infusion.   Pharmacy will continue to monitor and adjust per consult.   CUlice DashD 06/02/2015,11:00 AM

## 2015-06-02 NOTE — Clinical Documentation Improvement (Signed)
Cardiology Hospitalist  Can the diagnosis of CKD be further specified?   CKD Stage I - GFR greater than or equal to 90  CKD Stage II - GFR 60-89  CKD Stage III - GFR 30-59  CKD Stage IV - GFR 15-29  CKD Stage V - GFR < 15  ESRD (End Stage Renal Disease)  Other condition  Unable to clinically determine   Supporting Information: : (risk factors, signs and symptoms, diagnostics, treatment) On admission BUN was 47 and has gone as high as 56; Creatinine was 1.63 and is currently 1.43; GFR was 42 on admission ans now is >60.   Please exercise your independent, professional judgment when responding. A specific answer is not anticipated or expected.   Thank Modesta MessingYou, Griffen Frayne L [facility] Health Information Management [place] [phone]

## 2015-06-02 NOTE — Progress Notes (Signed)
Chaplain rounded the unit and provided a compassionate presence and support to the patient through silent prayer. Jefm PettyChaplain Jameica Couts [phone]

## 2015-06-02 NOTE — Progress Notes (Signed)
1800 Good day.Unable to tolerate vent. Weaning. Diuresised of urine today. Calm, cooperative,  and pleasant.
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
## 2015-06-02 NOTE — Clinical Documentation Improvement (Signed)
Hospitalist  Would you please clarify medical condition related to clinical findings?   Document Severity - Severe(third degree), Moderate (second degree), Mild (first degree)  Other condition  Unable to clinically determine  Document any associated diagnoses/conditions   Supporting Information: :  BMI - 23.56.  Consult by RD and supplements.  Multiple admissions.  Intubated.  Cardiac arrest X 2 this admission.  Multiple pressure ulcers   Please exercise your independent, professional judgment when responding. A specific answer is not anticipated or expected.   Thank Modesta MessingYou, Joury Allcorn L [facility] Health Information Management [place] [phone]

## 2015-06-02 NOTE — Progress Notes (Signed)
[facility] FOR [facility] LAB REPLACEMENT ONLY  The patient does [facility] based on the criteria listed below:   1. Is GFR >/= 40 ml/min? Yes.    Patient's GFR today is >60 2. Is urine output >/= 0.5 ml/kg/hr for the last 6 hours? Yes.   Patient's UOP is 1.09 ml/kg/hr 3. Is BUN < 60 mg/dL? Yes.    Patient's BUN today is 47 4. Abnormal electrolyte  K 3.2 5. Ordered repletion with: per protocol 6. If a panic level lab has been reported, has the CCM MD in charge been notified? Yes.  .   Physician:  Sharol RousselRamaswamy  Everard Interrante McEachran 06/02/2015 6:40 AM

## 2015-06-02 NOTE — Progress Notes (Signed)
Pharmacy Antibiotic Note  Johnny Lane is a 65 y.o. male admitted on 06/01/2015 with sepsis and cellulitis and NSTEMI.  Pharmacy has been consulted for cefazolin dosing. Patient previously on vancomycin and Zosyn. All cultures negative except wound culture growing Group C and G Strep. Antibiotics changed to cefazolin.   Plan: Cefazolin 1 g iv q 8 hours.   Height: 5\' 10"  (177.8 cm) Weight: 189 lb 13.1 oz (86.1 kg) IBW/kg (Calculated) : 73  Temp (24hrs), Avg:98.9 F (37.2 C), Min:98.2 F (36.8 C), Max:99.3 F (37.4 C)   Recent Labs Lab 05/30/2015 1210 06/06/2015 1626 05/30/15 0439 05/31/15 0417 05/21/2015 0420 06/15/2015 1802 06/02/15 0028 06/02/15 0402  WBC 19.7*  --  21.0* 24.6* 12.0*  --   --  14.8*  CREATININE 1.63*  --  1.42* 1.53* 1.43*  --   --  1.33*  LATICACIDVEN 2.2* 1.9  --  3.5*  --   --   --   --   VANCOTROUGH  --   --   --   --   --  26* 23*  --     Estimated Creatinine Clearance: 56.4 mL/min (by C-G formula based on Cr of 1.33).    No Known Allergies  Antimicrobials this admission: Vancomycin and Zosyn 5/11 >> 5/15 cefazolin 5/15 >>   Dose adjustments this admission:   Microbiology results: 5/12 wound cx: Group G and C Strep 5/13 TA: nl flora 5/13 UCx: NGTD 5/13 BCx: NGTD 5/11 BCx: NGTD x 2 5/11 UCx: negative  5/11 MRSA PCR: positive  Thank you for allowing pharmacy to be a part of this patient's care.  Johnny Lane, Johnny Lane D 06/02/2015 11:05 AM

## 2015-06-03 ENCOUNTER — Inpatient Hospital Stay: Payer: Medicare Other

## 2015-06-03 LAB — ANAEROBIC CULTURE

## 2015-06-03 LAB — CBC
HEMATOCRIT: 22.1 % — AB (ref 40.0–52.0)
HEMOGLOBIN: 7.1 g/dL — AB (ref 13.0–18.0)
MCH: 27.4 pg (ref 26.0–34.0)
MCHC: 32.1 g/dL (ref 32.0–36.0)
MCV: 85.2 fL (ref 80.0–100.0)
Platelets: 157 10*3/uL (ref 150–440)
RBC: 2.6 MIL/uL — ABNORMAL LOW (ref 4.40–5.90)
RDW: 16.4 % — AB (ref 11.5–14.5)
WBC: 12.2 10*3/uL — ABNORMAL HIGH (ref 3.8–10.6)

## 2015-06-03 LAB — BASIC METABOLIC PANEL
Anion gap: 3 — ABNORMAL LOW (ref 5–15)
BUN: 39 mg/dL — AB (ref 6–20)
CHLORIDE: 106 mmol/L (ref 101–111)
CO2: 34 mmol/L — AB (ref 22–32)
Calcium: 7.5 mg/dL — ABNORMAL LOW (ref 8.9–10.3)
Creatinine, Ser: 1.17 mg/dL (ref 0.61–1.24)
GFR calc Af Amer: >60 mL/min (ref >60)
GFR calc non Af Amer: >60 mL/min (ref >60)
GLUCOSE: 195 mg/dL — AB (ref 65–99)
POTASSIUM: 4 mmol/L (ref 3.5–5.1)
Sodium: 143 mmol/L (ref 135–145)

## 2015-06-03 LAB — URINE CULTURE: Culture: NO GROWTH

## 2015-06-03 LAB — WOUND CULTURE

## 2015-06-03 LAB — GLUCOSE, CAPILLARY
GLUCOSE-CAPILLARY: 181 mg/dL — AB (ref 65–99)
Glucose-Capillary: 118 mg/dL — ABNORMAL HIGH (ref 65–99)
Glucose-Capillary: 141 mg/dL — ABNORMAL HIGH (ref 65–99)
Glucose-Capillary: 175 mg/dL — ABNORMAL HIGH (ref 65–99)
Glucose-Capillary: 185 mg/dL — ABNORMAL HIGH (ref 65–99)
Glucose-Capillary: 199 mg/dL — ABNORMAL HIGH (ref 65–99)

## 2015-06-03 LAB — CULTURE, BLOOD (ROUTINE X 2)
Culture: NO GROWTH
Culture: NO GROWTH

## 2015-06-03 LAB — PROCALCITONIN: Procalcitonin: 1.9 ng/mL

## 2015-06-03 MED ORDER — ACETAZOLAMIDE SODIUM 500 MG IJ SOLR
500.0000 mg | Freq: Once | INTRAMUSCULAR | Status: AC
  Administered 2015-06-03: 500 mg via INTRAVENOUS
  Filled 2015-06-03: qty 500

## 2015-06-03 MED ORDER — FENTANYL CITRATE (PF) 100 MCG/2ML IJ SOLN: 25.0000 ug | INTRAMUSCULAR | Status: DC | PRN

## 2015-06-03 MED ORDER — CLOPIDOGREL BISULFATE 75 MG PO TABS
75.0000 mg | ORAL_TABLET | Freq: Every day | ORAL | Status: DC
  Administered 2015-06-04 – 2015-06-05 (×2): 75 mg via ORAL
  Filled 2015-06-03 (×2): qty 1

## 2015-06-03 MED ORDER — AMIODARONE HCL IN DEXTROSE 360-4.14 MG/200ML-% IV SOLN
30.0000 mg/h | INTRAVENOUS | Status: DC
  Administered 2015-06-04: 30 mg/h via INTRAVENOUS
  Filled 2015-06-03 (×3): qty 200

## 2015-06-03 MED ORDER — AMIODARONE HCL IN DEXTROSE 360-4.14 MG/200ML-% IV SOLN
60.0000 mg/h | INTRAVENOUS | Status: DC
  Administered 2015-06-03 (×2): 60 mg/h via INTRAVENOUS
  Filled 2015-06-03: qty 200

## 2015-06-03 MED ORDER — FAMOTIDINE 40 MG/5ML PO SUSR
20.0000 mg | Freq: Two times a day (BID) | ORAL | Status: DC
  Administered 2015-06-03: 20 mg
  Filled 2015-06-03 (×2): qty 2.5

## 2015-06-03 MED ORDER — ATORVASTATIN CALCIUM 20 MG PO TABS: 80.0000 mg | ORAL_TABLET | Freq: Every day | ORAL | Status: DC

## 2015-06-03 MED ORDER — AMIODARONE LOAD VIA INFUSION
150.0000 mg | Freq: Once | INTRAVENOUS | Status: AC
  Administered 2015-06-03: 150 mg via INTRAVENOUS
  Filled 2015-06-03: qty 83.34

## 2015-06-03 MED ORDER — STERILE WATER FOR INJECTION IJ SOLN
INTRAMUSCULAR | Status: AC
  Administered 2015-06-03: 10:00:00
  Filled 2015-06-03: qty 10

## 2015-06-03 MED ORDER — DEXTROSE 5 % IV SOLN
2.0000 g | INTRAVENOUS | Status: DC
  Administered 2015-06-03 – 2015-06-08 (×6): 2 g via INTRAVENOUS
  Filled 2015-06-03 (×6): qty 2

## 2015-06-03 MED ORDER — ENOXAPARIN SODIUM 40 MG/0.4ML ~~LOC~~ SOLN
40.0000 mg | SUBCUTANEOUS | Status: DC
  Administered 2015-06-03 – 2015-06-07 (×5): 40 mg via SUBCUTANEOUS
  Filled 2015-06-03 (×5): qty 0.4

## 2015-06-03 NOTE — Progress Notes (Signed)
Extubated without complications to 3lnc 

## 2015-06-03 NOTE — Progress Notes (Signed)
Pt went into afib rvr.  Pt started on amiodarone drip.  Pt remains alert and oriented.  Now on 4L nasal cannula.

## 2015-06-03 NOTE — Progress Notes (Signed)
eLink Physician-Brief Progress Note Patient Name: Johnny Lane DOB: 09/09/1950 MRN: [id number]   Date of Service  06/03/2015  HPI/Events of Note  A-fib with RVR, hemodynamics ok.  eICU Interventions  Amio load and drip.     Intervention Category Major Interventions: Other:  YACOUB,WESAM 06/03/2015, 9:07 PM

## 2015-06-03 NOTE — Progress Notes (Signed)
Chaplain rounded the unit and provided a compassionate presence and support for the patient.  The patient welcomed the visit. Jefm PettyChaplain Lexys Milliner [phone]

## 2015-06-03 NOTE — Progress Notes (Signed)
PULMONARY / CRITICAL CARE MEDICINE   Name: Johnny RastDavid Buckles MRN: [id number] DOB: 04/13/1950    ADMISSION DATE:  September 26, 2015   CONSULTATION DATE:  05/31/2015  REFERRING MD:  Hospitalists  PT PROFILE:  [age] M with severe cardiomyopathy, DM2, PVD, chronic venous stasis admitted 5/11 with dx of cellulitis, sepsis. Initial cardiac markers were mildly elevated. On morning of 5/13 found pulseless/PEA arrest. Intubated and transferred to ICU. PCCM assumed care. Extubated 05/16 and cognition intact.  MAJOR EVENTS/TEST RESULTS: 05/11 admitted as above. Cardiac markers elevated (trop I 1.85) 05/12 TTE: LVEF 25-30% 05/12 MRI L foot: Large skin ulcerations and areas of debridement on the left foot. Negative for osteomyelitis or septic joint 05/12 LE venous US: Sonographic survey bilateral lower extremities negative for DVT 05/13 PEA arrest - prolonged resuscitation. Intubated   INDWELLING DEVICES:: ETT 05/13 >> 05/16 R IJ CVl 05/13 >>   MICRO DATA: L foot wound 05/12 >> moderate growth Groups G and C strep, light growth enterobacter   ANTIMICROBIALS:  Anti-infectives    Start     Dose/Rate Route Frequency Ordered Stop   06/03/15 1100  cefTRIAXone (ROCEPHIN) 2 g in dextrose 5 % 50 mL IVPB     2 g 100 mL/hr over 30 Minutes Intravenous Every 24 hours 06/03/15 1048     06/02/15 1600  vancomycin (VANCOCIN) 1,250 mg in sodium chloride 0.9 % 250 mL IVPB  Status:  Discontinued     1,250 mg 166.7 mL/hr over 90 Minutes Intravenous Every 48 hours 06/02/15 0210 06/02/15 0845   06/02/15 1400  ceFAZolin (ANCEF) IVPB 1 g/50 mL premix  Status:  Discontinued     1 g 100 mL/hr over 30 Minutes Intravenous Every 8 hours 06/02/15 0910 06/03/15 1048   05/30/15 1300  vancomycin (VANCOCIN) 1,250 mg in sodium chloride 0.9 % 250 mL IVPB  Status:  Discontinued     1,250 mg 166.7 mL/hr over 90 Minutes Intravenous Every 18 hours 05/30/15 1247 06/02/2015 1833   06-27-15 2100  piperacillin-tazobactam (ZOSYN) IVPB 3.375 g   Status:  Discontinued     3.375 g 12.5 mL/hr over 240 Minutes Intravenous Every 8 hours 06-27-15 1726 06/02/15 0843   06-27-15 1245  piperacillin-tazobactam (ZOSYN) IVPB 3.375 g     3.375 g 100 mL/hr over 30 Minutes Intravenous  Once 06-27-15 1235 06-27-15 1430   06-27-15 1245  vancomycin (VANCOCIN) IVPB 1000 mg/200 mL premix     1,000 mg 200 mL/hr over 60 Minutes Intravenous  Once 06-27-15 1235 06-27-15 1622        SUBJECTIVE:  RASS 0, + F/C. Passed SBT and extubated. Appears to be tolerating  VITAL SIGNS: BP 134/66 mmHg  Pulse 96  Temp(Src) 97 F (36.1 C) (Oral)  Resp 21  Ht 5\' 10"  (1.778 m)  Wt 84.6 kg (186 lb 8.2 oz)  BMI 26.76 kg/m2  SpO2 100%  HEMODYNAMICS:    VENTILATOR SETTINGS: Vent Mode:  [-] PRVC FiO2 (%):  [35 %-40 %] 35 % Set Rate:  [14 bmp] 14 bmp Vt Set:  [550 mL] 550 mL PEEP:  [5 cmH20] 5 cmH20  INTAKE / OUTPUT: I/O last 3 completed shifts: In: 2706.9 [I.V.:1588; NG/GT:1018.9; IV Piggyback:100] Out: 3500 [Urine:3500]  PHYSICAL EXAMINATION: NAD HEENT WNL No wheezes Reg, no M NABS, soft Severe chronic stasis changes L foot in surgical dressing No focal neuro deficitd  LABS:  BMET  Recent Labs Lab 06/15/2015 0420 06/02/15 0402 06/03/15 0401  NA 144 144 143  K 3.7 3.2* 4.0  CL  106 109 106  CO2 31 31 34*  BUN 56* 47* 39*  CREATININE 1.43* 1.33* 1.17  GLUCOSE 120* 139* 195*    Electrolytes  Recent Labs Lab 05/31/15 0417 06/15/2015 0420 06/02/15 0402 06/03/15 0401  CALCIUM 8.3* 7.9* 7.4* 7.5*  MG 2.4 2.5* 2.3  --   PHOS 5.3* 2.5 2.2*  --     CBC  Recent Labs Lab 05/26/2015 0420 06/02/15 0402 06/03/15 0401  WBC 12.0* 14.8* 12.2*  HGB 8.6* 7.1* 7.1*  HCT 26.8* 22.2* 22.1*  PLT 166 144* 157    Coag's  Recent Labs Lab May 31, 2015 1210 05/30/15 0439 05/31/15 0417  APTT 37*  --   --   INR 1.22 1.08 1.06    Sepsis Markers  Recent Labs Lab 2015/05/31 1210 31-May-2015 1626 05/31/15 0417 06/03/15 0401  LATICACIDVEN  2.2* 1.9 3.5*  --   PROCALCITON  --   --  8.20 1.90    ABG  Recent Labs Lab 05/31/15 2300 06/11/2015 0436 06/02/15 0500  PHART 7.49* 7.49* 7.56*  PCO2ART 46 45 39  PO2ART 144* 114* 74*    Liver Enzymes  Recent Labs Lab 05-31-2015 1210 05/30/15 0439 06/02/15 0402  AST 143* 64* 742*  ALT 77* 71* 521*  ALKPHOS 55 49 57  BILITOT 1.1 0.9 1.3*  ALBUMIN 2.7* 2.3* 1.8*    Cardiac Enzymes  Recent Labs Lab 05/31/15 0417 05/31/15 0812 05/31/15 1556  TROPONINI 0.66* 1.04* 1.56*    Glucose  Recent Labs Lab 06/02/15 1215 06/02/15 1550 06/02/15 1959 06/02/15 2346 06/03/15 0347 06/03/15 0743  GLUCAP 176* 190* 182* 182* 175* 141*    CXR: NSC edema pattern with B effusions   ASSESSMENT / PLAN:  PULMONARY A: Acute hypoxic respiratory failure Pulmonary edema P:   Monitor in ICU post extubation Supplemental O2 to maintain SpO2 > 92%  CARDIOVASCULAR A:  Elevated cardiac markers - peak troponin 1.85 S/P PEA arrest 05/13 Severe ischemic cardiomyopathy History of hypertension P:  Hemodynamics per ICU protocol  RENAL A:   AKI - resolving Hypervolemia Loop diuretic induced metabolic acidosis P:   Monitor BMET intermittently Monitor I/Os Correct electrolytes as indicated Acetazolamide X 1 05/16  GASTROINTESTINAL A:   No acute issues P:   NPO post extubation SUP not indicated post extubation  HEMATOLOGIC A:   Anemia without overt bleeding P:  DVT px: LMWH Monitor CBC intermittently Transfuse per usual guidelines  INFECTIOUS A:   LLE wound infection P:   Monitor temp, WBC count Micro and abx as above  ENDOCRINE A:   DM 2  P:   Cont SSI  NEUROLOGIC A:   Acute encephalopathy, resolved  Chronic pain P:   RASS goal: 0 PRN fentanyl   Disposition and family update:  No family at bedside this am  CCM time: 40 mins The above time includes time spent in consultation with patient and/or family members and reviewing care plan on  multidisciplinary rounds  [name], MD PCCM service Mobile [phone] Pager [phone]    06/03/2015, 12:14 PM

## 2015-06-03 NOTE — Progress Notes (Signed)
[facility]  Daily Progress Note   Subjective  - 2 Days Post-Op  Patient extubated this morning. Seems to be tolerating this reasonably well at this point. No fever or chills. No other cardiopulmonary events.  Objective Filed Vitals:   06/03/15 1100 06/03/15 1200 06/03/15 1300 06/03/15 1400  BP: 134/66 129/61 117/61 126/65  Pulse: 96 96 92 94  Temp:  97.7 F (36.5 C)    TempSrc:  Oral    Resp: 21 18 21 23   Height:      Weight:      SpO2: 100% 90% 96% 96%    Intake/Output Summary (Last 24 hours) at 06/03/15 1511 Last data filed at 06/03/15 1400  Gross per 24 hour  Intake 2003.17 ml  Output   1750 ml  Net 253.17 ml    PULM  Somewhat diminished bilaterally but equal. Respirations not labored and now off the ventilator on nasal cannula oxygen CV  RRR VASC  Severe ulcerations bilateral lower extremity with nonpalpable pedal pulses bilaterally.  Laboratory CBC    Component Value Date/Time   WBC 12.2* 06/03/2015 0401   WBC 8.8 12/25/2013 1136   HGB 7.1* 06/03/2015 0401   HGB 14.2 12/25/2013 1136   HCT 22.1* 06/03/2015 0401   HCT 43.7 12/25/2013 1136   PLT 157 06/03/2015 0401   PLT 210 12/25/2013 1136    BMET    Component Value Date/Time   NA 143 06/03/2015 0401   NA 140 12/25/2013 1136   K 4.0 06/03/2015 0401   K 3.9 12/25/2013 1136   CL 106 06/03/2015 0401   CL 107 12/25/2013 1136   CO2 34* 06/03/2015 0401   CO2 30 12/25/2013 1136   GLUCOSE 195* 06/03/2015 0401   GLUCOSE 269* 12/25/2013 1136   BUN 39* 06/03/2015 0401   BUN 21* 12/25/2013 1136   CREATININE 1.17 06/03/2015 0401   CREATININE 0.71 12/25/2013 1136   CALCIUM 7.5* 06/03/2015 0401   CALCIUM 8.4* 12/25/2013 1136   GFRNONAA >60 06/03/2015 0401   GFRNONAA >60 12/25/2013 1136   GFRAA >60 06/03/2015 0401   GFRAA >60 12/25/2013 1136    Assessment/Planning: Multiple ongoing issues but bilateral PAD with ulcerations   Needs an angio and this was scheduled for yesterday,  but put on hold due to PEA arrest and continued ventilation.  Now extubated and seems more stable.  We'll reassess tomorrow and if the critical care service feels that he is stable enough to go down for procedure, may be able to do his angiogram tomorrow or Thursday.  Will need both legs done and can only do one leg at a time so would reassess to do the other leg sometime next week.    Pollyanna Levay  06/03/2015, 3:11 PM

## 2015-06-03 NOTE — Progress Notes (Signed)
Daily Progress Note   Subjective  - f/u left foot ulcerations.  Taking over podiatry service.  Pt recently extubated.  Pt with infection to multiple ulcerations left foot.      Objective Filed Vitals:   06/03/15 1400 06/03/15 1500 06/03/15 1600 06/03/15 1700  BP: 126/65 125/69 121/63 135/70  Pulse: 94 100 101 104  Temp:      TempSrc:      Resp: 23 24 22 26   Height:      Weight:      SpO2: 96% 99%  94%    Physical Exam: Left foot ulcerations with some continued serrous drainage and scant purulence to 1st mtpj with areas of necrotic skin.  Plantar midfoot ulceration packed and upon removal no sever purulence today.  Posterior heel ulceration with mixed fibrotic and necrotic tissue.  Laboratory CBC    Component Value Date/Time   WBC 12.2* 06/03/2015 0401   WBC 8.8 12/25/2013 1136   HGB 7.1* 06/03/2015 0401   HGB 14.2 12/25/2013 1136   HCT 22.1* 06/03/2015 0401   HCT 43.7 12/25/2013 1136   PLT 157 06/03/2015 0401   PLT 210 12/25/2013 1136    BMET    Component Value Date/Time   NA 143 06/03/2015 0401   NA 140 12/25/2013 1136   K 4.0 06/03/2015 0401   K 3.9 12/25/2013 1136   CL 106 06/03/2015 0401   CL 107 12/25/2013 1136   CO2 34* 06/03/2015 0401   CO2 30 12/25/2013 1136   GLUCOSE 195* 06/03/2015 0401   GLUCOSE 269* 12/25/2013 1136   BUN 39* 06/03/2015 0401   BUN 21* 12/25/2013 1136   CREATININE 1.17 06/03/2015 0401   CREATININE 0.71 12/25/2013 1136   CALCIUM 7.5* 06/03/2015 0401   CALCIUM 8.4* 12/25/2013 1136   GFRNONAA >60 06/03/2015 0401   GFRNONAA >60 12/25/2013 1136   GFRAA >60 06/03/2015 0401   GFRAA >60 12/25/2013 1136    Assessment/Planning: Severe pvd with multiple ulceration left foot. Cellulitis right foot.    To angio tomorrow hopefully if stable.  Will need debridement of residual necrotic and infected tissue to left foot.  Will plan for Friday.  Will re-evaluate on Thursday.    Gwyneth RevelsFowler, Elwyn Lowden A  06/03/2015, 5:16 PM

## 2015-06-03 NOTE — Progress Notes (Signed)
Patient extubated and on 3L nasal cannula with o2 sats 100%.  Denies pain.  Alert to self and place.  Hoarse soft voice. No respiratory distress.  Coughs on instruction.  Continuing to monitor.

## 2015-06-04 ENCOUNTER — Inpatient Hospital Stay: Payer: Medicare Other

## 2015-06-04 DIAGNOSIS — I480 Paroxysmal atrial fibrillation: Secondary | ICD-10-CM

## 2015-06-04 LAB — BASIC METABOLIC PANEL
ANION GAP: 6 (ref 5–15)
BUN: 41 mg/dL — AB (ref 6–20)
CHLORIDE: 104 mmol/L (ref 101–111)
CO2: 33 mmol/L — AB (ref 22–32)
Calcium: 7.5 mg/dL — ABNORMAL LOW (ref 8.9–10.3)
Creatinine, Ser: 1.19 mg/dL (ref 0.61–1.24)
GFR calc Af Amer: >60 mL/min (ref >60)
GLUCOSE: 196 mg/dL — AB (ref 65–99)
POTASSIUM: 3.9 mmol/L (ref 3.5–5.1)
Sodium: 143 mmol/L (ref 135–145)

## 2015-06-04 LAB — CBC
HEMATOCRIT: 22.3 % — AB (ref 40.0–52.0)
HEMOGLOBIN: 7 g/dL — AB (ref 13.0–18.0)
MCH: 27.9 pg (ref 26.0–34.0)
MCHC: 31.5 g/dL — AB (ref 32.0–36.0)
MCV: 88.6 fL (ref 80.0–100.0)
Platelets: 190 10*3/uL (ref 150–440)
RBC: 2.51 MIL/uL — ABNORMAL LOW (ref 4.40–5.90)
RDW: 16.2 % — AB (ref 11.5–14.5)
WBC: 11.8 10*3/uL — ABNORMAL HIGH (ref 3.8–10.6)

## 2015-06-04 LAB — GLUCOSE, CAPILLARY
GLUCOSE-CAPILLARY: 191 mg/dL — AB (ref 65–99)
GLUCOSE-CAPILLARY: 203 mg/dL — AB (ref 65–99)
GLUCOSE-CAPILLARY: 194 mg/dL — AB (ref 65–99)
GLUCOSE-CAPILLARY: 202 mg/dL — AB (ref 65–99)
Glucose-Capillary: 159 mg/dL — ABNORMAL HIGH (ref 65–99)
Glucose-Capillary: 213 mg/dL — ABNORMAL HIGH (ref 65–99)

## 2015-06-04 LAB — PREPARE RBC (CROSSMATCH)

## 2015-06-04 LAB — PROCALCITONIN: Procalcitonin: 1.12 ng/mL

## 2015-06-04 LAB — ABO/RH: ABO/RH(D): O POS

## 2015-06-04 MED ORDER — LIDOCAINE-EPINEPHRINE (PF) 1 %-1:200000 IJ SOLN
INTRAMUSCULAR | Status: AC
  Filled 2015-06-04: qty 30

## 2015-06-04 MED ORDER — ENSURE ENLIVE PO LIQD
237.0000 mL | Freq: Two times a day (BID) | ORAL | Status: DC
  Administered 2015-06-05 (×2): 237 mL via ORAL

## 2015-06-04 MED ORDER — INSULIN ASPART 100 UNIT/ML ~~LOC~~ SOLN
0.0000 [IU] | Freq: Three times a day (TID) | SUBCUTANEOUS | Status: DC
  Administered 2015-06-04: 5 [IU] via SUBCUTANEOUS
  Administered 2015-06-04 – 2015-06-05 (×2): 3 [IU] via SUBCUTANEOUS
  Administered 2015-06-05: 5 [IU] via SUBCUTANEOUS
  Administered 2015-06-05: 3 [IU] via SUBCUTANEOUS
  Filled 2015-06-04 (×3): qty 3
  Filled 2015-06-04 (×2): qty 5

## 2015-06-04 MED ORDER — ALPRAZOLAM 0.25 MG PO TABS
0.2500 mg | ORAL_TABLET | Freq: Three times a day (TID) | ORAL | Status: DC | PRN
  Administered 2015-06-04 – 2015-06-05 (×3): 0.25 mg via ORAL
  Filled 2015-06-04 (×3): qty 1

## 2015-06-04 MED ORDER — TIOTROPIUM BROMIDE MONOHYDRATE 18 MCG IN CAPS
18.0000 ug | ORAL_CAPSULE | Freq: Every day | RESPIRATORY_TRACT | Status: DC
  Administered 2015-06-04 – 2015-06-05 (×2): 18 ug via RESPIRATORY_TRACT
  Filled 2015-06-04 (×2): qty 5

## 2015-06-04 MED ORDER — COLLAGENASE 250 UNIT/GM EX OINT
TOPICAL_OINTMENT | Freq: Every day | CUTANEOUS | Status: DC
  Administered 2015-06-05 – 2015-06-07 (×3): via TOPICAL

## 2015-06-04 MED ORDER — INSULIN ASPART 100 UNIT/ML ~~LOC~~ SOLN
0.0000 [IU] | Freq: Every day | SUBCUTANEOUS | Status: DC
  Administered 2015-06-04: 2 [IU] via SUBCUTANEOUS
  Filled 2015-06-04: qty 2

## 2015-06-04 MED ORDER — ALBUTEROL SULFATE (2.5 MG/3ML) 0.083% IN NEBU
2.5000 mg | INHALATION_SOLUTION | RESPIRATORY_TRACT | Status: DC | PRN
  Administered 2015-06-04: 2.5 mg via RESPIRATORY_TRACT
  Filled 2015-06-04: qty 3

## 2015-06-04 MED ORDER — POTASSIUM CHLORIDE CRYS ER 20 MEQ PO TBCR
40.0000 meq | EXTENDED_RELEASE_TABLET | Freq: Two times a day (BID) | ORAL | Status: AC
  Administered 2015-06-04 (×2): 40 meq via ORAL
  Filled 2015-06-04 (×2): qty 2

## 2015-06-04 MED ORDER — FUROSEMIDE 10 MG/ML IJ SOLN
40.0000 mg | Freq: Once | INTRAMUSCULAR | Status: AC
  Administered 2015-06-04: 40 mg via INTRAVENOUS
  Filled 2015-06-04: qty 4

## 2015-06-04 MED ORDER — FENTANYL CITRATE (PF) 100 MCG/2ML IJ SOLN
25.0000 ug | INTRAMUSCULAR | Status: DC | PRN
  Administered 2015-06-05: 50 ug via INTRAVENOUS
  Filled 2015-06-04: qty 2

## 2015-06-04 MED ORDER — ACETAZOLAMIDE SODIUM 500 MG IJ SOLR
500.0000 mg | Freq: Once | INTRAMUSCULAR | Status: AC
  Administered 2015-06-04: 500 mg via INTRAVENOUS
  Filled 2015-06-04: qty 500

## 2015-06-04 MED ORDER — FLUTICASONE FUROATE-VILANTEROL 100-25 MCG/INH IN AEPB
1.0000 | INHALATION_SPRAY | Freq: Every day | RESPIRATORY_TRACT | Status: DC
  Administered 2015-06-04: 1 via RESPIRATORY_TRACT
  Filled 2015-06-04: qty 28

## 2015-06-04 MED ORDER — AMIODARONE HCL 200 MG PO TABS
400.0000 mg | ORAL_TABLET | Freq: Two times a day (BID) | ORAL | Status: DC
  Administered 2015-06-04 – 2015-06-05 (×4): 400 mg via ORAL
  Filled 2015-06-04 (×4): qty 2

## 2015-06-04 MED ORDER — STERILE WATER FOR INJECTION IJ SOLN
INTRAMUSCULAR | Status: AC
  Administered 2015-06-04: 5 mL
  Filled 2015-06-04: qty 10

## 2015-06-04 MED ORDER — DIGOXIN 0.25 MG/ML IJ SOLN
INTRAMUSCULAR | Status: AC
  Filled 2015-06-04: qty 2

## 2015-06-04 MED ORDER — SODIUM CHLORIDE 0.9 % IV SOLN
Freq: Once | INTRAVENOUS | Status: AC
  Administered 2015-06-04: 15:00:00 via INTRAVENOUS

## 2015-06-04 NOTE — Progress Notes (Signed)
IV access obtained, blood restarted.

## 2015-06-04 NOTE — Care Management (Addendum)
Patient extubated 5/16 and now in SDU on 3 liters O2 per [place] pending angiogram/I & D with podiatry. [facility] cannot take patient due to vascular surgery need. Unsure at this time if [facility] can do surgery however cannot meet vascular needs[facility] notified of potential need for VAC, IV ABX, and/or home O2 if this is acute. RNCM will continue to follow.

## 2015-06-04 NOTE — Progress Notes (Signed)
Pt converted back to sinus rhythm on the cardiac monitor.

## 2015-06-04 NOTE — Progress Notes (Signed)
Patient rude and using curse words talking to nurse stating he is mad that the NT would not let him get up out of bed.  RN explained to patient that is the reason PT is here in his room to assess him and get him up first and then other staff can assist him up if it is safe.  Patient continued to rant about not being able to get up earlier but verbalized understanding of reason why.  RN asked patient to not use curse words when talking with her about staff and explained that the nursing staff are here to help him.  PT at bedside to work with patient.

## 2015-06-04 NOTE — Progress Notes (Signed)
Nutrition Follow-up  DOCUMENTATION CODES:   Not applicable  INTERVENTION:  -Cater to pt preferences -Pt with increased nutritional needs due to acute illness, multiple infected ulcerations on foot with plans for surgical intervention. Recommend addition of Ensure Enlive po BID, each supplement provides 350 kcal and 20 grams of protein  NUTRITION DIAGNOSIS:   Inadequate oral intake related to inability to eat as evidenced by NPO status.  Being addressed as diet advanced post extubation  GOAL:   Patient will meet greater than or equal to 90% of their needs  MONITOR:   PO intake, Supplement acceptance, Labs, Weight trends  REASON FOR ASSESSMENT:   Ventilator    ASSESSMENT:   Pt admitted with urosepsis as well as chronic lower extremity diabetic foot ulcers. Podiatry consult placed; per MD note likely surgery tomorrow for I&D and possible amputations.  Pt s/p extubation on 01/22/15.Plan for angiogram when able, noted pt will likely require debdridement of residual necrotic and infected tissue post procedure as well  Diet Order:  DIET SOFT Room service appropriate?: Yes; Fluid consistency:: Thin   Energy Intake: Soft diet ordered this AM, tolerating although per Brittney RN, pt does not really like the hospital food. No recorded po intake  Skin:   (Multiple Stage III and Unstageable pressure uclers of foot)  Last BM:  05/30/15   Labs:   Glucose Profile:   Recent Labs  06/04/15 0355 06/04/15 0803 06/04/15 1124  GLUCAP 191* 159* 203*   Meds: ss novolog, potassium chloride, lasix  Height:   Ht Readings from Last 1 Encounters:  001/04/17 5\' 10"  (1.778 m)    Weight:   Wt Readings from Last 1 Encounters:  06/04/15 187 lb 9.8 oz (85.1 kg)    BMI:  Body mass index is 26.92 kg/(m^2).  Estimated Nutritional Needs:   Kcal:  2125-2550 kcals  Protein:  >/= 128 g  Fluid:  >2L fluid  EDUCATION NEEDS:   No education needs identified at this time  Romelle StarcherCate Kaylen Motl  MS, RD, LDN [phone] Pager  [phone] Weekend/On-Call Pager

## 2015-06-04 NOTE — Progress Notes (Signed)
PULMONARY / CRITICAL CARE MEDICINE   Name: Johnny RastDavid Lane MRN: [id number] DOB: 08/19/1950    ADMISSION DATE:  May 18, 2015   CONSULTATION DATE:  05/31/2015  REFERRING MD:  Hospitalists  PT PROFILE:  [age] M with severe cardiomyopathy, DM2, PVD, chronic venous stasis admitted 5/11 with dx of cellulitis, sepsis. Initial cardiac markers were mildly elevated. On morning of 5/13 found pulseless/PEA arrest. Intubated and transferred to ICU. PCCM assumed care. Extubated 05/16 and cognition intact.  MAJOR EVENTS/TEST RESULTS: 05/11 admitted as above. Cardiac markers elevated (trop I 1.85) 05/12 TTE: LVEF 25-30% 05/12 MRI L foot: Large skin ulcerations and areas of debridement on the left foot. Negative for osteomyelitis or septic joint 05/12 LE venous US: Sonographic survey bilateral lower extremities negative for DVT 05/13 PEA arrest - prolonged resuscitation. Intubated 05/16 Extubated to [place] O2. AFRVR developed in evening - amiodarone infusion initiated 05/17 Remained dyspneic. CXR still c/w volume overload. Hgb 7.0. One unit PRBCs ordered. Changed to SDU status  INDWELLING DEVICES:: ETT 05/13 >> 05/16 R IJ CVl 05/13 >>   MICRO DATA: All micro reviewed L foot wound 05/12 >> moderate growth Groups G and C strep, light growth enterobacter   ANTIMICROBIALS:  Vanc 05/11 >> 05/15 Pip-tazo 05/11 >> 05/15 Cefazolin 05/15 >> 05/16 Ceftriaxone 05/16 >>    SUBJECTIVE:  RASS 0, + F/C. Dyspneic with speech and minimal exertion. Sometimes unpleasant with RN staff  VITAL SIGNS: BP 104/74 mmHg  Pulse 74  Temp(Src) 97.1 F (36.2 C) (Oral)  Resp 30  Ht 5\' 10"  (1.778 m)  Wt 85.1 kg (187 lb 9.8 oz)  BMI 26.92 kg/m2  SpO2 98%  HEMODYNAMICS:    VENTILATOR SETTINGS:    INTAKE / OUTPUT: I/O last 3 completed shifts: In: 2472.6 [P.O.:120; I.V.:1402.6; NG/GT:800; IV Piggyback:150] Out: 2350 [Urine:2350]  PHYSICAL EXAMINATION: NAD HEENT WNL BS diminished in B bases, no wheezes Reg, no  M NABS, soft Severe chronic stasis changes L foot in surgical dressing No focal neuro deficits  LABS:  BMET  Recent Labs Lab 06/02/15 0402 06/03/15 0401 06/04/15 0500  NA 144 143 143  K 3.2* 4.0 3.9  CL 109 106 104  CO2 31 34* 33*  BUN 47* 39* 41*  CREATININE 1.33* 1.17 1.19  GLUCOSE 139* 195* 196*    Electrolytes  Recent Labs Lab 05/31/15 0417 05/20/2015 0420 06/02/15 0402 06/03/15 0401 06/04/15 0500  CALCIUM 8.3* 7.9* 7.4* 7.5* 7.5*  MG 2.4 2.5* 2.3  --   --   PHOS 5.3* 2.5 2.2*  --   --     CBC  Recent Labs Lab 06/02/15 0402 06/03/15 0401 06/04/15 0500  WBC 14.8* 12.2* 11.8*  HGB 7.1* 7.1* 7.0*  HCT 22.2* 22.1* 22.3*  PLT 144* 157 190    Coag's  Recent Labs Lab October 09, 2015 1210 05/30/15 0439 05/31/15 0417  APTT 37*  --   --   INR 1.22 1.08 1.06    Sepsis Markers  Recent Labs Lab October 09, 2015 1210 October 09, 2015 1626 05/31/15 0417 06/03/15 0401 06/04/15 0500  LATICACIDVEN 2.2* 1.9 3.5*  --   --   PROCALCITON  --   --  8.20 1.90 1.12    ABG  Recent Labs Lab 05/31/15 2300 06/14/2015 0436 06/02/15 0500  PHART 7.49* 7.49* 7.56*  PCO2ART 46 45 39  PO2ART 144* 114* 74*    Liver Enzymes  Recent Labs Lab October 09, 2015 1210 05/30/15 0439 06/02/15 0402  AST 143* 64* 742*  ALT 77* 71* 521*  ALKPHOS 55 49 57  BILITOT 1.1 0.9 1.3*  ALBUMIN 2.7* 2.3* 1.8*    Cardiac Enzymes  Recent Labs Lab 05/31/15 0417 05/31/15 0812 05/31/15 1556  TROPONINI 0.66* 1.04* 1.56*    Glucose  Recent Labs Lab 06/03/15 1132 06/03/15 1614 06/03/15 1938 06/03/15 2332 06/04/15 0355 06/04/15 0803  GLUCAP 118* 185* 181* 199* 191* 159*    CXR: NSC edema pattern with B effusions   ASSESSMENT / PLAN:  PULMONARY A: Acute hypoxic respiratory failure Pulmonary edema B pleural effusions P:   Monitor in SDU post extubation Cont supplemental O2 to maintain SpO2 > 92% Change nebs to Breo and Spiriva with PRN albuterol  CARDIOVASCULAR A:  Elevated  cardiac markers - peak troponin 1.85 (05/11) S/P bradycardic PEA X 2 arrest 05/13 Severe ischemic cardiomyopathy History of hypertension PAF with RVR (now sinus with LBBB) Severe PVD with L foot ischemia P:  Change amiodarone to PO Monitor BP and rhythm  RENAL A:   AKI, resolved Likely some component of CKD Hypervolemia Loop diuretic induced metabolic acidosis - resolved P:   Monitor BMET intermittently Monitor I/Os Correct electrolytes as indicated Acetazolamide X 1 and furosemide X 1 05/17  GASTROINTESTINAL A:   No acute issues P:   SUP not indicated post extubation Begin diet  HEMATOLOGIC A:   Anemia without overt bleeding P:  DVT px: LMWH Monitor CBC intermittently Transfuse per usual guidelines  One unit PRBCs 05/17 for Hgb 7.0  INFECTIOUS A:   LLE wound infection P:   Monitor temp, WBC count Micro and abx as above  ENDOCRINE A:   DM 2, controlled P:   Change SSI to ACHS  NEUROLOGIC A:   Acute encephalopathy, resolved  Chronic pain Agitation P:   RASS goal: 0 Cont PRN fentanyl Low dose PRN alprazolam initiated 05/17  Disposition and family update:  No family at bedside this am    [name], MD PCCM service Mobile [phone] Pager [phone]    06/04/2015, 10:57 AM

## 2015-06-04 NOTE — Progress Notes (Signed)
eLink Physician-Brief Progress Note Patient Name: Johnny RastDavid Lane DOB: 10/03/1950 MRN: [id number]   Date of Service  06/04/2015  HPI/Events of Note  Getting blood.  Pulmonary edema.  eICU Interventions  Lasix 40 mg IV x1.     Intervention Category Major Interventions: Other:  Nicholes Hibler 06/04/2015, 7:13 PM

## 2015-06-04 NOTE — Progress Notes (Signed)
[facility]  Daily Progress Note   Subjective  - 3 Days Post-Op  Had A. Fib with RVR overnight Was very combative and agitated last night, seems calmer now  Objective Filed Vitals:   06/04/15 0600 06/04/15 0700 06/04/15 0747 06/04/15 0800  BP: 113/58 115/74  104/74  Pulse: 72 77  74  Temp:      TempSrc:      Resp: 23 22  30   Height:      Weight:      SpO2: 97% 88% 100% 98%    Intake/Output Summary (Last 24 hours) at 06/04/15 0956 Last data filed at 06/04/15 0823  Gross per 24 hour  Intake   1015 ml  Output    750 ml  Net    265 ml    PULM  Course bilaterally CV  RRR VASC  Ulcerations bilaterally with non-palpable pedal pulses  Laboratory CBC    Component Value Date/Time   WBC 11.8* 06/04/2015 0500   WBC 8.8 12/25/2013 1136   HGB 7.0* 06/04/2015 0500   HGB 14.2 12/25/2013 1136   HCT 22.3* 06/04/2015 0500   HCT 43.7 12/25/2013 1136   PLT 190 06/04/2015 0500   PLT 210 12/25/2013 1136    BMET    Component Value Date/Time   NA 143 06/04/2015 0500   NA 140 12/25/2013 1136   K 3.9 06/04/2015 0500   K 3.9 12/25/2013 1136   CL 104 06/04/2015 0500   CL 107 12/25/2013 1136   CO2 33* 06/04/2015 0500   CO2 30 12/25/2013 1136   GLUCOSE 196* 06/04/2015 0500   GLUCOSE 269* 12/25/2013 1136   BUN 41* 06/04/2015 0500   BUN 21* 12/25/2013 1136   CREATININE 1.19 06/04/2015 0500   CREATININE 0.71 12/25/2013 1136   CALCIUM 7.5* 06/04/2015 0500   CALCIUM 8.4* 12/25/2013 1136   GFRNONAA >60 06/04/2015 0500   GFRNONAA >60 12/25/2013 1136   GFRAA >60 06/04/2015 0500   GFRAA >60 12/25/2013 1136    Assessment/Planning: PAD with ulceration BLE   Was scheduled for angio today, but had A. Fib with RVR overnight after PEA arrest over the weekend  Critical care team does not feel patient should undergo any invasive procedure at this time, so angiogram will be put on hold  Will likely be some time next week before angiogram can be done at this  point, but will follow along and please call with questions.  Continue local wound care    DEW,JASON  06/04/2015, 9:56 AM

## 2015-06-04 NOTE — Progress Notes (Signed)
Annice PihJackie, NT entered room and saw blood on patient's gown and neck.  NT called RN to come and RN went to room and patient had pulled his central line out and peripheral IV out along with pulling some ECG leads off and pulse ox.  Blood on bed pad and gown.  Packed red blood cells were infusing. RN stopped blood and there was no blood noted coming from where IJ had been in place.  NT cleaned patient up and this RN attempted to start peripheral IV so blood could finish infusing but unsuccessful.  Staci, RN attempting to start IV at this time.  Patient is alert answering all questions appropriately.  NSR rate 70's.  RN asked patient why he pulled his IV's out and he stated that he didn't.  Blood pressure stable 145/68.  Continuing to monitor.  Will notify [facility].

## 2015-06-04 NOTE — Evaluation (Signed)
Physical Therapy Evaluation Patient Details Name: Johnny RastDavid Lane MRN: [id number] DOB: 12/12/1950 Today's Date: 06/04/2015   History of Present Illness  Patient is a 65 y/o male that presents with worsening mental status, likely from sepsis. He has chronic non-healing wounds on LLE, needs angiogram when more stable. [facility] has had 2 PEA events requiring CPR, extubated on 5/16.   Clinical Impression  Patient admitted with multiple medical co-morbidities and had 2 separate incidents of cardiac arrest on this hospitalization. He is quite limited at baseline in terms of mobility, however he is now more deconditioned than baseline and desaturates down to 60s-70s on telemetry monitor after attempt at standing on 2L of O2, required 5+ minutes to return to 88+% O2 sats with RN in room and O2 increased. Patient is very adamant all mobility is performed his way and is rude to staff at various points throughout this session. He claims he was able to make it to bedside commode last evening, though given his performance with sit to stand today this is unlikely. He is requiring a substantial amount of physical assistance to complete even bed mobility and with his multiple co-morbidities is likely more appropriate for LTAC/SNF placement pending medical course. Requiring max A x2 to complete standing and given his trunkal flexion and posterior weight shift is un-safe to attempt any ambulation to bedside commode.     Follow Up Recommendations LTACH    Equipment Recommendations   (To be determined at next facility)    Recommendations for Other Services       Precautions / Restrictions Precautions Precautions: Fall Restrictions Weight Bearing Restrictions: No      Mobility  Bed Mobility Overal bed mobility: Needs Assistance;+2 for physical assistance Bed Mobility: Supine to Sit;Sit to Supine     Supine to sit: Mod assist;+2 for physical assistance Sit to supine: Mod assist;+2 for physical  assistance   General bed mobility comments: Patient is generally weak in upper body from chest compressions, prolonged bed rest and requires significant assistance at both LEs and torso to transfer to EOB.   Transfers Overall transfer level: Needs assistance Equipment used: Rolling walker (2 wheeled) Transfers: Sit to/from Stand Sit to Stand: Max assist;+2 physical assistance         General transfer comment: Able to transfer sit to stand with max A x 2 secondary to LE weakness, unsafe to take any steps at this time.   Ambulation/Gait                Stairs            Wheelchair Mobility    Modified Rankin (Stroke Patients Only)       Balance Overall balance assessment: Needs assistance Sitting-balance support: Bilateral upper extremity supported Sitting balance-Leahy Scale: Fair     Standing balance support: Bilateral upper extremity supported Standing balance-Leahy Scale: Poor                               Pertinent Vitals/Pain Pain Assessment:  (Patient does not report any pain in this session.)    Home Living Family/patient expects to be discharged to:: Private residence Living Arrangements: Alone   Type of Home: House Home Access: Stairs to enter Entrance Stairs-Rails: None Entrance Stairs-Number of Steps: 1 Home Layout: One level Home Equipment: Walker - 2 wheels      Prior Function Level of Independence: Needs assistance  Comments: Patient recently discharged from rehab "too early" he reports. He was ambulating very short distances prior to this admission, though progressively declining due to increasing foot pain. He has aides and home health nursing that assist him with wound care and basic ADLs.      Hand Dominance        Extremity/Trunk Assessment               Lower Extremity Assessment: Generalized weakness;RLE deficits/detail;LLE deficits/detail RLE Deficits / Details: Able to complete SLRs and heel  slides without external assistance. significant discoloration throughout distal RLE.  LLE Deficits / Details: Requires AAROM to complete heel slides and SLRs. Wound wrapped on LLE     Communication   Communication: No difficulties  Cognition Arousal/Alertness: Awake/alert Behavior During Therapy: Agitated Overall Cognitive Status: Difficult to assess (Patient is very adamant things operate in the way he sees fit. He is quite rude to staff members regarding his wishes and staff concerns. )                      General Comments      Exercises General Exercises - Lower Extremity Heel Slides: AROM;Left;AAROM;Right;10 reps Straight Leg Raises: AROM;Right;AAROM;Left;10 reps Other Exercises Other Exercises: Attempted x 2 to assist patient to bedside commode which he is convinced he transferred to previous evening. Patient was quite demanding throughout this process informing PT he wanted to do things in his way/order. Unsafe/unable to perform transfer at this time.       Assessment/Plan    PT Assessment Patient needs continued PT services  PT Diagnosis Difficulty walking;Generalized weakness   PT Problem List Decreased strength;Decreased knowledge of use of DME;Decreased skin integrity;Decreased safety awareness;Decreased activity tolerance;Cardiopulmonary status limiting activity;Decreased balance;Decreased mobility;Impaired sensation  PT Treatment Interventions DME instruction;Gait training;Stair training;Therapeutic activities;Therapeutic exercise;Balance training   PT Goals (Current goals can be found in the Care Plan section) Acute Rehab PT Goals Patient Stated Goal: To become more mobile  PT Goal Formulation: With patient Time For Goal Achievement: 06/18/15 Potential to Achieve Goals: Fair    Frequency Min 2X/week   Barriers to discharge Decreased caregiver support      Co-evaluation               End of Session Equipment Utilized During Treatment: Gait  belt;Oxygen Activity Tolerance: Patient limited by fatigue;Treatment limited secondary to medical complications (Comment) (O2 sat decline ) Patient left: in bed;with call bell/phone within reach;with nursing/sitter in room Nurse Communication: Mobility status (Low O2 sats)         Time: 1400-1450 PT Time Calculation (min) (ACUTE ONLY): 50 min   Charges:   PT Evaluation $PT Eval High Complexity: 1 Procedure PT Treatments $Therapeutic Activity: 23-37 mins (Prolonged time attempting to get to [facility], adjusting in bed after session)   PT G Codes:       Kerin Ransom, PT, DPT    06/04/2015, 6:01 PM

## 2015-06-04 NOTE — Progress Notes (Signed)
[facility] with [facility] and made MD aware of patient pulling his Central line out and PIV while blood transfusion was going and that RN was able to finally start new IV and blood is finishing infusing at this time and almost complete but that patient is short of breath with wheezes that are from upper airway and that breathing treatment had been given already with minimal improvement per patient.  RN made MD aware that it did not appear to be a lot of blood loss from central line being pulled out.  Patient A&O but more sleepy than earlier in shift.  RN asked MD about giving lasix? MD stated to give 40mg  of IV lasix once. No further orders given.  This RN gave Imma, RN report who is now taking over patient's care.

## 2015-06-05 ENCOUNTER — Other Ambulatory Visit: Payer: Medicare Other

## 2015-06-05 ENCOUNTER — Inpatient Hospital Stay: Payer: Medicare Other

## 2015-06-05 DIAGNOSIS — I739 Peripheral vascular disease, unspecified: Secondary | ICD-10-CM

## 2015-06-05 LAB — TYPE AND SCREEN
ABO/RH(D): O POS
Antibody Screen: NEGATIVE
UNIT DIVISION: 0

## 2015-06-05 LAB — BASIC METABOLIC PANEL
Anion gap: 8 (ref 5–15)
BUN: 43 mg/dL — AB (ref 6–20)
CALCIUM: 8 mg/dL — AB (ref 8.9–10.3)
CHLORIDE: 105 mmol/L (ref 101–111)
CO2: 31 mmol/L (ref 22–32)
CREATININE: 1.22 mg/dL (ref 0.61–1.24)
Glucose, Bld: 231 mg/dL — ABNORMAL HIGH (ref 65–99)
Potassium: 3.9 mmol/L (ref 3.5–5.1)
SODIUM: 144 mmol/L (ref 135–145)

## 2015-06-05 LAB — BLOOD GAS, ARTERIAL
Acid-Base Excess: 8.6 mmol/L — ABNORMAL HIGH (ref 0.0–3.0)
BICARBONATE: 35.6 meq/L — AB (ref 21.0–28.0)
Delivery systems: POSITIVE
EXPIRATORY PAP: 6
FIO2: 1
Inspiratory PAP: 18
Mechanical Rate: 12
O2 SAT: 86.9 %
PATIENT TEMPERATURE: 37
PCO2 ART: 63 mmHg — AB (ref 32.0–48.0)
PH ART: 7.36 (ref 7.350–7.450)
PO2 ART: 55 mmHg — AB (ref 83.0–108.0)

## 2015-06-05 LAB — GLUCOSE, CAPILLARY
GLUCOSE-CAPILLARY: 171 mg/dL — AB (ref 65–99)
GLUCOSE-CAPILLARY: 213 mg/dL — AB (ref 65–99)
GLUCOSE-CAPILLARY: 221 mg/dL — AB (ref 65–99)
Glucose-Capillary: 217 mg/dL — ABNORMAL HIGH (ref 65–99)
Glucose-Capillary: 212 mg/dL — ABNORMAL HIGH (ref 65–99)
Glucose-Capillary: 154 mg/dL — ABNORMAL HIGH (ref 65–99)
Glucose-Capillary: 128 mg/dL — ABNORMAL HIGH (ref 65–99)

## 2015-06-05 LAB — CULTURE, BLOOD (ROUTINE X 2): Culture: NO GROWTH

## 2015-06-05 LAB — CBC
HCT: 27.2 % — ABNORMAL LOW (ref 40.0–52.0)
Hemoglobin: 8.8 g/dL — ABNORMAL LOW (ref 13.0–18.0)
MCH: 27.7 pg (ref 26.0–34.0)
MCHC: 32.5 g/dL (ref 32.0–36.0)
MCV: 85.2 fL (ref 80.0–100.0)
PLATELETS: 277 10*3/uL (ref 150–440)
RBC: 3.19 MIL/uL — AB (ref 4.40–5.90)
RDW: 15.9 % — AB (ref 11.5–14.5)
WBC: 11.5 10*3/uL — AB (ref 3.8–10.6)

## 2015-06-05 MED ORDER — ALBUTEROL SULFATE (2.5 MG/3ML) 0.083% IN NEBU
2.5000 mg | INHALATION_SOLUTION | RESPIRATORY_TRACT | Status: DC | PRN
  Administered 2015-06-07: 2.5 mg via RESPIRATORY_TRACT
  Filled 2015-06-05: qty 3

## 2015-06-05 MED ORDER — ALPRAZOLAM 0.25 MG PO TABS: 0.2500 mg | ORAL_TABLET | Freq: Three times a day (TID) | ORAL | Status: DC | PRN

## 2015-06-05 MED ORDER — BUDESONIDE 0.25 MG/2ML IN SUSP
0.2500 mg | Freq: Four times a day (QID) | RESPIRATORY_TRACT | Status: DC
  Administered 2015-06-05 – 2015-06-06 (×3): 0.25 mg via RESPIRATORY_TRACT
  Filled 2015-06-05 (×3): qty 2

## 2015-06-05 MED ORDER — MORPHINE SULFATE (PF) 2 MG/ML IV SOLN: 2.0000 mg | INTRAVENOUS | Status: DC | PRN

## 2015-06-05 MED ORDER — BUDESONIDE 0.5 MG/2ML IN SUSP
0.5000 mg | Freq: Two times a day (BID) | RESPIRATORY_TRACT | Status: DC
  Administered 2015-06-05: 0.5 mg via RESPIRATORY_TRACT
  Filled 2015-06-05: qty 2

## 2015-06-05 MED ORDER — FUROSEMIDE 10 MG/ML IJ SOLN
INTRAMUSCULAR | Status: AC
  Administered 2015-06-05: 80 mg via INTRAVENOUS
  Filled 2015-06-05: qty 8

## 2015-06-05 MED ORDER — ALBUTEROL SULFATE (2.5 MG/3ML) 0.083% IN NEBU
2.5000 mg | INHALATION_SOLUTION | RESPIRATORY_TRACT | Status: DC
  Administered 2015-06-05 (×2): 2.5 mg via RESPIRATORY_TRACT
  Filled 2015-06-05 (×2): qty 3

## 2015-06-05 MED ORDER — IPRATROPIUM-ALBUTEROL 0.5-2.5 (3) MG/3ML IN SOLN
3.0000 mL | Freq: Four times a day (QID) | RESPIRATORY_TRACT | Status: DC
  Administered 2015-06-05 – 2015-06-08 (×12): 3 mL via RESPIRATORY_TRACT
  Filled 2015-06-05 (×13): qty 3

## 2015-06-05 MED ORDER — MORPHINE SULFATE (PF) 2 MG/ML IV SOLN
INTRAVENOUS | Status: AC
  Administered 2015-06-05: 2 mg via INTRAVENOUS
  Filled 2015-06-05: qty 1

## 2015-06-05 MED ORDER — FENTANYL CITRATE (PF) 100 MCG/2ML IJ SOLN
25.0000 ug | INTRAMUSCULAR | Status: DC | PRN
  Administered 2015-06-05: 50 ug via INTRAVENOUS
  Filled 2015-06-05: qty 2

## 2015-06-05 MED ORDER — FUROSEMIDE 10 MG/ML IJ SOLN
80.0000 mg | Freq: Once | INTRAMUSCULAR | Status: AC
  Administered 2015-06-05: 80 mg via INTRAVENOUS

## 2015-06-05 MED ORDER — MORPHINE SULFATE (PF) 2 MG/ML IV SOLN
2.0000 mg | Freq: Once | INTRAVENOUS | Status: AC
Start: 2015-06-05 — End: 2015-06-05
  Administered 2015-06-05: 2 mg via INTRAVENOUS

## 2015-06-05 NOTE — Clinical Social Work Note (Signed)
Clinical Social Worker consulted for possible placement. CSW reviewed chart. Currently pt is on BiPap and not appropriate for assessment at this time. PT is recommending LTAC and possible SNF. RNCM is following discharge planning as well. Pt is open to home health services. CSW will complete assessment when appropriate, however will continue to follow. Please call with any urgent needs.   Dede QuerySarah Manila Rommel, MSW, LCSW  Clinical Social Worker  [phone]

## 2015-06-05 NOTE — Progress Notes (Signed)
Called to pt's room for low Sat. Pt O2 sat was 60% on NRB. Pt placed on Bipap. Tolerating well.

## 2015-06-05 NOTE — Progress Notes (Signed)
Chaplain rounded the unit and provided a compassionate presence and support for to the patient through silent prayer. Patient appeared to be sleeping. Chaplain Sharbel Sahagun [phone] 

## 2015-06-05 NOTE — Plan of Care (Signed)
Problem: Fluid Volume: Goal: Hemodynamic stability will improve Outcome: Not Progressing Resp distress and on BiPAP now

## 2015-06-05 NOTE — Progress Notes (Signed)
Inpatient Diabetes Program Recommendations  AACE/ADA: New Consensus Statement on Inpatient Glycemic Control (2015)  Target Ranges:  Prepandial:   less than 140 mg/dL      Peak postprandial:   less than 180 mg/dL (1-2 hours)      Critically ill patients:  140 - 180 mg/dL  Results for Johnny Lane, Johnny Lane (MRN [id number]) as of 06/05/2015 10:21  Ref. Range 06/04/2015 08:03 06/04/2015 11:24 06/04/2015 16:15 06/04/2015 18:23 06/04/2015 20:02 06/05/2015 00:34 06/05/2015 02:13 06/05/2015 04:30 06/05/2015 07:46  Glucose-Capillary Latest Ref Range: 65-99 mg/dL 409159 (H) 811203 (H) 914213 (H) 194 (H) 202 (H) 213 (H) 221 (H) 217 (H) 212 (H)   Review of Glycemic Control  Diabetes history: DM2 Outpatient Diabetes medications: Lantus 60 units QHS Current orders for Inpatient glycemic control: Novolog 0-15 units TID with meals, Novolog 0-5 units QHS  Inpatient Diabetes Program Recommendations: Insulin - Basal: Please consider ordering low dose basal insulin. Recommend starting with Lantus 13 units Q24H (based on 85 kg x 0.15 units) starting now.  Thanks, Orlando PennerMarie Parys Elenbaas, RN, MSN, CDE Diabetes Coordinator Inpatient Diabetes Program [phone] (Team Pager from 8am to 5pm) [phone] ([facility] office) [phone] [facility] office) [phone] [facility] office)

## 2015-06-05 NOTE — Progress Notes (Signed)
Physical Therapy Treatment Patient Details Name: Johnny RastDavid Gilliam MRN: [id number] DOB: 09/26/1950 Today's Date: 06/05/2015    History of Present Illness Patient is a 65 y/o male that presents with worsening mental status, likely from sepsis. He has chronic non-healing wounds on LLE, needs angiogram when more stable. [facility] 2 PEA events requiring CPR, extubated on 5/16.     PT Comments    Chart reviewed prior to session.  Discussed with nurse prior to session and received ok for gentle ROM in bed.  Pt participated in minimal supine LE exercises 2 x 5 ankle pumps and 2 x 5 hip/knee flexion.  Pt given and took frequent extended rest breaks.  Pt fatigued with minimal exercises.  Relayed request for food/drink to primary nurse.     Follow Up Recommendations  LTACH     Equipment Recommendations       Recommendations for Other Services       Precautions / Restrictions Precautions Precautions: Fall Restrictions Weight Bearing Restrictions: No    Mobility  Bed Mobility                  Transfers                    Ambulation/Gait                 Stairs            Wheelchair Mobility    Modified Rankin (Stroke Patients Only)       Balance                                    Cognition Arousal/Alertness: Awake/alert Behavior During Therapy: WFL for tasks assessed/performed Overall Cognitive Status: Within Functional Limits for tasks assessed                      Exercises Other Exercises Other Exercises: minimal toe taps and hip flexion in supine    General Comments        Pertinent Vitals/Pain Pain Assessment: Faces Pain Score: 5  Faces Pain Scale: Hurts little more Pain Location: back Pain Intervention(s): Repositioned    Home Living                      Prior Function            PT Goals (current goals can now be found in the care plan section)      Frequency  Min 2X/week     PT Plan Current plan remains appropriate    Co-evaluation             End of Session   Activity Tolerance: Patient limited by fatigue Patient left: in bed;with call bell/phone within reach;with bed alarm set     Time: 1448-1500 PT Time Calculation (min) (ACUTE ONLY): 12 min  Charges:  $Therapeutic Exercise: 8-22 mins                    G Codes:      Danielle DessSarah Aleasha Fregeau, PTA 06/05/2015, 4:31 PM

## 2015-06-05 NOTE — Progress Notes (Signed)
eLink Physician-Brief Progress Note Patient Name: Johnny RastDavid Lane DOB: 12/26/1950 MRN: [id number]   Date of Service  06/05/2015  HPI/Events of Note  Having trouble with agitation on the bipap, some better p xanax 0.25   eICU Interventions  Ok to try xanax x 0.5 and fentanyl in low doses as a back up but if not an effective combination would change to haldol/fantanyl prn IV     Intervention Category Major Interventions: Delirium, psychosis, severe agitation - evaluation and management  Sandrea HughsMichael Hajira Verhagen 06/05/2015, 11:00 PM

## 2015-06-05 NOTE — Progress Notes (Signed)
eLink Physician-Brief Progress Note Patient Name: Winfield RastDavid Pizzuto DOB: 11/09/1950 MRN: [id number]   Date of Service  06/05/2015  HPI/Events of Note  Patient with acute resp distress, extubated 2 days ago, EF of 25%, high risk for re-intubation  eICU Interventions  1.will start biPAP 2.will give lasix 80 mg x 1 3.cxr stat      Intervention Category Major Interventions: Respiratory failure - evaluation and management  Gradyn Shein 06/05/2015, 2:15 AM

## 2015-06-05 NOTE — Progress Notes (Signed)
PULMONARY / CRITICAL CARE MEDICINE   Name: Johnny Lane MRN: [id number] DOB: 10-09-1950    ADMISSION DATE:  06/10/2015   CONSULTATION DATE:  05/31/2015  REFERRING MD:  Hospitalists  PT PROFILE:  19 M with severe cardiomyopathy, DM2, PVD, chronic venous stasis admitted 5/11 with dx of cellulitis, sepsis. Initial cardiac markers were mildly elevated. On morning of 5/13 found pulseless/PEA arrest. Intubated and transferred to ICU. PCCM assumed care. Extubated 05/16 and cognition intact.  MAJOR EVENTS/TEST RESULTS: 05/11 admitted as above. Cardiac markers elevated (trop I 1.85) 05/12 TTE: LVEF 25-30% 05/12 MRI L foot: Large skin ulcerations and areas of debridement on the left foot. Negative for osteomyelitis or septic joint 05/12 LE venous US: Sonographic survey bilateral lower extremities negative for DVT 05/13 PEA arrest - prolonged resuscitation. Intubated 05/16 Extubated to [place] O2. AFRVR developed in evening - amiodarone infusion initiated 05/17 Remained dyspneic. CXR still c/w volume overload. Hgb 7.0. One unit PRBCs ordered. Changed to SDU status 05/18 Progressive respiratory distress and hypoxemia. CXR with compete opacification of L side. BiPAP initiated with improvement. Code status discussed with pt > he would undergo re-intubation but short term only. No trach tube. He would undergo ACLS again but nothing prolonged  INDWELLING DEVICES:: ETT 05/13 >> 05/16 R IJ CVl 05/13 >> 05/16  MICRO DATA: All micro reviewed L foot wound 05/12 >> moderate growth Groups G and C strep, light growth enterobacter   ANTIMICROBIALS:  Vanc 05/11 >> 05/15 Pip-tazo 05/11 >> 05/15 Cefazolin 05/15 >> 05/16 Ceftriaxone 05/16 >>    SUBJECTIVE:  RASS 0, + F/C. Oriented X 3. Adequately supported on BiPAP  VITAL SIGNS: BP 149/76 mmHg  Pulse 74  Temp(Src) 97.1 F (36.2 C) (Axillary)  Resp 29  Ht  (1.778 m)  Wt 85.1 kg (187 lb 9.8 oz)  BMI 26.92 kg/m2  SpO2 90%  HEMODYNAMICS:     VENTILATOR SETTINGS: Vent Mode:  [-]  FiO2 (%):  [70 %-100 %] 70 %  INTAKE / OUTPUT: I/O last 3 completed shifts: In: 1000 [I.V.:560; Blood:390; IV Piggyback:50] Out: 2875 [Urine:2875]  PHYSICAL EXAMINATION: NAD HEENT WNL BS diminished on L, no wheezes Reg, no M NABS, soft Severe chronic stasis changes L foot in surgical dressing No focal neuro deficits  LABS:  BMET  Recent Labs Lab 06/03/15 0401 06/04/15 0500 06/05/15 0922  NA 143 143 144  K 4.0 3.9 3.9  CL 106 104 105  CO2 34* 33* 31  BUN 39* 41* 43*  CREATININE 1.17 1.19 1.22  GLUCOSE 195* 196* 231*    Electrolytes  Recent Labs Lab 05/31/15 0417 06/15/15 0420 06/02/15 0402 06/03/15 0401 06/04/15 0500 06/05/15 0922  CALCIUM 8.3* 7.9* 7.4* 7.5* 7.5* 8.0*  MG 2.4 2.5* 2.3  --   --   --   PHOS 5.3* 2.5 2.2*  --   --   --     CBC  Recent Labs Lab 06/03/15 0401 06/04/15 0500 06/05/15 0922  WBC 12.2* 11.8* 11.5*  HGB 7.1* 7.0* 8.8*  HCT 22.1* 22.3* 27.2*  PLT 157 190 277    Coag's  Recent Labs Lab 06/07/2015 1210 05/30/15 0439 05/31/15 0417  APTT 37*  --   --   INR 1.22 1.08 1.06    Sepsis Markers  Recent Labs Lab 05/26/2015 1210 05/23/2015 1626 05/31/15 0417 06/03/15 0401 06/04/15 0500  LATICACIDVEN 2.2* 1.9 3.5*  --   --   PROCALCITON  --   --  8.20 1.90 1.12    ABG  Recent Labs Lab 05/23/2015 0436 06/02/15 0500 06/05/15 0303  PHART 7.49* 7.56* 7.36  PCO2ART 45 39 63*  PO2ART 114* 74* 55*    Liver Enzymes  Recent Labs Lab 05/19/2015 1210 05/30/15 0439 06/02/15 0402  AST 143* 64* 742*  ALT 77* 71* 521*  ALKPHOS 55 49 57  BILITOT 1.1 0.9 1.3*  ALBUMIN 2.7* 2.3* 1.8*    Cardiac Enzymes  Recent Labs Lab 05/31/15 0417 05/31/15 0812 05/31/15 1556  TROPONINI 0.66* 1.04* 1.56*    Glucose  Recent Labs Lab 06/04/15 1823 06/04/15 2002 06/05/15 0034 06/05/15 0213 06/05/15 0430 06/05/15 0746  GLUCAP 194* 202* 213* 221* 217* 212*    CXR: white out of L  side with cutoff sign of L main bronchus   ASSESSMENT / PLAN:  PULMONARY A: Acute hypoxic respiratory failure Pulmonary edema B pleural effusions Likely atelectasis of L lung P:   Cont to monitor in SDU Cont PRN BiPAP for respiratory distress or refractory hypoxemia Cont supplemental O2 to maintain SpO2 > 92% Change back to nebulized steroids and bronchodilators  CARDIOVASCULAR A:  Elevated cardiac markers - peak troponin 1.85 (05/11)  Not a candidate for aggressive eval or intervention S/P bradycardic PEA X 2 arrest 05/13 Severe ischemic cardiomyopathy History of hypertension PAF with RVR (now sinus with LBBB) Severe PVD with L foot ischemia P:  Cont PO amiodarone Monitor BP and rhythm  RENAL A:   AKI, resolved Likely some component of CKD Hypervolemia - improving Loop diuretic induced metabolic acidosis - resolved P:   Monitor BMET intermittently Monitor I/Os Correct electrolytes as indicated  GASTROINTESTINAL A:   No acute issues P:   SUP not indicated post extubation Begin diet  HEMATOLOGIC A:   Anemia without overt bleeding P:  DVT px: LMWH Monitor CBC intermittently Transfuse per usual guidelines  One unit PRBCs 05/17 for Hgb 7.0  INFECTIOUS A:   LLE wound infection P:   Monitor temp, WBC count Micro and abx as above  ENDOCRINE A:   DM 2, adequately controlled P:   Cont ACHS SSI   NEUROLOGIC A:   Acute encephalopathy, resolved  Chronic pain, controlled Agitation, controlled P:   RASS goal: 0 Cont PRN fentanyl Cont low dose PRN alprazolam initiated 05/17   Billy Fischeravid Mylasia Vorhees, MD PCCM service Mobile [phone] Pager [phone]    06/05/2015, 11:28 AM

## 2015-06-05 NOTE — Progress Notes (Signed)
Palliative Care Update  Notes reviewed.  I am following 'at a distance' today.  I plan to speak to Critical Care physician and with Social Worker tomorrow to get updated as to plans that may be underway.  Then, I will become as involved as may be appropriate.  Johnny HalterMargaret F Tevin Shillingford, MD

## 2015-06-05 NOTE — Progress Notes (Signed)
eLink Physician-Brief Progress Note Patient Name: Johnny RastDavid Lane DOB: 10/25/1950 MRN: [id number]   Date of Service  06/05/2015  HPI/Events of Note  CXR shows LEft sided opacification, effusion vs complete atalectasis, favor effusion  eICU Interventions  Cont with bipap,will increase ipap/epap to obtain maximum TV, start BD therapy     Intervention Category Major Interventions: Respiratory failure - evaluation and management  Tammala Weider 06/05/2015, 2:31 AM

## 2015-06-05 NOTE — Progress Notes (Signed)
eLink Physician-Brief Progress Note Patient Name: Johnny RastDavid Lane DOB: 07/08/1950 MRN: [id number]   Date of Service  06/05/2015  HPI/Events of Note  Camera check 455 AM  eICU Interventions  Less WOB, still SOB. continue bipAP      Intervention Category Major Interventions: Respiratory failure - evaluation and management  [name] 06/05/2015, 4:53 AM

## 2015-06-05 NOTE — Progress Notes (Signed)
eLink Physician-Brief Progress Note Patient Name: Johnny RastDavid Lane DOB: 01/15/1951 MRN: [id number]   Date of Service  06/05/2015  HPI/Events of Note  Camera check 315AM-on bipap, WOB has improved, given lasix and morphine  eICU Interventions  Await ABG to adjust settings.      Intervention Category Major Interventions: Respiratory failure - evaluation and management  Veyda Kaufman 06/05/2015, 3:13 AM

## 2015-06-05 NOTE — Progress Notes (Signed)
Pt not stable to undergo any invasive procedure.  Vascular to consider revascularization maybe next week.  Will continue with local wound care at this time. Will follow loosely for now.  Please call for any urgent concern to foot.

## 2015-06-06 ENCOUNTER — Encounter: Admission: EM | Disposition: E | Payer: Self-pay | Source: Home / Self Care | Attending: Internal Medicine

## 2015-06-06 ENCOUNTER — Inpatient Hospital Stay: Payer: Medicare Other

## 2015-06-06 DIAGNOSIS — J9819 Other pulmonary collapse: Secondary | ICD-10-CM

## 2015-06-06 DIAGNOSIS — J9622 Acute and chronic respiratory failure with hypercapnia: Secondary | ICD-10-CM

## 2015-06-06 DIAGNOSIS — J9621 Acute and chronic respiratory failure with hypoxia: Secondary | ICD-10-CM

## 2015-06-06 LAB — GLUCOSE, CAPILLARY
GLUCOSE-CAPILLARY: 169 mg/dL — AB (ref 65–99)
GLUCOSE-CAPILLARY: 173 mg/dL — AB (ref 65–99)
GLUCOSE-CAPILLARY: 129 mg/dL — AB (ref 65–99)
GLUCOSE-CAPILLARY: 119 mg/dL — AB (ref 65–99)
Glucose-Capillary: 102 mg/dL — ABNORMAL HIGH (ref 65–99)
Glucose-Capillary: 148 mg/dL — ABNORMAL HIGH (ref 65–99)
Glucose-Capillary: 150 mg/dL — ABNORMAL HIGH (ref 65–99)

## 2015-06-06 LAB — COMPREHENSIVE METABOLIC PANEL
ALBUMIN: 2.4 g/dL — AB (ref 3.5–5.0)
ALK PHOS: 64 U/L (ref 38–126)
ALT: 77 U/L — ABNORMAL HIGH (ref 17–63)
ANION GAP: 7 (ref 5–15)
AST: 22 U/L (ref 15–41)
BUN: 35 mg/dL — ABNORMAL HIGH (ref 6–20)
CHLORIDE: 106 mmol/L (ref 101–111)
CO2: 31 mmol/L (ref 22–32)
Calcium: 8.3 mg/dL — ABNORMAL LOW (ref 8.9–10.3)
Creatinine, Ser: 1.14 mg/dL (ref 0.61–1.24)
GFR calc non Af Amer: >60 mL/min (ref >60)
GLUCOSE: 165 mg/dL — AB (ref 65–99)
POTASSIUM: 3.6 mmol/L (ref 3.5–5.1)
SODIUM: 144 mmol/L (ref 135–145)
Total Bilirubin: 1 mg/dL (ref 0.3–1.2)
Total Protein: 6.7 g/dL (ref 6.5–8.1)

## 2015-06-06 LAB — CBC
HEMATOCRIT: 28.7 % — AB (ref 40.0–52.0)
HEMOGLOBIN: 9.2 g/dL — AB (ref 13.0–18.0)
MCH: 28 pg (ref 26.0–34.0)
MCHC: 32 g/dL (ref 32.0–36.0)
MCV: 87.6 fL (ref 80.0–100.0)
Platelets: 342 10*3/uL (ref 150–440)
RBC: 3.28 MIL/uL — AB (ref 4.40–5.90)
RDW: 16.1 % — ABNORMAL HIGH (ref 11.5–14.5)
WBC: 10.7 10*3/uL — ABNORMAL HIGH (ref 3.8–10.6)

## 2015-06-06 LAB — TRIGLYCERIDES: TRIGLYCERIDES: 127 mg/dL (ref <150)

## 2015-06-06 SURGERY — IRRIGATION AND DEBRIDEMENT FOOT
Anesthesia: Choice | Laterality: Left

## 2015-06-06 MED ORDER — ROCURONIUM BROMIDE 50 MG/5ML IV SOLN
INTRAVENOUS | Status: AC
  Administered 2015-06-06: 09:00:00
  Filled 2015-06-06: qty 1

## 2015-06-06 MED ORDER — FENTANYL BOLUS VIA INFUSION
25.0000 ug | INTRAVENOUS | Status: DC | PRN
  Filled 2015-06-06: qty 25

## 2015-06-06 MED ORDER — CLOPIDOGREL BISULFATE 75 MG PO TABS
75.0000 mg | ORAL_TABLET | Freq: Every day | ORAL | Status: DC
  Administered 2015-06-06 – 2015-06-07 (×2): 75 mg
  Filled 2015-06-06 (×2): qty 1

## 2015-06-06 MED ORDER — FENTANYL 2500MCG IN NS 250ML (10MCG/ML) PREMIX INFUSION
INTRAVENOUS | Status: AC
  Administered 2015-06-06: 09:00:00
  Filled 2015-06-06: qty 250

## 2015-06-06 MED ORDER — MIDAZOLAM HCL 2 MG/2ML IJ SOLN: 1.0000 mg | INTRAMUSCULAR | Status: DC | PRN

## 2015-06-06 MED ORDER — AMIODARONE HCL 200 MG PO TABS
400.0000 mg | ORAL_TABLET | Freq: Two times a day (BID) | ORAL | Status: DC
  Administered 2015-06-06 – 2015-06-07 (×3): 400 mg
  Filled 2015-06-06 (×3): qty 2

## 2015-06-06 MED ORDER — FAMOTIDINE 40 MG/5ML PO SUSR
20.0000 mg | Freq: Two times a day (BID) | ORAL | Status: DC
  Administered 2015-06-06 – 2015-06-07 (×3): 20 mg
  Filled 2015-06-06 (×4): qty 2.5

## 2015-06-06 MED ORDER — BUDESONIDE 0.25 MG/2ML IN SUSP
0.2500 mg | Freq: Four times a day (QID) | RESPIRATORY_TRACT | Status: DC
  Administered 2015-06-06 – 2015-06-08 (×9): 0.25 mg via RESPIRATORY_TRACT
  Filled 2015-06-06 (×8): qty 2

## 2015-06-06 MED ORDER — ROCURONIUM BROMIDE 50 MG/5ML IV SOLN
50.0000 mg | Freq: Once | INTRAVENOUS | Status: AC
  Administered 2015-06-06: 50 mg via INTRAVENOUS

## 2015-06-06 MED ORDER — ETOMIDATE 2 MG/ML IV SOLN
20.0000 mg | Freq: Once | INTRAVENOUS | Status: AC
  Administered 2015-06-06: 20 mg via INTRAVENOUS
  Filled 2015-06-06: qty 10

## 2015-06-06 MED ORDER — VITAL HIGH PROTEIN PO LIQD
1000.0000 mL | ORAL | Status: DC
  Administered 2015-06-06: 1000 mL
  Administered 2015-06-07: 05:00:00

## 2015-06-06 MED ORDER — PROPOFOL 1000 MG/100ML IV EMUL
0.0000 ug/kg/min | INTRAVENOUS | Status: DC
  Administered 2015-06-06: 10 ug/kg/min via INTRAVENOUS
  Administered 2015-06-06: 25 ug/kg/min via INTRAVENOUS
  Administered 2015-06-06: 30 ug/kg/min via INTRAVENOUS
  Administered 2015-06-07 (×2): 20 ug/kg/min via INTRAVENOUS
  Filled 2015-06-06 (×4): qty 100

## 2015-06-06 MED ORDER — FENTANYL 2500MCG IN NS 250ML (10MCG/ML) PREMIX INFUSION
25.0000 ug/h | INTRAVENOUS | Status: DC
  Administered 2015-06-06: 50 ug/h via INTRAVENOUS
  Administered 2015-06-06: 100 ug/h via INTRAVENOUS
  Filled 2015-06-06: qty 250

## 2015-06-06 MED ORDER — INSULIN ASPART 100 UNIT/ML ~~LOC~~ SOLN
0.0000 [IU] | SUBCUTANEOUS | Status: DC
  Administered 2015-06-06 (×2): 3 [IU] via SUBCUTANEOUS
  Administered 2015-06-06 – 2015-06-07 (×2): 2 [IU] via SUBCUTANEOUS
  Filled 2015-06-06: qty 3
  Filled 2015-06-06: qty 2
  Filled 2015-06-06: qty 3
  Filled 2015-06-06: qty 2

## 2015-06-06 MED ORDER — FENTANYL CITRATE (PF) 100 MCG/2ML IJ SOLN
50.0000 ug | Freq: Once | INTRAMUSCULAR | Status: DC
Start: 2015-06-06 — End: 2015-06-07

## 2015-06-06 NOTE — Progress Notes (Signed)
[facility]  Daily Progress Note   Subjective  - 5 Days Post-Op  Agitation and confusion overnight Still requiring Bipap No fever or signs of sepsis currently  Objective Filed Vitals:   06/04/2015 0500 05/19/2015 0530 06/12/2015 0600 05/24/2015 0630  BP: 145/72 146/74 137/67 154/70  Pulse: 70 69 72 72  Temp: 97.6 F (36.4 C)     TempSrc: Oral     Resp: 21 25 17 22   Height:      Weight:      SpO2: 94% 92% 91% 94%    Intake/Output Summary (Last 24 hours) at 06/13/2015 0812 Last data filed at 06/16/2015 0600  Gross per 24 hour  Intake    668 ml  Output   1550 ml  Net   -882 ml    PULM  CTAB CV  RRR VASC  No pedal pulses, wounds stable on both feet  Laboratory CBC    Component Value Date/Time   WBC 10.7* 06/17/2015 0337   WBC 8.8 12/25/2013 1136   HGB 9.2* 06/16/2015 0337   HGB 14.2 12/25/2013 1136   HCT 28.7* 05/26/2015 0337   HCT 43.7 12/25/2013 1136   PLT 342 05/26/2015 0337   PLT 210 12/25/2013 1136    BMET    Component Value Date/Time   NA 144 06/05/2015 0337   NA 140 12/25/2013 1136   K 3.6 06/13/2015 0337   K 3.9 12/25/2013 1136   CL 106 06/02/2015 0337   CL 107 12/25/2013 1136   CO2 31 06/07/2015 0337   CO2 30 12/25/2013 1136   GLUCOSE 165* 06/13/2015 0337   GLUCOSE 269* 12/25/2013 1136   BUN 35* 06/07/2015 0337   BUN 21* 12/25/2013 1136   CREATININE 1.14 06/02/2015 0337   CREATININE 0.71 12/25/2013 1136   CALCIUM 8.3* 06/18/2015 0337   CALCIUM 8.4* 12/25/2013 1136   GFRNONAA >60 06/07/2015 0337   GFRNONAA >60 12/25/2013 1136   GFRAA >60 06/07/2015 0337   GFRAA >60 12/25/2013 1136    Assessment/Planning: PAD with ulcerations BLE, still not able to do angiogram due to medical issues   Severe cardiomyopathy s/p PEA arrest within a week.  More stable on anti-arrhythmics.  At high risk for further arrhythmias  Respiratory status remains tenuous.  On bipap and had more agitation last night with confusion and  psychosis.  Will plan angiograms next week when critical care/medical service feel patient is stable.    Johnny Lane  06/18/2015, 8:12 AM

## 2015-06-06 NOTE — Progress Notes (Signed)
Pt.'s vent check was not completed due to picc line being inserted.

## 2015-06-06 NOTE — Care Management (Signed)
Patient re intubated. Case discussed with Dr. Sung AmabileSimonds. He does not want to pursue LTACH at this time. He stated he could reevaluate LTACH once patient is extubated.

## 2015-06-06 NOTE — Progress Notes (Signed)
Patient intubated due to unable to tolerate Bipap, Dr Bard HerbertSimmonds at bedside during intubation, OG tube placed as well. VS wnl as charted. Fentanyl drip infusing.

## 2015-06-06 NOTE — Progress Notes (Signed)
Nutrition Follow-up  DOCUMENTATION CODES:   Not applicable  INTERVENTION:  -Received Dietitian Consult for TF via Adult Tube Feeding Protocol ordered by MD. Continue TF as ordered at present; will reassess goal rate on follow-up taking into consideration calories from diprivan.  -Per flowsheet, pt without BM since 05/30/15 (7 days). Recommend further intervention regarding bowel regimen   NUTRITION DIAGNOSIS:   Inadequate oral intake related to inability to eat as evidenced by NPO status.  Being addressed via TF  GOAL:   Patient will meet greater than or equal to 90% of their needs  MONITOR:   PO intake, Supplement acceptance, Labs, Weight trends  REASON FOR ASSESSMENT:   Ventilator    ASSESSMENT:   Pt admitted with urosepsis as well as chronic lower extremity diabetic foot ulcers. Podiatry consult placed; per MD note likely surgery tomorrow for I&D and possible amputations.  Pt with decline in respiratory status this AM, left lung collapse, requiring intubation, s/p bronch this AM as well with copious thick secretions removed. Currently sedated on fentanyl, versed, diprivan added this AM  OG tube placed, Adult Tube Feeding Protocol entered by MD this AM  Diet Order:  DIET SOFT Room service appropriate?: Yes; Fluid consistency:: Thin   Energy Intake: no recorded po intake of meals between intubations  Skin:   (Multiple Stage III and Unstageable pressure uclers of foot)  Last BM:  05/30/15   Labs: reviewed  Meds: ss novolog, fentanyl, versed, diprivan  Height:   Ht Readings from Last 1 Encounters:  04-16-15 5\' 10"  (1.778 m)    Weight:   Wt Readings from Last 1 Encounters:  06/04/15 187 lb 9.8 oz (85.1 kg)   BMI:  Body mass index is 26.92 kg/(m^2).  Estimated Nutritional Needs:   Kcal:  2125-2550 kcals  Protein:  >/= 128 g  Fluid:  >2L fluid  EDUCATION NEEDS:   No education needs identified at this time  Romelle StarcherCate Nysha Koplin MS, RD, LDN [phone]  Pager  [phone] Weekend/On-Call Pager

## 2015-06-06 NOTE — Procedures (Signed)
Bronchoscopy Procedure Note Johnny Lane [id number] 11/27/1950  Procedure: Bronchoscopy Indications: Remove secretions  Procedure Details Consent: Risks of procedure as well as the alternatives and risks of each were explained to the (patient/caregiver).  Consent for procedure obtained. Time Out: Verified patient identification, verified procedure, site/side was marked, verified correct patient position, special equipment/implants available, medications/allergies/relevent history reviewed, required imaging and test results available.  Performed  In preparation for procedure, bronchoscope lubricated. Sedation: fentanyl infusion  Airway entered and the following bronchi were examined: RUL, RML, RLL, LUL, LLL and Bronchi.   Findings: thick mucopurulent secretions plugging L main bronchus - successfully removed Bronchoscope removed.    Evaluation Hemodynamic Status: BP stable throughout; O2 sats: stable throughout Patient's Current Condition: stable Specimens: washings from L main bronchus Complications: No apparent complications Patient did tolerate procedure well.   Johnny Lane 06/04/2015

## 2015-06-06 NOTE — Progress Notes (Signed)
Clinical Social Worker consulted for possible placement. CSW reviewed chart. Currently pt is again intubated and unable to assess patient. In consultation with ICU nurse Isabelle CourseLydia he does have POA and sister who knows that he is in ICU. LCSW will follow over weekend.  Delta Air LinesClaudine Venna Berberich LCSW [phone]

## 2015-06-06 NOTE — Progress Notes (Signed)
Palliative Care  Pt had stated that he desired to be re-intubated if needed, and this took place.  Palliative Care consult order has been DCd.  Please consult again if needed.  Suan HalterMargaret F Ladonne Sharples, MD

## 2015-06-06 NOTE — Procedures (Signed)
Oral Intubation Procedure Note  Indications: Respiratory insufficiency, collapse L lung Consent: Discussed with pt Time Out: Verified patient identification, verified procedure, site/side was marked, verified correct patient position, special equipment/implants available, medications/allergies/relevent history reviewed, required imaging and test results available.   Pre-meds: Etomidate 20 mg IV  Neuromuscular blockade: Rocuronium 50 mg IV  Laryngoscope: #4 MAC  Visualization: cords fully visualized  ETT: 8.0 ETT passed on first attempt and secured @ 22 cm at upper gums  Findings: normal upper airway   Evaluation:  Tube position confirmed by auscultation and EZCap CXR - ETT too high and advanced 3 cm  Pt tolerated procedure well without complications   Billy Fischeravid Simonds, MD PCCM service Mobile [phone] Pager [phone] 06/17/2015

## 2015-06-06 NOTE — Progress Notes (Signed)
Vascular wellness at bedside performing PICC placement, pt's sister also at bedside during procedure.  Patient continues on the ventilator, 500, 15, 5 and 505 fio2. POA will be here in a.m to speak with MD regarding pt's goals and care. Fentanyl continues at 35100mcg/hr, Propofol at 6125mcg/kg/hr. Patient is resting comfortably. Tube feeding started and is tolerating well. VS wnl. Will monitor closely.

## 2015-06-06 NOTE — Progress Notes (Signed)
PULMONARY / CRITICAL CARE MEDICINE   Name: Johnny Lane MRN: [id number] DOB: [birth year]    ADMISSION DATE:  06/10/2015   CONSULTATION DATE:  05/31/2015  REFERRING MD:  Hospitalists  PT PROFILE:  86 M with severe cardiomyopathy, DM2, PVD, chronic venous stasis admitted 5/11 with dx of cellulitis, sepsis. Initial cardiac markers were mildly elevated. On morning of 5/13 found pulseless/PEA arrest. Intubated and transferred to ICU. PCCM assumed care. Extubated 05/16 and cognition intact.  MAJOR EVENTS/TEST RESULTS: 05/11 admitted as above. Cardiac markers elevated (trop I 1.85) 05/12 TTE: LVEF 25-30% 05/12 MRI L foot: Large skin ulcerations and areas of debridement on the left foot. Negative for osteomyelitis or septic joint 05/12 LE venous US: Sonographic survey bilateral lower extremities negative for DVT 05/13 PEA arrest - prolonged resuscitation. Intubated 05/16 Extubated to [place] O2. AFRVR developed in evening - amiodarone infusion initiated 05/17 Remained dyspneic. CXR still c/w volume overload. Hgb 7.0. One unit PRBCs ordered. Changed to SDU status 05/18 Progressive respiratory distress and hypoxemia. CXR with compete opacification of L side. BiPAP initiated with improvement. Code status discussed with pt > he would undergo re-intubation but short term only. No trach tube. He would undergo ACLS again but nothing prolonged 05/19 L lung collapse persists. Intubated and bronchoscopy performed. Copious thick secretions removed from L main bronchus  INDWELLING DEVICES:: ETT 05/13 >> 05/16, 05/19 >>  R IJ CVl 05/13 >> 05/16 PICC 05/19 (ordered) >>   MICRO DATA: All micro reviewed L foot wound 05/12 >> moderate growth Groups G and C strep, light growth enterobacter Resp (bronch) 05/19 >>   ANTIMICROBIALS:  Vanc 05/11 >> 05/15 Pip-tazo 05/11 >> 05/15 Cefazolin 05/15 >> 05/16 Ceftriaxone 05/16 >>    SUBJECTIVE:  RASS 0, + F/C. Oriented X 3. Remains on BiPAP  VITAL SIGNS: BP 87/54  mmHg  Pulse 64  Temp(Src) 98.9 F (37.2 C) (Oral)  Resp 15  Ht  (1.778 m)  Wt 85.1 kg (187 lb 9.8 oz)  BMI 26.92 kg/m2  SpO2 96%  HEMODYNAMICS:    VENTILATOR SETTINGS: Vent Mode:  [-] PRVC FiO2 (%):  [60 %-100 %] 60 % Set Rate:  [15 bmp] 15 bmp Vt Set:  [500 mL] 500 mL PEEP:  [5 cmH20] 5 cmH20  INTAKE / OUTPUT: I/O last 3 completed shifts: In: 668 [P.O.:240; I.V.:3; Other:375; IV Piggyback:50] Out: 3300 [Urine:3300]  PHYSICAL EXAMINATION: NAD HEENT WNL BS absent on L, no wheezes Reg, no M NABS, soft Severe chronic stasis changes L foot in surgical dressing No focal neuro deficits  LABS:  BMET  Recent Labs Lab 06/04/15 0500 06/05/15 0922 05/26/2015 0337  NA 143 144 144  K 3.9 3.9 3.6  CL 104 105 106  CO2 33* 31 31  BUN 41* 43* 35*  CREATININE 1.19 1.22 1.14  GLUCOSE 196* 231* 165*    Electrolytes  Recent Labs Lab 05/31/15 0417 06/02/2015 0420 06/02/15 0402  06/04/15 0500 06/05/15 0922 06/13/2015 0337  CALCIUM 8.3* 7.9* 7.4*  < > 7.5* 8.0* 8.3*  MG 2.4 2.5* 2.3  --   --   --   --   PHOS 5.3* 2.5 2.2*  --   --   --   --   < > = values in this interval not displayed.  CBC  Recent Labs Lab 06/04/15 0500 06/05/15 0922 06/18/2015 0337  WBC 11.8* 11.5* 10.7*  HGB 7.0* 8.8* 9.2*  HCT 22.3* 27.2* 28.7*  PLT 190 277 342    Coag's  Recent  Labs Lab 05/31/15 0417  INR 1.06    Sepsis Markers  Recent Labs Lab 05/31/15 0417 06/03/15 0401 06/04/15 0500  LATICACIDVEN 3.5*  --   --   PROCALCITON 8.20 1.90 1.12    ABG  Recent Labs Lab 05/25/2015 0436 06/02/15 0500 06/05/15 0303  PHART 7.49* 7.56* 7.36  PCO2ART 45 39 63*  PO2ART 114* 74* 55*    Liver Enzymes  Recent Labs Lab 06/02/15 0402 12-Apr-2015 0337  AST 742* 22  ALT 521* 77*  ALKPHOS 57 64  BILITOT 1.3* 1.0  ALBUMIN 1.8* 2.4*    Cardiac Enzymes  Recent Labs Lab 05/31/15 0417 05/31/15 0812 05/31/15 1556  TROPONINI 0.66* 1.04* 1.56*    Glucose  Recent  Labs Lab 06/05/15 1618 06/05/15 2021 12-Apr-2015 0001 12-Apr-2015 0402 12-Apr-2015 0750 12-Apr-2015 1132  GLUCAP 154* 128* 150* 148* 169* 173*    CXR: white out of L side with cutoff sign of L main bronchus   ASSESSMENT / PLAN:  PULMONARY A: Acute hypoxic respiratory failure Pulmonary edema B pleural effusions Likely atelectasis of L lung P:   Vent settings established Vent bundle implemented Daily SBT as indicated Change to ICU status  CARDIOVASCULAR A:  Elevated cardiac markers - peak troponin 1.85 (05/11)  Not a candidate for aggressive eval or intervention S/P bradycardic PEA X 2 arrest 05/13 Severe ischemic cardiomyopathy History of hypertension PAF with RVR (now sinus with LBBB) Severe PVD with L foot ischemia P:  Cont enteral amiodarone Monitor BP and rhythm  RENAL A:   AKI, resolved Likely some component of CKD Hypervolemia - resolved Loop diuretic induced metabolic acidosis - resolved P:   Monitor BMET intermittently Monitor I/Os Correct electrolytes as indicated  GASTROINTESTINAL A:   No acute issues P:   SUP: enteral famotidine TFs ordered 05/19  HEMATOLOGIC A:   Anemia without overt bleeding - improving P:  DVT px: LMWH Monitor CBC intermittently Transfuse per usual guidelines  One unit PRBCs 05/17 for Hgb 7.0  INFECTIOUS A:   LLE wound infection P:   Monitor temp, WBC count Micro and abx as above Podiatry and vasc surgery following   ENDOCRINE A:   DM 2, adequately controlled P:   Change SSI to q 4 hrs while intubated  NEUROLOGIC A:   Acute encephalopathy, resolved  Chronic pain, controlled Agitation, controlled P:   RASS goal: 0, -1 PAD protocol Propofol and fent gtts  Sister updated @ bedside  CCM time: 45 mins The above time includes time spent in consultation with patient and/or family members and reviewing care plan on multidisciplinary rounds  Billy Fischeravid Maniya Donovan, MD PCCM service Mobile [phone] Pager  [phone]  2015/02/25, 12:40 PM

## 2015-06-07 ENCOUNTER — Inpatient Hospital Stay: Payer: Medicare Other

## 2015-06-07 LAB — GLUCOSE, CAPILLARY
Glucose-Capillary: 145 mg/dL — ABNORMAL HIGH (ref 65–99)
Glucose-Capillary: 146 mg/dL — ABNORMAL HIGH (ref 65–99)
Glucose-Capillary: 220 mg/dL — ABNORMAL HIGH (ref 65–99)
Glucose-Capillary: 238 mg/dL — ABNORMAL HIGH (ref 65–99)
Glucose-Capillary: 186 mg/dL — ABNORMAL HIGH (ref 65–99)
Glucose-Capillary: 160 mg/dL — ABNORMAL HIGH (ref 65–99)

## 2015-06-07 LAB — BASIC METABOLIC PANEL
Anion gap: 3 — ABNORMAL LOW (ref 5–15)
BUN: 33 mg/dL — AB (ref 6–20)
CHLORIDE: 109 mmol/L (ref 101–111)
CO2: 33 mmol/L — ABNORMAL HIGH (ref 22–32)
CREATININE: 0.99 mg/dL (ref 0.61–1.24)
Calcium: 8 mg/dL — ABNORMAL LOW (ref 8.9–10.3)
Glucose, Bld: 153 mg/dL — ABNORMAL HIGH (ref 65–99)
POTASSIUM: 3.1 mmol/L — AB (ref 3.5–5.1)
SODIUM: 145 mmol/L (ref 135–145)

## 2015-06-07 LAB — CBC
HEMATOCRIT: 25.9 % — AB (ref 40.0–52.0)
Hemoglobin: 8.3 g/dL — ABNORMAL LOW (ref 13.0–18.0)
MCH: 27.7 pg (ref 26.0–34.0)
MCHC: 32 g/dL (ref 32.0–36.0)
MCV: 86.5 fL (ref 80.0–100.0)
PLATELETS: 298 10*3/uL (ref 150–440)
RBC: 3 MIL/uL — AB (ref 4.40–5.90)
RDW: 16.4 % — AB (ref 11.5–14.5)
WBC: 14.1 10*3/uL — AB (ref 3.8–10.6)

## 2015-06-07 MED ORDER — CLOPIDOGREL BISULFATE 75 MG PO TABS
75.0000 mg | ORAL_TABLET | Freq: Every day | ORAL | Status: DC
  Administered 2015-06-08: 75 mg via ORAL
  Filled 2015-06-07: qty 1

## 2015-06-07 MED ORDER — FUROSEMIDE 20 MG PO TABS
20.0000 mg | ORAL_TABLET | Freq: Every day | ORAL | Status: DC
  Administered 2015-06-07 – 2015-06-08 (×2): 20 mg via ORAL
  Filled 2015-06-07 (×2): qty 1

## 2015-06-07 MED ORDER — ENSURE ENLIVE PO LIQD
237.0000 mL | Freq: Three times a day (TID) | ORAL | Status: DC
  Administered 2015-06-07 – 2015-06-08 (×4): 237 mL via ORAL

## 2015-06-07 MED ORDER — CARVEDILOL 3.125 MG PO TABS
3.1250 mg | ORAL_TABLET | Freq: Two times a day (BID) | ORAL | Status: DC
  Administered 2015-06-07 – 2015-06-08 (×2): 3.125 mg via ORAL
  Filled 2015-06-07 (×2): qty 1

## 2015-06-07 MED ORDER — MORPHINE SULFATE (PF) 2 MG/ML IV SOLN
2.0000 mg | INTRAVENOUS | Status: DC | PRN
  Administered 2015-06-08 (×2): 2 mg via INTRAVENOUS
  Filled 2015-06-07 (×3): qty 1

## 2015-06-07 MED ORDER — INSULIN ASPART 100 UNIT/ML ~~LOC~~ SOLN: 0.0000 [IU] | Freq: Every day | SUBCUTANEOUS | Status: DC

## 2015-06-07 MED ORDER — POTASSIUM CHLORIDE 10 MEQ/100ML IV SOLN
10.0000 meq | INTRAVENOUS | Status: AC
  Administered 2015-06-07 (×4): 10 meq via INTRAVENOUS
  Filled 2015-06-07 (×4): qty 100

## 2015-06-07 MED ORDER — INSULIN ASPART 100 UNIT/ML ~~LOC~~ SOLN
0.0000 [IU] | Freq: Three times a day (TID) | SUBCUTANEOUS | Status: DC
  Administered 2015-06-07 – 2015-06-08 (×3): 5 [IU] via SUBCUTANEOUS
  Administered 2015-06-08: 3 [IU] via SUBCUTANEOUS
  Filled 2015-06-07: qty 3
  Filled 2015-06-07 (×3): qty 5

## 2015-06-07 MED ORDER — SENNOSIDES 8.8 MG/5ML PO SYRP
5.0000 mL | ORAL_SOLUTION | Freq: Two times a day (BID) | ORAL | Status: DC | PRN
  Filled 2015-06-07: qty 5

## 2015-06-07 MED ORDER — AMIODARONE HCL 200 MG PO TABS
400.0000 mg | ORAL_TABLET | Freq: Two times a day (BID) | ORAL | Status: DC
  Administered 2015-06-07 – 2015-06-08 (×2): 400 mg via ORAL
  Filled 2015-06-07 (×2): qty 2

## 2015-06-07 NOTE — Progress Notes (Signed)
PULMONARY / CRITICAL CARE MEDICINE   Name: Johnny Lane MRN: [id number] DOB: 1950-03-11    ADMISSION DATE:  06/17/2015   CONSULTATION DATE:  05/31/2015  REFERRING MD:  Hospitalists  PT PROFILE:  56 M with severe cardiomyopathy, DM2, PVD, chronic venous stasis admitted 5/11 with dx of cellulitis, sepsis. Initial cardiac markers were mildly elevated. On morning of 5/13 found pulseless/PEA arrest. Intubated and transferred to ICU. PCCM assumed care. Extubated 05/16 and cognition intact.  MAJOR EVENTS/TEST RESULTS: 05/11 admitted as above. Cardiac markers elevated (trop I 1.85) 05/12 TTE: LVEF 25-30% 05/12 MRI L foot: Large skin ulcerations and areas of debridement on the left foot. Negative for osteomyelitis or septic joint 05/12 LE venous US: Sonographic survey bilateral lower extremities negative for DVT 05/13 PEA arrest - prolonged resuscitation. Intubated 05/16 Extubated to [place] O2. AFRVR developed in evening - amiodarone infusion initiated 05/17 Remained dyspneic. CXR still c/w volume overload. Hgb 7.0. One unit PRBCs ordered. Changed to SDU status 05/18 Progressive respiratory distress and hypoxemia. CXR with compete opacification of L side. BiPAP initiated with improvement. Code status discussed with pt > he would undergo re-intubation but short term only. No trach tube. He would undergo ACLS again but nothing prolonged 05/19 L lung collapse persists. Intubated and bronchoscopy performed. Copious thick secretions removed from L main bronchus 05/20: Self-extubated. Improved aeration in L lung on CXR. Tolerating [place] O2. Made DNR per pt's request. Changed to SDU status  INDWELLING DEVICES:: ETT 05/13 >> 05/16, 05/19 >> 05/20 R IJ CVl 05/13 >> 05/16 PICC 05/19 >>   MICRO DATA: All micro reviewed L foot wound 05/12 >> moderate growth Groups G and C strep, light growth enterobacter Resp (bronch) 05/19 >> Heavy growth GNR >>   ANTIMICROBIALS:  Vanc 05/11 >> 05/15 Pip-tazo 05/11 >>  05/15 Cefazolin 05/15 >> 05/16 Ceftriaxone 05/16 >>    SUBJECTIVE:  RASS 0, + F/C. Oriented X 3. Self-extubated. Now on non-rebreather mask and saturating at 100%. Patient has a very weak cough. He is fully alert and state that he doesn't want the ETT anymore.   VITAL SIGNS: BP 155/63 mmHg  Pulse 69  Temp(Src) 97.8 F (36.6 C) (Oral)  Resp 21  Ht  (1.778 m)  Wt 186 lb 8.2 oz (84.6 kg)  BMI 26.76 kg/m2  SpO2 100%  HEMODYNAMICS:    VENTILATOR SETTINGS: Vent Mode:  [-] PRVC FiO2 (%):  [40 %-100 %] 40 % Set Rate:  [15 bmp] 15 bmp Vt Set:  [500 mL] 500 mL PEEP:  [5 cmH20] 5 cmH20 Plateau Pressure:  [18 cmH20] 18 cmH20  INTAKE / OUTPUT: I/O last 3 completed shifts: In: 2005.2 [P.O.:240; I.V.:596.2; Other:375; NG/GT:744; IV Piggyback:50] Out: 2000 [Urine:2000]  PHYSICAL EXAMINATION: NAD HEENT WNL, ETT BS bilateral, diminished on the left and in teh bases, no wheezes Reg, no M NABS, soft Severe chronic stasis changes L foot in surgical dressing No focal neuro deficits  LABS:  BMET  Recent Labs Lab 06/05/15 0922 2015/06/19 0337 06/07/15 0425  NA 144 144 145  K 3.9 3.6 3.1*  CL 105 106 109  CO2 31 31 33*  BUN 43* 35* 33*  CREATININE 1.22 1.14 0.99  GLUCOSE 231* 165* 153*    Electrolytes  Recent Labs Lab 05/23/2015 0420 06/02/15 0402  06/05/15 0922 06/19/15 0337 06/07/15 0425  CALCIUM 7.9* 7.4*  < > 8.0* 8.3* 8.0*  MG 2.5* 2.3  --   --   --   --   PHOS 2.5 2.2*  --   --   --   --   < > =  values in this interval not displayed.  CBC  Recent Labs Lab 06/05/15 0922 06/16/2015 0337 06/07/15 0425  WBC 11.5* 10.7* 14.1*  HGB 8.8* 9.2* 8.3*  HCT 27.2* 28.7* 25.9*  PLT 277 342 298    Coag's No results for input(s): APTT, INR in the last 168 hours.  Sepsis Markers  Recent Labs Lab 06/03/15 0401 06/04/15 0500  PROCALCITON 1.90 1.12    ABG  Recent Labs Lab 05/23/2015 0436 06/02/15 0500 06/05/15 0303  PHART 7.49* 7.56* 7.36  PCO2ART  45 39 63*  PO2ART 114* 74* 55*    Liver Enzymes  Recent Labs Lab 06/02/15 0402 05/28/2015 0337  AST 742* 22  ALT 521* 77*  ALKPHOS 57 64  BILITOT 1.3* 1.0  ALBUMIN 1.8* 2.4*    Cardiac Enzymes  Recent Labs Lab 05/31/15 0812 05/31/15 1556  TROPONINI 1.04* 1.56*    Glucose  Recent Labs Lab 06/02/2015 0750 05/25/2015 1132 05/30/2015 1610 06/07/2015 1933 06/11/2015 2329 06/07/15 0357  GLUCAP 169* 173* 129* 119* 102* 145*    Dg Abd 1 View  05/24/2015  CLINICAL DATA:  NG tube placement EXAM: ABDOMEN - 1 VIEW COMPARISON:  05/31/2015 FINDINGS: NG tube tip remains in the stomach. The stomach is markedly distended. No obvious free intraperitoneal gas. Bilateral pleural effusions. No disproportionate dilatation of small or large bowel. IMPRESSION: NG tube remains in the stomach. The stomach is markedly distended with gas. Electronically Signed   By: Jolaine Click M.D.   On: 06/07/2015 09:27   Dg Chest Port 1 View  05/30/2015  CLINICAL DATA:  PICC line placement, hypertension, diabetes mellitus, former smoker EXAM: PORTABLE CHEST 1 VIEW COMPARISON:  Portable exam 1618 hours compared to 0901 hours FINDINGS: Tip of endotracheal tube projects 6.7 cm above carina. Nasogastric tube extends into abdomen. New RIGHT arm PICC line with tip projecting over SVC. Enlargement of cardiac silhouette. Bibasilar effusions and atelectasis. Significant improvement in aeration of LEFT lung since previous exam. No pneumothorax. Bones demineralized. IMPRESSION: Tip of RIGHT arm PICC line projects over SVC. Enlargement of cardiac silhouette with bibasilar pleural effusions and atelectasis. Improved aeration since earlier study. Electronically Signed   By: Ulyses Southward M.D.   On: 06/11/2015 16:39   Portable Chest Xray  06/03/2015  CLINICAL DATA:  Evaluate endotracheal tube placement EXAM: PORTABLE CHEST 1 VIEW COMPARISON:  Radiograph 06/07/2015 FINDINGS: Endotracheal tube is positioned approximately 9 cm from the  carina. Endotracheal tube is in the high thoracic inlet. NG tube extends the stomach. Near complete opacification of the LEFT hemi thorax. Enlarged cardiac silhouette. RIGHT pleural effusion. Vascular congestion the RIGHT lung. IMPRESSION: 1. Endotracheal tube is slightly high in the thoracic inlet measuring 9.5 cm from carina. Consider advancing 2 to 3 cm. 2. NG tube in stomach. 3. No significant change in near complete opacification of the LEFT hemi thorax. Electronically Signed   By: Genevive Bi M.D.   On: 05/22/2015 09:27   Dg Chest Port 1 View  06/03/2015  CLINICAL DATA:  Respiratory failure EXAM: PORTABLE CHEST 1 VIEW COMPARISON:  06/05/2015 FINDINGS: Cardiomediastinal silhouette is stable. Again noted complete opacification of left hemithorax probable combination of pleural effusion with atelectasis or infiltrate. There is small right pleural effusion with right basilar atelectasis or infiltrate. No convincing pulmonary edema. IMPRESSION: Again noted complete opacification of left hemithorax probable combination of pleural effusion with atelectasis or infiltrate. There is small right pleural effusion with right basilar atelectasis or infiltrate. No convincing pulmonary edema. Electronically Signed   By: Lang Snow  Pop M.D.   On: 05/03/2015 08:31   CXR 05/20: Improved left lung aeration   ASSESSMENT / PLAN:  PULMONARY A: Acute hypoxic respiratory failure s/p self-extubation Pulmonary edema B pleural effusions Likely atelectasis of L lung P:   Supplemental O2 to maintain SpO2 > 90% BiPAP prn Nebulized bronchodilators DNI  CARDIOVASCULAR A:  Elevated cardiac markers - peak troponin 1.85 (05/11)-not a candidate for aggressive eval or intervention S/P bradycardic PEA X 2 arrest 05/13 Severe ischemic cardiomyopathy History of hypertension PAF with RVR (now sinus with LBBB) Severe PVD with L foot ischemia P:  Cont enteral amiodarone Monitor BP and rhythm DNR  RENAL A:   AKI,  resolved Likely some component of CKD Hypervolemia - resolved Loop diuretic induced metabolic acidosis - resolved P:   Monitor BMET intermittently Monitor I/Os Correct electrolytes as indicated  GASTROINTESTINAL A:   No acute issues P:   D/c TFs and OGT Bedside swallow screen; if unsuccessful, will request full speech and swallow eval.  HEMATOLOGIC A:   Anemia without overt bleeding - improving P:  DVT px: LMWH Monitor CBC intermittently Transfuse per usual guidelines  One unit PRBCs 05/17 for Hgb 7.0  INFECTIOUS A:   LLE wound infection P:   Monitor temp, WBC count Micro and abx as above Podiatry and vasc surgery following   ENDOCRINE A:   DM 2, adequately controlled P:   Change SSI to ACHS  NEUROLOGIC A:   Acute encephalopathy, resolved  Chronic pain, controlled Agitation, controlled P:   RASS goal: 0 DC PAD protocol  I spoke with pt who indicates that he wishes to forgo any further mechanical ventilation and ACLS. I have ordered DNR    Billy Fischeravid Courtnee Myer, MD PCCM service Mobile [phone] Pager [phone] 06/07/2015    06/07/2015, 7:39 AM

## 2015-06-07 NOTE — Clinical Social Work Note (Signed)
Clinical Social Work Assessment  Patient Details  Name: Johnny Lane MRN: [id number] Date of Birth: 08/07/1950  Date of referral:  06/07/15               Reason for consult:  Discharge Planning                Permission sought to share information with:  Family Supports Permission granted to share information::  Yes, Verbal Permission Granted  Name::     [place]::  No  Relationship::  yes  Contact Information:  yes  Housing/Transportation Living arrangements for the past 2 months:  [place] of Information:  Patient Patient Interpreter Needed:  None Criminal Activity/Legal Involvement Pertinent to Current Situation/Hospitalization:  No - Comment as needed Significant Relationships:  Siblings, Friend Lives with:  Self Do you feel safe going back to the place where you live?  Yes Need for family participation in patient care:  No (Coment)  Care giving concerns: None   Facilities manager / plan:  LCSW met with patient he is HARD of hearing ( speak loudly) Patient plans to return home with home health if this can be arranged. He has issues walking ( diabetic ulcers on his feet. He reports he can do mostly everything provided he is in a sitting position ( uses wheelchair) Patient is insured with medicare and medicaid and received a SSDI cheque and manages fine. His refused any discussion on skilled nursing or ALF. He has home 02 nasal canula 3.5 litres.He is oriented x3. Patient is polite and cooperative. Verbal consent provided to speak to his friend Probation officer  Employment status:  Disabled (Comment on whether or not currently receiving Disability) ([employer]) Insurance information:  Medicare, Medicaid In [place] PT Recommendations:  Not assessed at this time Information / Referral to community resources:    Provided a Tourist information centre manager   Patient/Family's Response to care: Im fine returning home  Patient/Family's Understanding of and  Emotional Response to Diagnosis, Current Treatment, and Prognosis: He understands he has medical and life threatening conditions but he refuses to live in ALF or SNF because he reports he will be dead ( LCSW did offer support and discussed benefits of supported living. He was not interested.  Emotional Assessment Appearance:   Stated age Attitude/Demeanor/Rapport:  Guarded Affect (typically observed):  Accepting, Hopeful Orientation:  Oriented to Self, Oriented to Place, Oriented to  Time, Oriented to Situation Alcohol / Substance use:  Alcohol Use, Tobacco Use (Quit smoking) Psych involvement (Current and /or in the community):  No (Comment)  Discharge Needs  Concerns to be addressed:  No discharge needs identified Readmission within the last 30 days:  No Current discharge risk:  Lives alone Barriers to Discharge:  No Barriers Identified   Joana Reamer, LCSW 06/07/2015, 11:29 AM

## 2015-06-07 NOTE — Progress Notes (Signed)
Pt cannot find his dentures. They were not in his denture cup at bedside and pt stated that they were wrapped in a paper towel on the sink in his room on ICU. RN called over to ICU and they were not in his previous room. Will continue to search belongings and ask EVS if they removed them when cleaning his ICU room he was previoously in.

## 2015-06-07 NOTE — Progress Notes (Signed)
Brief Nutrition Note:   Pt s/p self-extubation last night, TF discontinued. Diet advanced to Soft, Carb Modified. Recommend restarting EnsureEnlive TID between meals. Follow diet tolerance, po intake, supplement acceptance  Romelle Starcherate Ajanae Virag MS, RD, LDN [phone] Pager  [phone] Weekend/On-Call Pager

## 2015-06-07 NOTE — Progress Notes (Signed)
[facility] [place] [facility]   PATIENT NAME: Jaxsen Bernhart    MR#:  [id number]  DATE OF BIRTH:  08-25-1950  SUBJECTIVE:  CHIEF COMPLAINT:   Chief Complaint  Patient presents with  . Altered Mental Status   -Patient with severe cardiomyopathy, peripheral vascular disease, chronic venous stasis in legs, diabetes mellitus admitted to the floor secondary to CHF exacerbation. Had an asystole/PEA arrest and intubated. Extubated and required bronchoscopy for thick left-sided secretions on 05/21/2015. - self extubated last night, now on 100% NRB - alert and nodding head to questions appropriately  REVIEW OF SYSTEMS:  Review of Systems  Unable to perform ROS: critical illness    DRUG ALLERGIES:  No Known Allergies  VITALS:  Blood pressure 155/63, pulse 69, temperature 97.8 F (36.6 C), temperature source Oral, resp. rate 21, height 5\' 10"  (1.778 m), weight 84.6 kg (186 lb 8.2 oz), SpO2 100 %.  PHYSICAL EXAMINATION:  Physical Exam  GENERAL:  65 y.o.-year-old chronically ill appearing patient lying in the bed with no acute distress.  EYES: Pupils equal, round, reactive to light and accommodation. No scleral icterus. Extraocular muscles intact.  HEENT: Head atraumatic, normocephalic. Oropharynx and nasopharynx clear.  NECK:  Supple, no jugular venous distention. No thyroid enlargement, no tenderness.  LUNGS: Normal breath sounds bilaterally, no wheezing, rales,rhonchi or crepitation. No use of accessory muscles of respiration. Decreased bibasilar breath sounds. CARDIOVASCULAR: S1, S2 normal. No murmurs, rubs, or gallops.  ABDOMEN: Soft, nontender, nondistended. Bowel sounds present. No organomegaly or mass.  EXTREMITIES: Cold to touch hands and feet. Unable to palpate dorsalis pedis pulses. Chronic venous stasis and skin pigmentation changes on both lower extremities. Left lower extremity ulcerations or in dressing. NEUROLOGIC: Cranial nerves II through XII  are intact. Moving all extremities. Global weakness noted.. Sensation intact. Gait not checked.  PSYCHIATRIC: The patient is alert and oriented x 3.  SKIN: Left leg ulcerations in a dressing    LABORATORY PANEL:   CBC  Recent Labs Lab 06/07/15 0425  WBC 14.1*  HGB 8.3*  HCT 25.9*  PLT 298   ------------------------------------------------------------------------------------------------------------------  Chemistries   Recent Labs Lab 06/02/15 0402  06/09/2015 0337 06/07/15 0425  NA 144  < > 144 145  K 3.2*  < > 3.6 3.1*  CL 109  < > 106 109  CO2 31  < > 31 33*  GLUCOSE 139*  < > 165* 153*  BUN 47*  < > 35* 33*  CREATININE 1.33*  < > 1.14 0.99  CALCIUM 7.4*  < > 8.3* 8.0*  MG 2.3  --   --   --   AST 742*  --  22  --   ALT 521*  --  77*  --   ALKPHOS 57  --  64  --   BILITOT 1.3*  --  1.0  --   < > = values in this interval not displayed. ------------------------------------------------------------------------------------------------------------------  Cardiac Enzymes  Recent Labs Lab 05/31/15 1556  TROPONINI 1.56*   ------------------------------------------------------------------------------------------------------------------  RADIOLOGY:  Dg Abd 1 View  06/10/2015  CLINICAL DATA:  NG tube placement EXAM: ABDOMEN - 1 VIEW COMPARISON:  05/31/2015 FINDINGS: NG tube tip remains in the stomach. The stomach is markedly distended. No obvious free intraperitoneal gas. Bilateral pleural effusions. No disproportionate dilatation of small or large bowel. IMPRESSION: NG tube remains in the stomach. The stomach is markedly distended with gas. Electronically Signed   By: Jolaine Click M.D.   On: 06/05/2015 09:27  Dg Chest Port 1 View  06-29-15  CLINICAL DATA:  PICC line placement, hypertension, diabetes mellitus, former smoker EXAM: PORTABLE CHEST 1 VIEW COMPARISON:  Portable exam 1618 hours compared to 0901 hours FINDINGS: Tip of endotracheal tube projects 6.7 cm above  carina. Nasogastric tube extends into abdomen. New RIGHT arm PICC line with tip projecting over SVC. Enlargement of cardiac silhouette. Bibasilar effusions and atelectasis. Significant improvement in aeration of LEFT lung since previous exam. No pneumothorax. Bones demineralized. IMPRESSION: Tip of RIGHT arm PICC line projects over SVC. Enlargement of cardiac silhouette with bibasilar pleural effusions and atelectasis. Improved aeration since earlier study. Electronically Signed   By: Ulyses Southward M.D.   On: 2015-06-29 16:39   Portable Chest Xray  06-29-2015  CLINICAL DATA:  Evaluate endotracheal tube placement EXAM: PORTABLE CHEST 1 VIEW COMPARISON:  Radiograph 06/29/15 FINDINGS: Endotracheal tube is positioned approximately 9 cm from the carina. Endotracheal tube is in the high thoracic inlet. NG tube extends the stomach. Near complete opacification of the LEFT hemi thorax. Enlarged cardiac silhouette. RIGHT pleural effusion. Vascular congestion the RIGHT lung. IMPRESSION: 1. Endotracheal tube is slightly high in the thoracic inlet measuring 9.5 cm from carina. Consider advancing 2 to 3 cm. 2. NG tube in stomach. 3. No significant change in near complete opacification of the LEFT hemi thorax. Electronically Signed   By: Genevive Bi M.D.   On: 06/29/2015 09:27   Dg Chest Port 1 View  June 29, 2015  CLINICAL DATA:  Respiratory failure EXAM: PORTABLE CHEST 1 VIEW COMPARISON:  06/05/2015 FINDINGS: Cardiomediastinal silhouette is stable. Again noted complete opacification of left hemithorax probable combination of pleural effusion with atelectasis or infiltrate. There is small right pleural effusion with right basilar atelectasis or infiltrate. No convincing pulmonary edema. IMPRESSION: Again noted complete opacification of left hemithorax probable combination of pleural effusion with atelectasis or infiltrate. There is small right pleural effusion with right basilar atelectasis or infiltrate. No convincing  pulmonary edema. Electronically Signed   By: Natasha Mead M.D.   On: 06-29-2015 08:31    EKG:   Orders placed or performed during the hospital encounter of 05/19/2015  . EKG 12-Lead  . EKG 12-Lead  . EKG 12-Lead  . EKG 12-Lead  . EKG 12-Lead  . EKG 12-Lead    ASSESSMENT AND PLAN:   65 year old male with past medical history significant for peripheral vascular disease with chronic venous stasis, hypertension, diabetes mellitus who lives at home by himself and followed by a home care nurse, admitted secondary to lower extremity ulcers. Patient had a PEA arrest on the floor and intubated and transferred to ICU.  #1 acute hypoxic respiratory failure-status post PEA arrest.  -Left lung collapse secondary to pneumonia. Status post bronchoscopy and cultures are pending. -Extubated on 100% nonrebreather. Wean oxygen as tolerated. Follow-up chest x-rays pending. -Appreciate pulmonary consult -Continue Rocephin for now  #2 sepsis-initially thought to be secondary to lower extremity ulcers. Currently on Rocephin. MRI is negative for any osteomyelitis. After blood pressures improving. No further fevers. Blood cultures negative  #3 atrial fibrillation with rapid ventricular response-well-controlled heart rate. Continue amiodarone. Not an anticoagulation candidate due to anemia  #4 diabetes mellitus-currently nothing by mouth as just extubated. -On sliding scale insulin  #5 anemia-acute on chronic. Baseline hemoglobin around 9. Received transfusion this admission and hemoglobin is at 8.3. -Continue to monitor  #6 peripheral vascular disease-appreciate vascular consult. Plan is to do angiogram and patient is medically stable. -Poor circulation in both lower extremities with ulcerations  noted. Continue Plavix  #7 DVT prophylaxis-on Lovenox  #8 severe cardiomyopathy-systolic dysfunction. EF of 25-30%. -Follow up x-ray and add low-dose Lasix for maintenance. -We'll start betablocker, ACEI or  ARB if blood pressure allows.    All the records are reviewed and case discussed with Care Management/Social Workerr. Management plans discussed with the patient, family and they are in agreement.  CODE STATUS: Full code Discussed with patient as he was alert and oriented this morning. Patient is okay with short-term intubation and resuscitation if needed again. Do not want to have a tracheostomy.  TOTAL CRITICAL CARE TIME SPENT IN TAKING CARE OF THIS PATIENT: 40 minutes.   POSSIBLE D/C IN ? DAYS, DEPENDING ON CLINICAL CONDITION.   Square Jowett M.D on 06/07/2015 at 8:35 AM  Between 7am to 6pm - Pager - [phone]  After 6pm go to [email or web address] - password [email or web address]  [facility]  Office  [phone]  CC: Primary care physician; Leotis ShamesSingh,[name], MD

## 2015-06-07 NOTE — Progress Notes (Signed)
While in another isolation room, heard ventilator alarm going off, peaked outside room and noticed Posey Boyerathy Dunn RN in room 6 and pt with tube out in his hand. Pt suctioned and ambu bag initiated. O2 sats was 87 on RA, 99% on ambu bag. Maude LericheMagdalene Tukov NP, Jomarie LongsJoseph RT and myself at bedside, changed to 100% NRB, O2 sats 99%, pt axox4, no stridor nor respiratory distress noted. Asked about code status, requests no reintubation nor CPR, requests to be a DNR.

## 2015-06-07 NOTE — Progress Notes (Signed)
RT called to CCU that patient had self extubated. Patient is currently on  NRB mask and is oriented to time and place. Magdaline NP at bedside.

## 2015-06-07 NOTE — Plan of Care (Signed)
Problem: Skin Integrity: Goal: Risk for impaired skin integrity will decrease Outcome: Progressing Dressings in place, heels elevated, turned every two hours

## 2015-06-07 NOTE — Progress Notes (Signed)
Daily Progress Note   Subjective  - 6 Days Post-Op  Patient with severe cardiomyopathy, peripheral vascular disease, chronic venous stasis in legs, diabetes mellitus admitted to the floor secondary to CHF exacerbation. Had an asystole/PEA arrest and intubated. Extubated and required bronchoscopy for thick left-sided secretions on 06/12/2015. - self extubated last night, now on 100% NRB AO at this time.  Sitting up with speech theratpy eating.  Objective Filed Vitals:   06/07/15 0454 06/07/15 0455 06/07/15 0500 06/07/15 0600  BP:   139/68 155/63  Pulse:  65 63 69  Temp:      TempSrc:      Resp:  15 13 21   Height:      Weight: 84.6 kg (186 lb 8.2 oz)     SpO2:  100% 100% 100%    Physical Exam: Right lower leg erythema is markedly improved.  Heel ulcer with drainage.  Padding placed.  Left foot dressing changed.  Fair amount of purulence from plantar central wound.  Also drainage around 1st mtpj and right heel ulcer.  Laboratory CBC    Component Value Date/Time   WBC 14.1* 06/07/2015 0425   WBC 8.8 12/25/2013 1136   HGB 8.3* 06/07/2015 0425   HGB 14.2 12/25/2013 1136   HCT 25.9* 06/07/2015 0425   HCT 43.7 12/25/2013 1136   PLT 298 06/07/2015 0425   PLT 210 12/25/2013 1136    BMET    Component Value Date/Time   NA 145 06/07/2015 0425   NA 140 12/25/2013 1136   K 3.1* 06/07/2015 0425   K 3.9 12/25/2013 1136   CL 109 06/07/2015 0425   CL 107 12/25/2013 1136   CO2 33* 06/07/2015 0425   CO2 30 12/25/2013 1136   GLUCOSE 153* 06/07/2015 0425   GLUCOSE 269* 12/25/2013 1136   BUN 33* 06/07/2015 0425   BUN 21* 12/25/2013 1136   CREATININE 0.99 06/07/2015 0425   CREATININE 0.71 12/25/2013 1136   CALCIUM 8.0* 06/07/2015 0425   CALCIUM 8.4* 12/25/2013 1136   GFRNONAA >60 06/07/2015 0425   GFRNONAA >60 12/25/2013 1136   GFRAA >60 06/07/2015 0425   GFRAA >60 12/25/2013 1136    Assessment/Planning: Dressing changed and will continue with monitoring Pt still critically  ill and awaiting clearance for vascular procedures   Will continue to loosely follow until stable for I&D of abscess and debridement of necrotic tissue.    Johnny Lane, Johnny Mcgraw A  06/07/2015, 10:31 AM

## 2015-06-07 NOTE — Evaluation (Signed)
Clinical/Bedside Swallow Evaluation Patient Details  Name: Johnny RastDavid Lane MRN: [id number] Date of Birth: 04/06/1950  Today's Date: 06/07/2015 Time: SLP Start Time (ACUTE ONLY): 1029 SLP Stop Time (ACUTE ONLY): 1100 SLP Time Calculation (min) (ACUTE ONLY): 31 min  Past Medical History:  Past Medical History  Diagnosis Date  . Diabetes mellitus with neuropathy (HCC)   . Benign essential HTN   . PVD (peripheral vascular disease) [facility](HCC)    Past Surgical History:  Past Surgical History  Procedure Laterality Date  . Peripheral vascular catheterization  01/09/2015    Procedure: Lower Extremity Intervention;  Surgeon: Annice NeedyJason S Dew, MD;  [facility]AS[facility] CV LAB;  Service: Cardiovascular;;  . Peripheral vascular catheterization N/A 01/09/2015    Procedure: Abdominal Aortogram w/Lower Extremity;  Surgeon: Annice NeedyJason S Dew, MD;  Location: [facility] INVASIVE CV LAB;  Service: Cardiovascular;  Laterality: N/A;   HPI:      Assessment / Plan / Recommendation Clinical Impression  pt presented with mild oral dysphagia scharactorized by lengthy cmastication with solids, however dentures were no present. pt able to intake all semi soft solids with no overt ssx aspiration. pt with minimal belching with multiple sips of thin liquid via straw however pt had not had thin liquids and upon the initial long swallow pt withj controlled swallows and no overt ssx aspiration. st educated pt, and nursing on diet recommendations and safety. Both gave verbal understanding and agreement. ST recommends soft with thin diet to remain.    Aspiration Risk  Mild aspiration risk    Diet Recommendation Dysphagia 3 (Mech soft);Thin liquid   Liquid Administration via: Cup Medication Administration: Crushed with puree Supervision: Patient able to self feed Compensations: Minimize environmental distractions;Slow rate;Small sips/bites;Follow solids with liquid Postural Changes: Seated upright at 90 degrees    Other  Recommendations  Oral Care Recommendations: Oral care BID   Follow up Recommendations       Frequency and Duration min 2x/week  1 week       Prognosis Prognosis for Safe Diet Advancement: Good      Swallow Study   General Date of Onset: 06/05/15 Type of Study: Bedside Swallow Evaluation Diet Prior to this Study: Dysphagia 3 (soft);Thin liquids Temperature Spikes Noted: No Respiratory Status: Nasal cannula History of Recent Intubation: Yes Date extubated: 06/07/15 Behavior/Cognition: Alert;Cooperative;Pleasant mood Oral Cavity Assessment: Within Functional Limits Oral Care Completed by SLP: No Oral Cavity - Dentition: Dentures, not available Vision: Functional for self-feeding Self-Feeding Abilities: Able to feed self Patient Positioning: Upright in bed Baseline Vocal Quality: Normal Volitional Cough: Strong Volitional Swallow: Able to elicit    Oral/Motor/Sensory Function Overall Oral Motor/Sensory Function: Within functional limits   Ice Chips Ice chips: Within functional limits Presentation: Spoon;Self Fed   Thin Liquid Thin Liquid: Within functional limits Presentation: Spoon;Self Fed;Straw    Nectar Thick Nectar Thick Liquid: Not tested   Honey Thick Honey Thick Liquid: Not tested   Puree Puree: Within functional limits Presentation: Self Fed;Spoon   Solid   GO   Solid: Within functional limits Presentation: Self Fed;Spoon Other Comments: pt does not have in dentures, requires soft fods         Magdalynn Davilla Harris Sauber 06/07/2015,11:34 AM

## 2015-06-07 NOTE — Progress Notes (Signed)
Pt resting comfortably, breathing even and unlabored, pt still on 100%NRB with O2 sat at 100%, RR 19, no s/s of distress noted, VSS.

## 2015-06-08 ENCOUNTER — Inpatient Hospital Stay: Payer: Medicare Other

## 2015-06-08 DIAGNOSIS — F4321 Adjustment disorder with depressed mood: Secondary | ICD-10-CM

## 2015-06-08 DIAGNOSIS — Z7189 Other specified counseling: Secondary | ICD-10-CM

## 2015-06-08 DIAGNOSIS — J449 Chronic obstructive pulmonary disease, unspecified: Secondary | ICD-10-CM

## 2015-06-08 LAB — CULTURE, RESPIRATORY W GRAM STAIN

## 2015-06-08 LAB — BLOOD GAS, ARTERIAL
ACID-BASE EXCESS: 10.9 mmol/L — AB (ref 0.0–3.0)
ALLENS TEST (PASS/FAIL): POSITIVE — AB
Bicarbonate: 38.8 mEq/L — ABNORMAL HIGH (ref 21.0–28.0)
FIO2: 0.36
O2 Saturation: 72.7 %
PCO2 ART: 72 mmHg — AB (ref 32.0–48.0)
PH ART: 7.34 — AB (ref 7.350–7.450)
Patient temperature: 37
pO2, Arterial: 41 mmHg — ABNORMAL LOW (ref 83.0–108.0)

## 2015-06-08 LAB — GLUCOSE, CAPILLARY
GLUCOSE-CAPILLARY: 183 mg/dL — AB (ref 65–99)
Glucose-Capillary: 203 mg/dL — ABNORMAL HIGH (ref 65–99)

## 2015-06-08 MED ORDER — BUDESONIDE 0.25 MG/2ML IN SUSP: 0.2500 mg | Freq: Two times a day (BID) | RESPIRATORY_TRACT | Status: DC

## 2015-06-08 MED ORDER — GLYCOPYRROLATE 0.2 MG/ML IJ SOLN
0.1000 mg | INTRAMUSCULAR | Status: DC | PRN
  Filled 2015-06-08: qty 0.5

## 2015-06-08 MED ORDER — LORAZEPAM 2 MG/ML IJ SOLN: 1.0000 mg | INTRAMUSCULAR | Status: DC | PRN

## 2015-06-08 MED ORDER — IPRATROPIUM-ALBUTEROL 0.5-2.5 (3) MG/3ML IN SOLN: 3.0000 mL | RESPIRATORY_TRACT | Status: DC | PRN

## 2015-06-08 MED ORDER — GUAIFENESIN ER 600 MG PO TB12
1200.0000 mg | ORAL_TABLET | Freq: Two times a day (BID) | ORAL | Status: DC
  Administered 2015-06-08: 1200 mg via ORAL
  Filled 2015-06-08: qty 2

## 2015-06-08 MED ORDER — SCOPOLAMINE 1 MG/3DAYS TD PT72
1.0000 | MEDICATED_PATCH | TRANSDERMAL | Status: DC
  Administered 2015-06-08: 1.5 mg via TRANSDERMAL
  Filled 2015-06-08: qty 1

## 2015-06-08 MED ORDER — MORPHINE SULFATE (CONCENTRATE) 10 MG/0.5ML PO SOLN: 10.0000 mg | ORAL | Status: DC | PRN

## 2015-06-08 MED ORDER — INSULIN GLARGINE 100 UNIT/ML ~~LOC~~ SOLN
8.0000 [IU] | Freq: Every day | SUBCUTANEOUS | Status: DC
  Filled 2015-06-08: qty 0.08

## 2015-06-13 SURGERY — LOWER EXTREMITY ANGIOGRAPHY
Anesthesia: Moderate Sedation

## 2015-06-19 NOTE — Progress Notes (Signed)
   05/28/2015 1540  Clinical Encounter Type  Visited With Patient and family together  Visit Type Death  Referral From Nurse  Consult/Referral To Chaplain  Spiritual Encounters  Spiritual Needs Prayer  Stress Factors  Patient Stress Factors Loss  Family Stress Factors Loss;Family relationships  Provided grief support and prayer to patient's sister Arline Asp(Cindy). Chap. Baraka Klatt G. Jonathin Heinicke, ext. [phone]

## 2015-06-19 NOTE — Progress Notes (Signed)
Labored respirations, confused and poorly oriented  Filed Vitals:   06/07/15 2032 06/05/2015 0222 06/07/2015 0543 06/18/2015 0759  BP:   147/56 152/63  Pulse:   74 79  Temp:   97.8 F (36.6 C) 97.6 F (36.4 C)  TempSrc:   Oral Axillary  Resp:   22   Height:      Weight:      SpO2: 95% 88% 93% 97%   Disheveled Moderately labored respirations Reg, no M noted Diffusely diminished BS with scattered rhonchi  NABS Severe chronic stasis changes L foot in surgical dressing  BMP Latest Ref Rng 06/07/2015 06/14/2015 06/05/2015  Glucose 65 - 99 mg/dL 409(W153(H) 119(J165(H) 478(G231(H)  BUN 6 - 20 mg/dL 95(A33(H) 21(H35(H) 08(M43(H)  Creatinine 0.61 - 1.24 mg/dL 5.780.99 4.691.14 6.291.22  Sodium 135 - 145 mmol/L 145 144 144  Potassium 3.5 - 5.1 mmol/L 3.1(L) 3.6 3.9  Chloride 101 - 111 mmol/L 109 106 105  CO2 22 - 32 mmol/L 33(H) 31 31  Calcium 8.9 - 10.3 mg/dL 8.0(L) 8.3(L) 8.0(L)    CBC Latest Ref Rng 06/07/2015 06/15/2015 06/05/2015  WBC 3.8 - 10.6 K/uL 14.1(H) 10.7(H) 11.5(H)  Hemoglobin 13.0 - 18.0 g/dL 8.3(L) 9.2(L) 8.8(L)  Hematocrit 40.0 - 52.0 % 25.9(L) 28.7(L) 27.2(L)  Platelets 150 - 440 K/uL 298 342 277    CXR: edema pattern with B effusions and probable component of basilar atx  IMPRESSION: End stage cardiomyopathy End stage COPD Severe PVD  He has been intubated twice and has demonstrated an inability to improve sufficiently such that we might have a hope that he can recover enough to undergo surgery or even be discharged from hospital. When he was fully intact cognitively yesterday, he clearly indicated that he did not wish to undergo re-intubation or ACLS. His HCPOA has already raised concern about whether our interventions are going to lead to a favorable outcome and I spoke with his sister last week who expressed similar concerns  PLAN/REC: I have encouraged RN to provide morphine for distress I favor that he not return to ICU/SDU to receive more of the care that has failed to change the course of this  previously Continue nebulized bronchodilators and PRN morphine I have spoken with Dr Jack QuartoKalisetti  Johnny Li Fragoso, MD PCCM service Mobile [phone] Pager [phone] 06/17/2015

## 2015-06-19 NOTE — Progress Notes (Signed)
Dr Nemiah CommanderKalisetti notified of critical CO2 values and ABG results.

## 2015-06-19 NOTE — Progress Notes (Signed)
Discussed with patient and his POA Mr. Johnny Lane at bedside They want him to be comfort care, transfer to oncology floor Orders placed Appreciate psych input as well Discontinue sitter

## 2015-06-19 NOTE — Progress Notes (Signed)
Rounded on pt at 15:15, sister at bedside, pt was noted to have no respirations or heart beat, DNR and palliative care ordered. Second nurse verified, Gladstone LighterAlecia RN.  Nursing supervisor notified.  Dr. Nemiah CommanderKalisetti notified. Pt's POA Melodie BouillonMathew Diehl notified and arrived within the hour.  [place] donor notified.

## 2015-06-19 NOTE — Progress Notes (Signed)
Pt called for nurse to come into room. Pt hands me a note that says, "place me on the ventilator for two days, then let me go." I asked the pt if he meant to place him on the ventilator and then allow him to go home after two days. Pt shook his head. I asked what he meant and he said "to die." Pt reported that he felt as though he would not make it until the morning. Dr. Anne HahnWillis was contacted and a sitter was ordered for pt.

## 2015-06-19 NOTE — Consult Note (Signed)
[facility] Consult   Reason for Consult:  Suicidal  Referring Physician:  Maretta Los Patient Identification: Johnny Lane MRN:  [id number] Principal Diagnosis: Mood disorder  Diagnosis:   Patient Active Problem List   Diagnosis Date Noted  . Lung collapse [J98.19]   . Acute respiratory failure ([place]) [J96.00]   . Cardiac arrest ([place]) [I46.9]   . Sepsis ([place]) [A41.9] 05/30/2015  . Pressure ulcer [L89.90] 01/11/2015  . Cellulitis [L03.90] 01/08/2015    Total Time spent with patient: 45 minutes  Subjective:   Patient is a 65 year old male who was admitted due to COPD.  HPI:   Patient is a 65year-old male who was evaluated in the presence of his POA Matt. Psychiatric consult was placed as patient has been expressing suicidal ideation since yesterday. He has severe respiratory issues and he was noticed to have placed his oxygen tubing in his mouth as he reported that he is getting more air through his mouth. He reported that he has in getting progressively worse and his medical conditions are not improving. When he got upset yesterday he expressed suicidal ideations but he does not want to die. I spoke with Dr.Kalsetti he was placed on suicidal ideations due to the way he describes his situation  He has been intubated twice and has demonstrated an inability to improve sufficiently such that we might have a hope that he can recover enough to undergo surgery or even be discharged from hospital. When he was fully intact cognitively yesterday, he clearly indicated that he did not wish to undergo re-intubation or ACLS. His HCPOA has already raised concern about whether our interventions are going to lead to a favorable outcome and I spoke with his sister last week who expressed similar concerns  Patient currently denied having any suicidal ideations or plans. He denied having any perceptual disturbances. He appeared calm and alert and was able to talk to his be POA.  Past Psychiatric  History:  Patient does not have any previous suicide attempts in the past.  Risk to Self: Is patient at risk for suicide?: No Risk to Others:   Prior Inpatient Therapy:   Prior Outpatient Therapy:    Past Medical History:  Past Medical History  Diagnosis Date  . Diabetes mellitus with neuropathy ([place])   . Benign essential HTN   . PVD (peripheral vascular disease) [facility])     Past Surgical History  Procedure Laterality Date  . Peripheral vascular catheterization  01/09/2015    Procedure: Lower Extremity Intervention;  Surgeon: Algernon Huxley, MD;  Location: [facility];  Service: Cardiovascular;;  . Peripheral vascular catheterization N/A 01/09/2015    Procedure: Abdominal Aortogram w/Lower Extremity;  Surgeon: Algernon Huxley, MD;  Location: [facility];  Service: Cardiovascular;  Laterality: N/A;   Family History: History reviewed. No pertinent family history. Family Psychiatric  History: None reported  Social History:  History  Alcohol Use: Not on file     History  Drug Use Not on file    Social History   Social History  . Marital Status: Single    Spouse Name: N/A  . Number of Children: N/A  . Years of Education: N/A   Social History Main Topics  . Smoking status: Former Research scientist (life sciences)  . Smokeless tobacco: None  . Alcohol Use: None  . Drug Use: None  . Sexual Activity: Not Asked   Other Topics Concern  . None   Social History Narrative   Additional Social History:  He has a sister who is currently involved in his care at this time. He also has a healthcare power of attorney who is actively involved in his care and medical decision making  Allergies:  No Known Allergies  Labs:  Results for orders placed or performed during the hospital encounter of 06/15/2015 (from the past 48 hour(s))  Glucose, capillary     Status: Abnormal   Collection Time: 05/28/2015  4:10 PM  Result Value Ref Range   Glucose-Capillary 129 (H) 65 - 99 mg/dL  Glucose, capillary      Status: Abnormal   Collection Time: 06/04/2015  7:33 PM  Result Value Ref Range   Glucose-Capillary 119 (H) 65 - 99 mg/dL   Comment 1 Notify RN   Glucose, capillary     Status: Abnormal   Collection Time: 05/22/2015 11:29 PM  Result Value Ref Range   Glucose-Capillary 102 (H) 65 - 99 mg/dL   Comment 1 Notify RN   Glucose, capillary     Status: Abnormal   Collection Time: 06/07/15  3:57 AM  Result Value Ref Range   Glucose-Capillary 145 (H) 65 - 99 mg/dL   Comment 1 Notify RN   Basic metabolic panel     Status: Abnormal   Collection Time: 06/07/15  4:25 AM  Result Value Ref Range   Sodium 145 135 - 145 mmol/L   Potassium 3.1 (L) 3.5 - 5.1 mmol/L   Chloride 109 101 - 111 mmol/L   CO2 33 (H) 22 - 32 mmol/L   Glucose, Bld 153 (H) 65 - 99 mg/dL   BUN 33 (H) 6 - 20 mg/dL   Creatinine, Ser 0.99 0.61 - 1.24 mg/dL   Calcium 8.0 (L) 8.9 - 10.3 mg/dL   GFR calc non Af Amer >60 >60 mL/min   GFR calc Af Amer >60 >60 mL/min    Comment: (NOTE) The eGFR has been calculated using the CKD EPI equation. This calculation has not been validated in all clinical situations. eGFR's persistently <60 mL/min signify possible Chronic Kidney Disease.    Anion gap 3 (L) 5 - 15  CBC     Status: Abnormal   Collection Time: 06/07/15  4:25 AM  Result Value Ref Range   WBC 14.1 (H) 3.8 - 10.6 K/uL   RBC 3.00 (L) 4.40 - 5.90 MIL/uL   Hemoglobin 8.3 (L) 13.0 - 18.0 g/dL   HCT 25.9 (L) 40.0 - 52.0 %   MCV 86.5 80.0 - 100.0 fL   MCH 27.7 26.0 - 34.0 pg   MCHC 32.0 32.0 - 36.0 g/dL   RDW 16.4 (H) 11.5 - 14.5 %   Platelets 298 150 - 440 K/uL  Glucose, capillary     Status: Abnormal   Collection Time: 06/07/15  7:39 AM  Result Value Ref Range   Glucose-Capillary 146 (H) 65 - 99 mg/dL   Comment 1 Notify RN   Glucose, capillary     Status: Abnormal   Collection Time: 06/07/15 11:36 AM  Result Value Ref Range   Glucose-Capillary 220 (H) 65 - 99 mg/dL   Comment 1 Notify RN   Glucose, capillary     Status:  Abnormal   Collection Time: 06/07/15  4:20 PM  Result Value Ref Range   Glucose-Capillary 238 (H) 65 - 99 mg/dL   Comment 1 Notify RN   Glucose, capillary     Status: Abnormal   Collection Time: 06/07/15  9:31 PM  Result Value Ref Range   Glucose-Capillary 186 (H)  65 - 99 mg/dL   Comment 1 Notify RN   Glucose, capillary     Status: Abnormal   Collection Time: 06/07/15 11:05 PM  Result Value Ref Range   Glucose-Capillary 160 (H) 65 - 99 mg/dL  Glucose, capillary     Status: Abnormal   Collection Time: 02-Jul-2015  7:42 AM  Result Value Ref Range   Glucose-Capillary 183 (H) 65 - 99 mg/dL   Comment 1 Notify RN   Blood gas, arterial     Status: Abnormal   Collection Time: 07/02/15 10:17 AM  Result Value Ref Range   FIO2 0.36    Delivery systems NASAL CANNULA    pH, Arterial 7.34 (L) 7.350 - 7.450   pCO2 arterial 72 (HH) 32.0 - 48.0 mmHg    Comment: CRITICAL RESULT CALLED TO, READ BACK BY AND VERIFIED WITH: [facility] RN AT 1051 ON 07-02-2015 AGP    pO2, Arterial 41 (L) 83.0 - 108.0 mmHg   Bicarbonate 38.8 (H) 21.0 - 28.0 mEq/L   Acid-Base Excess 10.9 (H) 0.0 - 3.0 mmol/L   O2 Saturation 72.7 %   Patient temperature 37.0    Collection site LEFT RADIAL    Sample type ARTERIAL DRAW    Allens test (pass/fail) POSITIVE (A) PASS  Glucose, capillary     Status: Abnormal   Collection Time: 02-Jul-2015 11:49 AM  Result Value Ref Range   Glucose-Capillary 203 (H) 65 - 99 mg/dL   Comment 1 Notify RN     Current Facility-Administered Medications  Medication Dose Route Frequency Provider Last Rate Last Dose  . acetaminophen (TYLENOL) tablet 650 mg  650 mg Oral Q6H PRN Nicholes Mango, MD   650 mg at 07/02/2015 1035  . albuterol (PROVENTIL) (2.5 MG/3ML) 0.083% nebulizer solution 2.5 mg  2.5 mg Nebulization Q3H PRN Wilhelmina Mcardle, MD   2.5 mg at 06/07/15 2343  . amiodarone (PACERONE) tablet 400 mg  400 mg Oral BID Wilhelmina Mcardle, MD   400 mg at 07-02-15 0931  . antiseptic oral rinse solution (CORINZ)  7  mL Mouth Rinse QID Mikael Spray, NP   7 mL at 07/02/15 1200  . bisacodyl (DULCOLAX) suppository 10 mg  10 mg Rectal Daily PRN Mikael Spray, NP   10 mg at 06/03/15 1059  . budesonide (PULMICORT) nebulizer solution 0.25 mg  0.25 mg Nebulization Q6H Wilhelmina Mcardle, MD   0.25 mg at 07-02-2015 0726  . carvedilol (COREG) tablet 3.125 mg  3.125 mg Oral BID WC Gladstone Lighter, MD   3.125 mg at 2015-07-02 0802  . cefTRIAXone (ROCEPHIN) 2 g in dextrose 5 % 50 mL IVPB  2 g Intravenous Q24H Bincy S Varughese, NP   2 g at 07-02-2015 1054  . chlorhexidine gluconate (SAGE KIT) (PERIDEX) 0.12 % solution 15 mL  15 mL Mouth Rinse BID Pavan Pyreddy, MD   15 mL at 07-02-15 0800  . clopidogrel (PLAVIX) tablet 75 mg  75 mg Oral Daily Wilhelmina Mcardle, MD   75 mg at 07-02-15 0931  . collagenase (SANTYL) ointment   Topical Daily Aruna Gouru, MD      . enoxaparin (LOVENOX) injection 40 mg  40 mg Subcutaneous Q24H Wilhelmina Mcardle, MD   40 mg at 06/07/15 2313  . feeding supplement (ENSURE ENLIVE) (ENSURE ENLIVE) liquid 237 mL  237 mL Oral TID BM Gladstone Lighter, MD   237 mL at 2015/07/02 1000  . furosemide (LASIX) tablet 20 mg  20 mg Oral Daily Gladstone Lighter,  MD   20 mg at June 30, 2015 0931  . guaiFENesin (MUCINEX) 12 hr tablet 1,200 mg  1,200 mg Oral BID Gladstone Lighter, MD   1,200 mg at 2015-06-30 1035  . insulin aspart (novoLOG) injection 0-15 Units  0-15 Units Subcutaneous TID WC Wilhelmina Mcardle, MD   5 Units at 2015-06-30 1157  . insulin aspart (novoLOG) injection 0-5 Units  0-5 Units Subcutaneous QHS Wilhelmina Mcardle, MD   0 Units at 06/07/15 2200  . insulin glargine (LANTUS) injection 8 Units  8 Units Subcutaneous QHS Gladstone Lighter, MD      . ipratropium-albuterol (DUONEB) 0.5-2.5 (3) MG/3ML nebulizer solution 3 mL  3 mL Nebulization Q6H Wilhelmina Mcardle, MD   3 mL at 06/30/2015 0726  . morphine 2 MG/ML injection 2-4 mg  2-4 mg Intravenous Q3H PRN Wilhelmina Mcardle, MD   2 mg at Jun 30, 2015 1050  . sennosides  (SENOKOT) 8.8 MG/5ML syrup 5 mL  5 mL Oral BID PRN Wilhelmina Mcardle, MD      . sodium chloride flush (NS) 0.9 % injection 3 mL  3 mL Intravenous Q12H Nicholes Mango, MD   3 mL at 30-Jun-2015 1000    Musculoskeletal: Strength & Muscle Tone: decreased Gait & Station: not tested  Patient leans: N/A  Psychiatric Specialty Exam: Review of Systems  Psychiatric/Behavioral: Positive for depression. The patient is nervous/anxious.     Blood pressure 152/63, pulse 79, temperature 97.6 F (36.4 C), temperature source Axillary, resp. rate 22, height 5' 10"  (1.778 m), weight 186 lb 8.2 oz (84.6 kg), SpO2 97 %.Body mass index is 26.76 kg/(m^2).  General Appearance: Casual  Eye Contact::  Fair  Speech:  Slow  Volume:  Decreased  Mood:  Anxious  Affect:  Blunt  Thought Process:  Coherent  Orientation:  Full (Time, Place, and Person)  Thought Content:  WDL  Suicidal Thoughts:  No  Homicidal Thoughts:  No  Memory:  Immediate;   Fair  Judgement:  Fair  Insight:  Fair  Psychomotor Activity:  Psychomotor Retardation  Concentration:  Fair  Recall:  [employer] of Knowledge:Fair  Language: Fair  Akathisia:  No  Handed:  Right  AIMS (if indicated):     Assets:  Communication Skills  ADL's:  Impaired  Cognition: WNL  Sleep:      Treatment Plan Summary: Daily contact with patient to assess and evaluate symptoms and progress in treatment  Disposition: Patient does not meet criteria for psychiatric inpatient admission. Supportive therapy provided about ongoing stressors.   I have discussed the case with Dr. Corene Cornea and His POA. We have agreed with the plan the patient does not have any suicidal ideations. This is the way he talks. We will discontinue the sitter at this time. Patient is DO NOT RESUSCITATE at this time. He does not need any extensive measures to support his health. He is not suicidal and denied having any perceptual disturbances. His healthcare power of attorney is on board with this  treatment plan. Thank you for allowing me to participate  the care of this patient  Rainey Pines, MD 30-Jun-2015 12:45 PM

## 2015-06-19 NOTE — Progress Notes (Signed)
[facility] - [place] at [facility]   PATIENT NAME: Johnny Lane    MR#:  [id number]  DATE OF BIRTH:  26-Sep-1950  SUBJECTIVE:  CHIEF COMPLAINT:   Chief Complaint  Patient presents with  . Altered Mental Status   -Patient with severe cardiomyopathy, peripheral vascular disease, chronic venous stasis in legs, diabetes mellitus admitted to the floor secondary to CHF exacerbation. Had an asystole/PEA arrest and intubated. Extubated and required bronchoscopy for thick left-sided secretions on Jun 12, 2015. - Appears very ill, dyspneic, nasal cannula in the mouth, thick secretions in throat, unable to cough Appropriately - Apparently, discussion with intensivist- patients code status has been changed to DNR - sitter at bedside as he has expressed wishes to die- confused some today  REVIEW OF SYSTEMS:  Review of Systems  Constitutional: Positive for weight loss and malaise/fatigue.  HENT: Negative for ear discharge, ear pain and tinnitus.   Eyes: Negative for blurred vision and double vision.  Respiratory: Positive for cough, sputum production, shortness of breath and wheezing.   Cardiovascular: Positive for orthopnea and PND.  Gastrointestinal: Negative for nausea, vomiting, abdominal pain, diarrhea and constipation.  Genitourinary: Negative for dysuria.  Musculoskeletal: Negative for myalgias, back pain and neck pain.  Neurological: Negative for speech change, focal weakness and seizures.  Psychiatric/Behavioral: Positive for depression.    DRUG ALLERGIES:  No Known Allergies  VITALS:  Blood pressure 152/63, pulse 79, temperature 97.6 F (36.4 C), temperature source Axillary, resp. rate 22, height 5\' 10"  (1.778 m), weight 84.6 kg (186 lb 8.2 oz), SpO2 97 %.  PHYSICAL EXAMINATION:  Physical Exam  GENERAL:  65 y.o.-year-old chronically ill appearing patient sitting in the bed And appears dyspneic.  EYES: Pupils equal, round, reactive to light and  accommodation. No scleral icterus. Extraocular muscles intact.  HEENT: Head atraumatic, normocephalic. Oropharynx and nasopharynx clear.  NECK:  Supple, no jugular venous distention. No thyroid enlargement, no tenderness.  LUNGS: Moving air bilaterally, coarse rhonchi and scattered expiratory wheezes throughout the lung fields. Decreased bibasilar breath sounds. Using accessory muscles to breathe whenever exerted. CARDIOVASCULAR: S1, S2 normal. No murmurs, rubs, or gallops.  ABDOMEN: Soft, nontender, nondistended. Bowel sounds present. No organomegaly or mass.  EXTREMITIES: Cold to touch hands and feet. Unable to palpate dorsalis pedis pulses. Chronic venous stasis and skin pigmentation changes on both lower extremities. Left lower extremity ulcerations or in dressing. NEUROLOGIC: Cranial nerves II through XII are intact. Moving all extremities. Global weakness noted.. Sensation intact. Gait not checked.  PSYCHIATRIC: The patient is alert and oriented x 2-3.  SKIN: Left leg ulcerations in a dressing , right heel ulcer as well   LABORATORY PANEL:   CBC  Recent Labs Lab 06/07/15 0425  WBC 14.1*  HGB 8.3*  HCT 25.9*  PLT 298   ------------------------------------------------------------------------------------------------------------------  Chemistries   Recent Labs Lab 06/02/15 0402  2015/06/12 0337 06/07/15 0425  NA 144  < > 144 145  K 3.2*  < > 3.6 3.1*  CL 109  < > 106 109  CO2 31  < > 31 33*  GLUCOSE 139*  < > 165* 153*  BUN 47*  < > 35* 33*  CREATININE 1.33*  < > 1.14 0.99  CALCIUM 7.4*  < > 8.3* 8.0*  MG 2.3  --   --   --   AST 742*  --  22  --   ALT 521*  --  77*  --   ALKPHOS 57  --  64  --  BILITOT 1.3*  --  1.0  --   < > = values in this interval not displayed. ------------------------------------------------------------------------------------------------------------------  Cardiac Enzymes No results for input(s): TROPONINI in the last 168  hours. ------------------------------------------------------------------------------------------------------------------  RADIOLOGY:  Dg Chest Port 1 View  06/07/2015  CLINICAL DATA:  Self extubated last night EXAM: PORTABLE CHEST 1 VIEW COMPARISON:  Chest radiograph from one day prior. FINDINGS: Right PICC terminates at the cavoatrial junction. Stable cardiomediastinal silhouette with mild cardiomegaly. No pneumothorax. Low lung volumes. Small bilateral pleural effusions, not definitely changed. Patchy bibasilar lung opacities appear increased bilaterally. Mild pulmonary edema, not definitely changed. IMPRESSION: 1. Low lung volumes. Patchy bibasilar lung opacities appear increased bilaterally, which could represent atelectasis, pneumonia or aspiration. 2. Stable mild cardiomegaly and mild pulmonary edema, suggesting mild congestive heart failure. 3. Small bilateral pleural effusions, not appreciably changed. Electronically Signed   By: Delbert PhenixJason A Poff M.D.   On: 06/07/2015 10:30   Dg Chest Port 1 View  Mar 12, 2015  CLINICAL DATA:  PICC line placement, hypertension, diabetes mellitus, former smoker EXAM: PORTABLE CHEST 1 VIEW COMPARISON:  Portable exam 1618 hours compared to 0901 hours FINDINGS: Tip of endotracheal tube projects 6.7 cm above carina. Nasogastric tube extends into abdomen. New RIGHT arm PICC line with tip projecting over SVC. Enlargement of cardiac silhouette. Bibasilar effusions and atelectasis. Significant improvement in aeration of LEFT lung since previous exam. No pneumothorax. Bones demineralized. IMPRESSION: Tip of RIGHT arm PICC line projects over SVC. Enlargement of cardiac silhouette with bibasilar pleural effusions and atelectasis. Improved aeration since earlier study. Electronically Signed   By: Ulyses SouthwardMark  Boles M.D.   On: 0Feb 22, 2017 16:39    EKG:   Orders placed or performed during the hospital encounter of 05/24/2015  . EKG 12-Lead  . EKG 12-Lead  . EKG 12-Lead  . EKG 12-Lead  .  EKG 12-Lead  . EKG 12-Lead    ASSESSMENT AND PLAN:   65 year old male with past medical history significant for peripheral vascular disease with chronic venous stasis, hypertension, diabetes mellitus who lives at home by himself and followed by a home care nurse, admitted secondary to lower extremity ulcers. Patient had a PEA arrest on the floor and intubated and transferred to ICU.  #1 acute hypoxic respiratory failure-status post PEA arrest.  -Left lung collapse secondary to pneumonia. Status post bronchoscopy and cultures are pending. -Extubated, still on 4l o2, ill appearing, thich secretions, rhonchorous breath sounds - ABG stat today, Wean oxygen as tolerated. Follow-up chest x-ray today. -Appreciate pulmonary consult -Mucinex and flutter valve added -Continue Rocephin for now  #2 sepsis- secondary to lower extremity ulcers. Currently on Rocephin. MRI is negative for any osteomyelitis. No further fevers. Blood cultures negative  #3 atrial fibrillation with rapid ventricular response-well-controlled heart rate. Continue amiodarone. Not an anticoagulation candidate due to anemia  #4 diabetes mellitus- low dose lantus started at 8 units today -On sliding scale insulin  #5 anemia-acute on chronic. Baseline hemoglobin around 9. Received transfusion this admission and hemoglobin is at 8.3. -Continue to monitor  #6 peripheral vascular disease-appreciate vascular consult. Plan is to do angiogram when patient is medically stable. Lower extremity ulcers- appreciate podiatry input. Cont dressing changes- ABX, address after angiogram However not stable enough yet -Poor circulation in both lower extremities with ulcerations noted. Continue Plavix  #7 DVT prophylaxis-on Lovenox  #8 severe cardiomyopathy-systolic dysfunction. EF of 25-30%. -Follow up x-ray and low-dose Lasix for maintenance. -started betablocker, today, add ACEI or ARB if blood pressure allows.  Remains and  appears  critically ill. ABG ordered Palliative care consulted. Overall poor prognosis  All the records are reviewed and case discussed with Care Management/Social Workerr. Management plans discussed with the patient, family and they are in agreement.  CODE STATUS: DO NOT RESUSCITATE as per discussion with the intensivist.   TOTAL CRITICAL CARE TIME SPENT IN TAKING CARE OF THIS PATIENT: 38 minutes.   POSSIBLE D/C IN ? DAYS, DEPENDING ON CLINICAL CONDITION.   Enid Baas M.D on Jul 04, 2015 at 10:18 AM  Between 7am to 6pm - Pager - [phone]  After 6pm go to [email or web address] - password [email or web address] [facility]  [place] [facility]  Office  [phone]  CC: Primary care physician; Leotis Shames, MD

## 2015-06-19 NOTE — Progress Notes (Signed)
Patient was on comfort care, passed away while sister at bedside. No breath sounds, no heart sounds on auscultation Pupils fixed and dilated Pronounced by Dr. Nemiah CommanderKalisetti at 3:45PM

## 2015-06-19 NOTE — Discharge Summary (Signed)
[facility] - [place] at [facility]     Death Note please see Last Note for all details.    Johnny RastDavid Docken CSN:[id number],MRN:[id number] is a 65 y.o. male, Outpatient Primary MD for the patient is [name],[name], MD   65 year old male with past medical history significant for peripheral vascular disease with chronic venous stasis, hypertension, diabetes mellitus who lives at home by himself and followed by a home care nurse, admitted secondary to lower extremity ulcers. Patient had a PEA arrest on the floor and intubated and transferred to ICU. Patient was extubated and then reintubated and had bronchoscopy done for left-sided lung collapse. Patient was extubated after that and transferred to floor on IV antibiotics. However he continued to decline and has clearly expressed that he does not want to be reintubated and wanted to be comfortable. Since his condition worsened, meeting with his POA and friend Mr. [name] and also discussion with patient, he was changed to comfort care on May 13, 2015. Patient passed the same day at 3:45 PM Sister was present at bedside.  Final diagnosis:  #1 acute hypoxic respiratory failure  #2 sepsis #3 pneumonia #4 lower extremity diabetic foot ulcers #5 atrial fibrillation with rapid ventricular response #6 ischemic cardiomyopathy #7 systolic CHF with EF of 20% #8 severe peripheral vascular disease #9 acute on chronic anemia #10 diabetes mellitus  Pronounced dead by Dr. Nemiah CommanderKalisetti on 12/09/15 at 3:45PM                   Cause of death    [name] M.D on 06/09/2015 at 3:30 PM  [facility] - [place] at [facility]    OFFICE [phone]  Total clinical and documentation time for death summary- Under 30 minutes

## 2015-06-19 DEATH — deceased

## 2017-03-28 IMAGING — DX DG CHEST 1V PORT
1 series · 1 of 1 positions shown · non-contrast
Comparison: 05/31/2015

CLINICAL DATA: Acute respiratory failure

EXAM:
PORTABLE CHEST 1 VIEW

[chest ap]
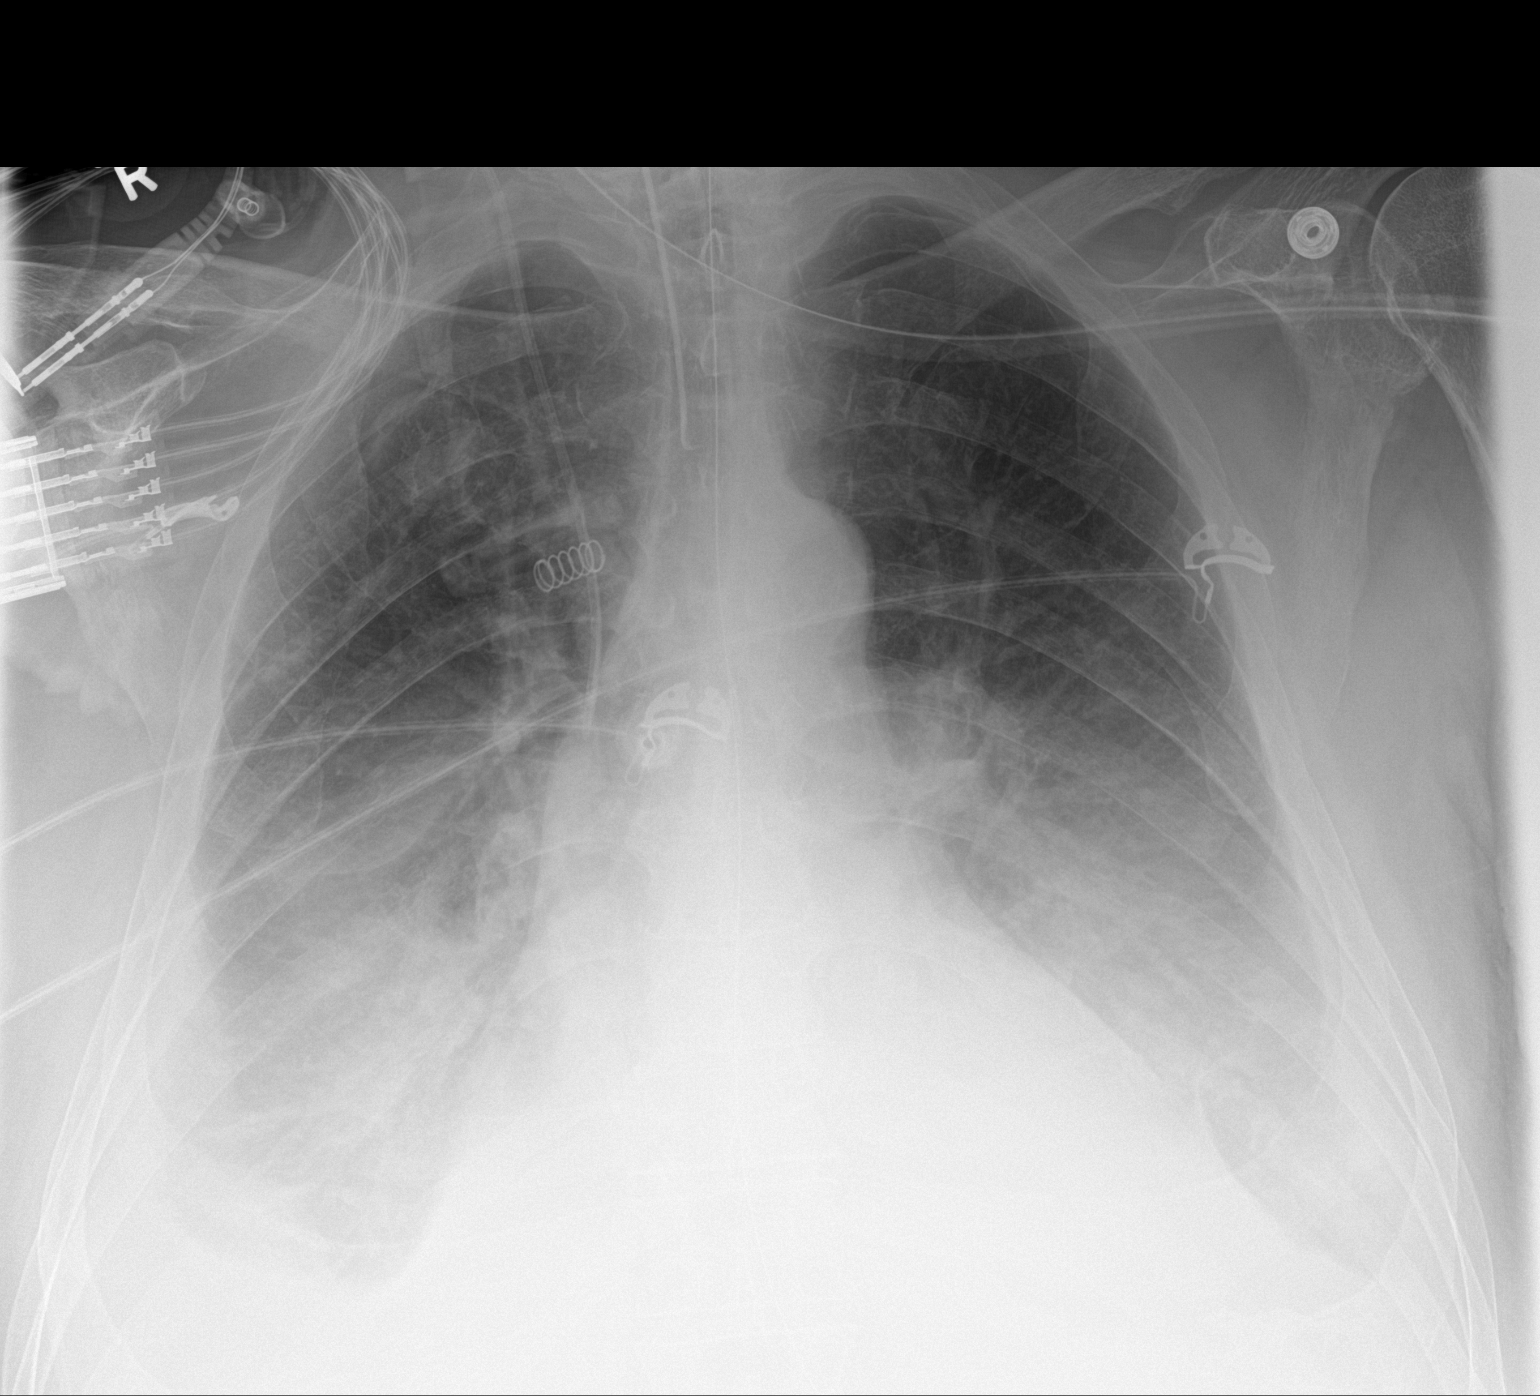

[1 of 1 positions shown; findings below may reference images not displayed]

FINDINGS: Cardiomegaly again noted. Stable endotracheal and NG tube position.
Right IJ central line is unchanged in position. Central mild
vascular congestion and mild interstitial edema bilaterally again
noted. Stable bilateral pleural effusion with bilateral lower lobe
atelectasis or infiltrate.
IMPRESSION: Stable support apparatus.Central mild vascular congestion and mild
interstitial edema bilaterally again noted. Stable bilateral pleural
effusion with bilateral lower lobe atelectasis or infiltrate.

## 2017-03-30 IMAGING — DX DG CHEST 1V PORT
1 series · 1 of 1 positions shown · non-contrast
Comparison: 06/02/2015 .

CLINICAL DATA: Respiratory failure.

EXAM:
PORTABLE CHEST 1 VIEW

[chest ap]
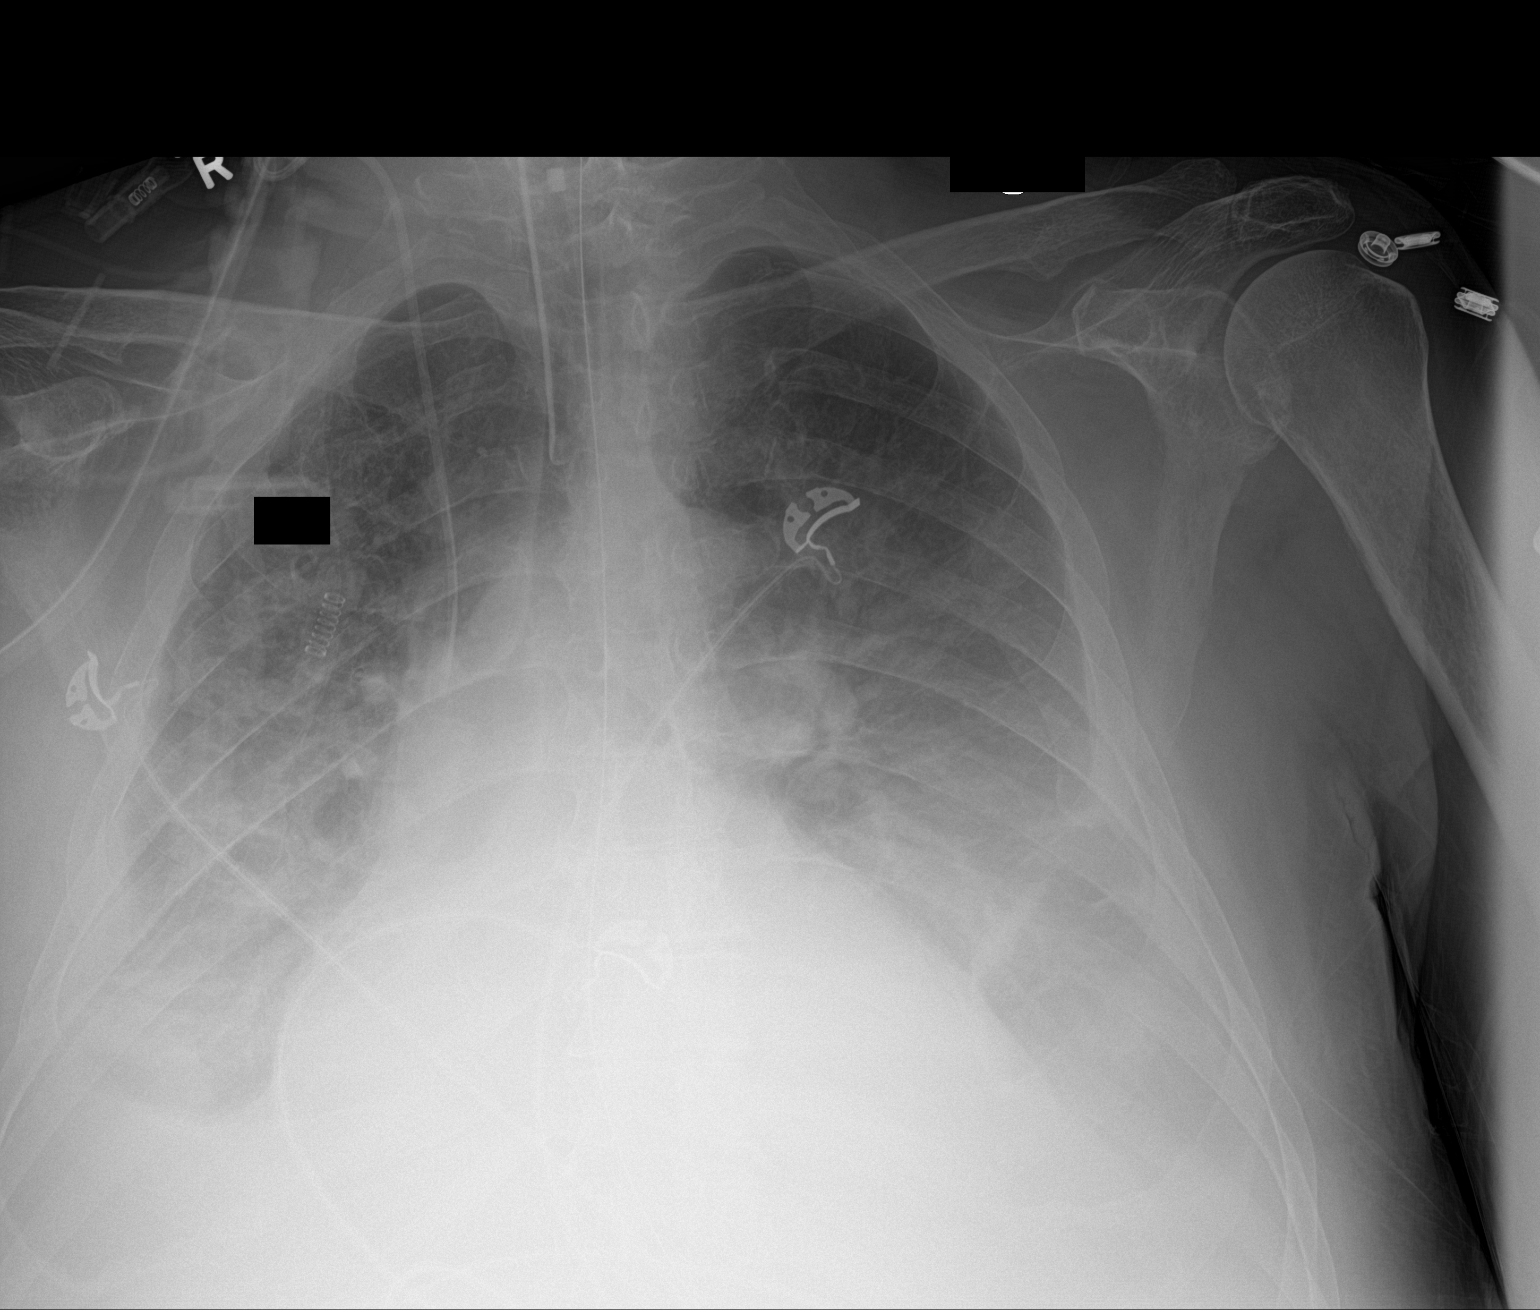

[1 of 1 positions shown; findings below may reference images not displayed]

FINDINGS: Endotracheal tube, NG tube, right IJ line stable position.
Cardiomegaly with diffuse bilateral pulmonary infiltrates and
pleural effusions consistent with congestive heart failure with
pulmonary edema. No pneumothorax.
IMPRESSION: 1. Lines and tubes in stable position.
2. Congestive heart failure with diffuse bilateral pulmonary
infiltrates and bilateral pleural effusions again noted without
significant interim change. Findings consistent congestive heart
failure .

## 2017-04-01 IMAGING — DX DG CHEST 1V PORT
1 series · 1 of 1 positions shown · non-contrast
Comparison: 06/04/2015

CLINICAL DATA: Acute onset of hypoxia. Respiratory failure.
Decreased oxygen saturations.

EXAM:
PORTABLE CHEST 1 VIEW

[chest ap]
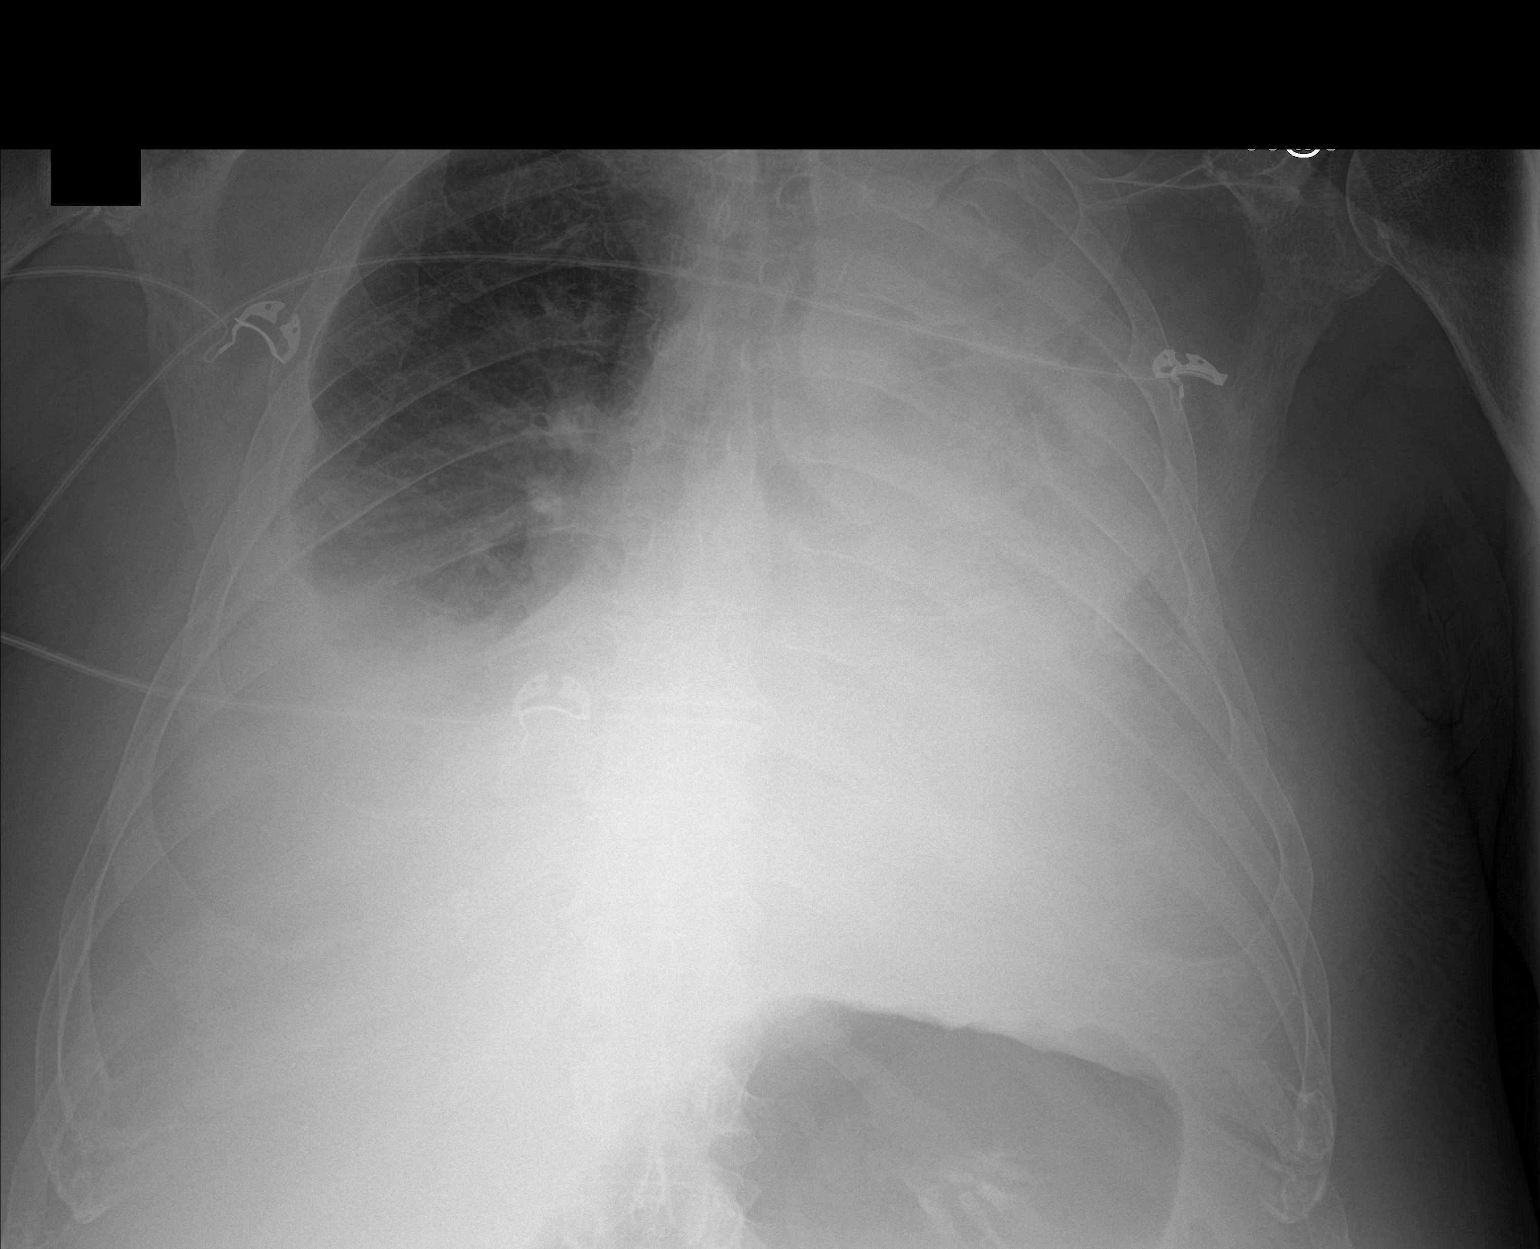

[1 of 1 positions shown; findings below may reference images not displayed]

FINDINGS: Progression of disease in the left lung since prior study. Left hemi
thorax demonstrates near complete white out suggesting increasing
effusion and consolidation. Smaller right pleural effusion and
basilar consolidation is also present, similar to previous study.
Right central venous catheter has been removed. No pneumothorax.
Heart size is obscured by the parenchymal process.
IMPRESSION: Increasing effusion and consolidation in the left lung resulting in
near white out. Smaller right pleural effusion and basilar
consolidation.

## 2017-04-02 IMAGING — DX DG CHEST 1V PORT
1 series · 1 of 1 positions shown · non-contrast
Comparison: Portable exam 0906 hours compared to 3033 hours

CLINICAL DATA: PICC line placement, hypertension, diabetes
mellitus, former smoker

EXAM:
PORTABLE CHEST 1 VIEW

[chest ap]
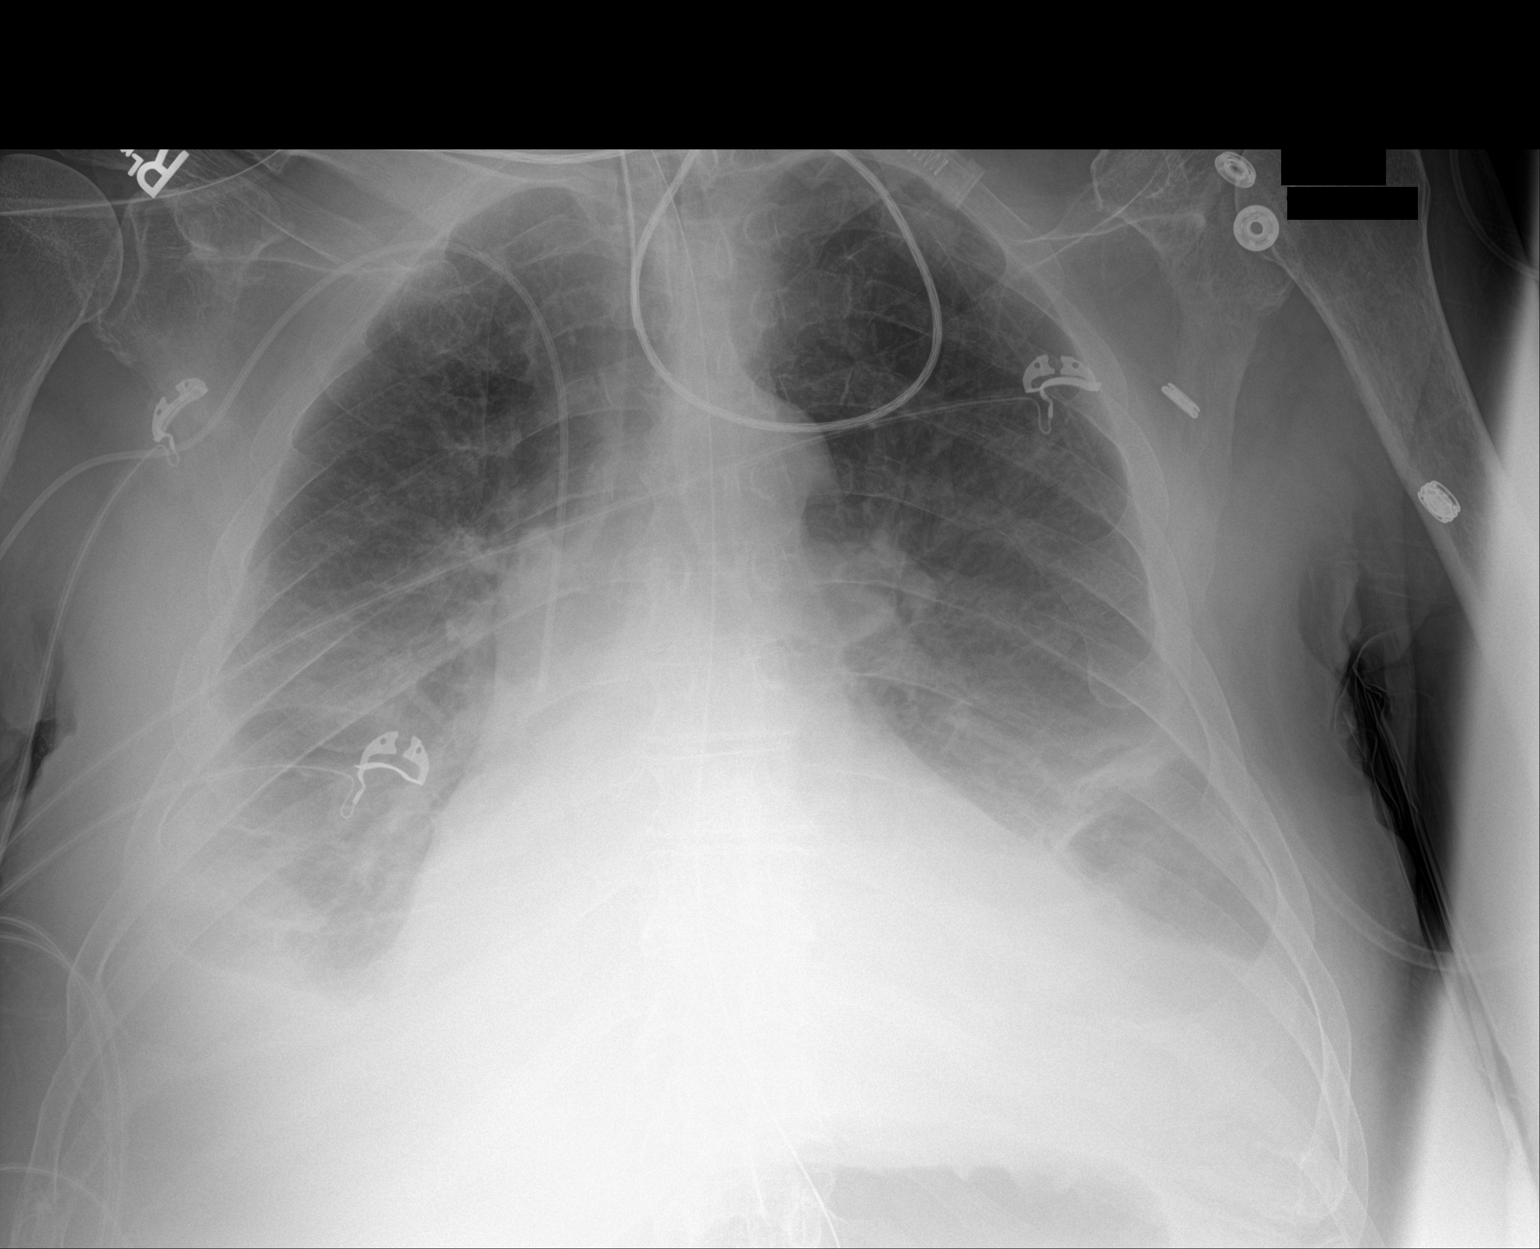

[1 of 1 positions shown; findings below may reference images not displayed]

FINDINGS: Tip of endotracheal tube projects 6.7 cm above carina.

Nasogastric tube extends into abdomen.

New RIGHT arm PICC line with tip projecting over SVC.

Enlargement of cardiac silhouette.

Bibasilar effusions and atelectasis.

Significant improvement in aeration of LEFT lung since previous
exam.

No pneumothorax.

Bones demineralized.
IMPRESSION: Tip of RIGHT arm PICC line projects over SVC.

Enlargement of cardiac silhouette with bibasilar pleural effusions
and atelectasis.

Improved aeration since earlier study.

## 2017-04-02 IMAGING — DX DG CHEST 1V PORT
1 series · 1 of 1 positions shown · non-contrast
Comparison: Radiograph 06/06/2015

CLINICAL DATA: Evaluate endotracheal tube placement

EXAM:
PORTABLE CHEST 1 VIEW

[chest ap]
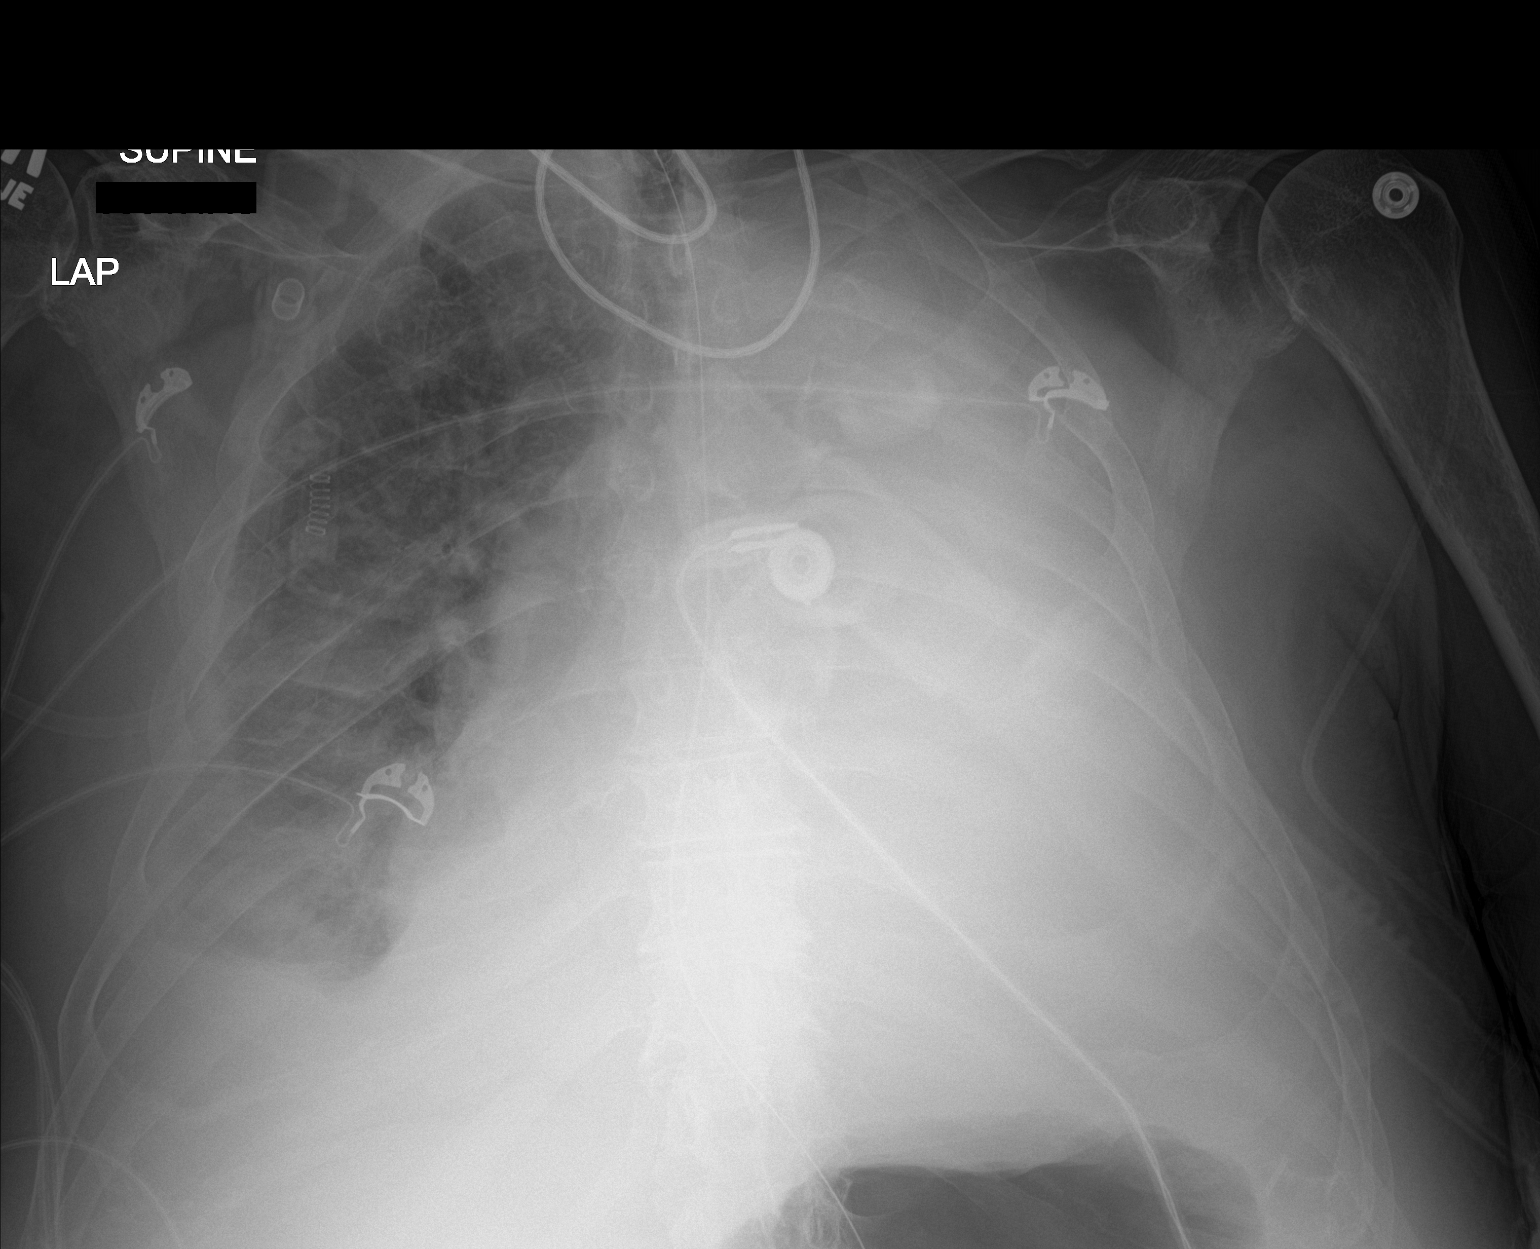

[1 of 1 positions shown; findings below may reference images not displayed]

FINDINGS: Endotracheal tube is positioned approximately 9 cm from the carina.
Endotracheal tube is in the high thoracic inlet. NG tube extends the
stomach.

Near complete opacification of the LEFT hemi thorax. Enlarged
cardiac silhouette. RIGHT pleural effusion. Vascular congestion the
RIGHT lung.
IMPRESSION: 1. Endotracheal tube is slightly high in the thoracic inlet
measuring 9.5 cm from carina. Consider advancing 2 to 3 cm.
2. NG tube in stomach.
3. No significant change in near complete opacification of the LEFT
hemi thorax.

## 2020-07-18 DEATH — deceased
